# Patient Record
Sex: Female | Born: 1935
Health system: Southern US, Community
[De-identification: ages and names within clinical notes are randomized; demographics above are authoritative.]

## PROBLEM LIST (undated history)

## (undated) DIAGNOSIS — Z87898 Personal history of other specified conditions: Secondary | ICD-10-CM

## (undated) DIAGNOSIS — I10 Essential (primary) hypertension: Secondary | ICD-10-CM

## (undated) DIAGNOSIS — D696 Thrombocytopenia, unspecified: Secondary | ICD-10-CM

## (undated) DIAGNOSIS — N200 Calculus of kidney: Secondary | ICD-10-CM

## (undated) DIAGNOSIS — C8408 Mycosis fungoides, lymph nodes of multiple sites: Secondary | ICD-10-CM

## (undated) DIAGNOSIS — K2270 Barrett's esophagus without dysplasia: Secondary | ICD-10-CM

## (undated) DIAGNOSIS — D497 Neoplasm of unspecified behavior of endocrine glands and other parts of nervous system: Secondary | ICD-10-CM

## (undated) DIAGNOSIS — L89309 Pressure ulcer of unspecified buttock, unspecified stage: Secondary | ICD-10-CM

## (undated) DIAGNOSIS — H269 Unspecified cataract: Secondary | ICD-10-CM

## (undated) DIAGNOSIS — E785 Hyperlipidemia, unspecified: Secondary | ICD-10-CM

## (undated) HISTORY — DX: Essential (primary) hypertension: I10

## (undated) HISTORY — DX: Thrombocytopenia, unspecified: D69.6

## (undated) HISTORY — DX: Hyperlipidemia, unspecified: E78.5

## (undated) HISTORY — DX: Calculus of kidney: N20.0

## (undated) HISTORY — DX: Barrett's esophagus without dysplasia: K22.70

## (undated) HISTORY — DX: Personal history of other specified conditions: Z87.898

## (undated) HISTORY — PX: TOTAL KNEE ARTHROPLASTY: SHX125

## (undated) HISTORY — PX: OTHER SURGICAL HISTORY: SHX169

## (undated) HISTORY — PX: TONSILLECTOMY: SUR1361

## (undated) HISTORY — DX: Unspecified cataract: H26.9

## (undated) HISTORY — DX: Pressure ulcer of unspecified buttock, unspecified stage: L89.309

## (undated) HISTORY — DX: Neoplasm of unspecified behavior of endocrine glands and other parts of nervous system: D49.7

## (undated) HISTORY — PX: ABDOMINAL HYSTERECTOMY: SHX81

## (undated) HISTORY — DX: Mycosis fungoides, lymph nodes of multiple sites: C84.08

---

## 1997-11-07 DIAGNOSIS — K2270 Barrett's esophagus without dysplasia: Secondary | ICD-10-CM

## 1997-11-07 HISTORY — DX: Barrett's esophagus without dysplasia: K22.70

## 1997-12-29 ENCOUNTER — Ambulatory Visit (HOSPITAL_COMMUNITY): Admission: RE | Admit: 1997-12-29 | Discharge: 1997-12-29 | Payer: Self-pay

## 1998-03-04 ENCOUNTER — Inpatient Hospital Stay (HOSPITAL_COMMUNITY): Admission: RE | Admit: 1998-03-04 | Discharge: 1998-03-10 | Payer: Self-pay | Admitting: Orthopedic Surgery

## 1998-03-10 ENCOUNTER — Inpatient Hospital Stay (HOSPITAL_COMMUNITY)
Admission: RE | Admit: 1998-03-10 | Discharge: 1998-03-20 | Payer: Self-pay | Admitting: Physical Medicine and Rehabilitation

## 1998-04-20 ENCOUNTER — Encounter: Admission: RE | Admit: 1998-04-20 | Discharge: 1998-07-19 | Payer: Self-pay | Admitting: Orthopedic Surgery

## 1998-05-28 ENCOUNTER — Ambulatory Visit (HOSPITAL_COMMUNITY): Admission: RE | Admit: 1998-05-28 | Discharge: 1998-05-28 | Payer: Self-pay | Admitting: *Deleted

## 1998-12-20 ENCOUNTER — Emergency Department (HOSPITAL_COMMUNITY): Admission: EM | Admit: 1998-12-20 | Discharge: 1998-12-20 | Payer: Self-pay | Admitting: Emergency Medicine

## 1998-12-20 ENCOUNTER — Encounter: Payer: Self-pay | Admitting: *Deleted

## 1999-02-25 ENCOUNTER — Ambulatory Visit (HOSPITAL_COMMUNITY): Admission: RE | Admit: 1999-02-25 | Discharge: 1999-02-25 | Payer: Self-pay

## 1999-05-31 ENCOUNTER — Encounter (INDEPENDENT_AMBULATORY_CARE_PROVIDER_SITE_OTHER): Payer: Self-pay

## 1999-05-31 ENCOUNTER — Ambulatory Visit (HOSPITAL_COMMUNITY): Admission: RE | Admit: 1999-05-31 | Discharge: 1999-05-31 | Payer: Self-pay | Admitting: *Deleted

## 2000-01-13 ENCOUNTER — Ambulatory Visit (HOSPITAL_BASED_OUTPATIENT_CLINIC_OR_DEPARTMENT_OTHER): Admission: RE | Admit: 2000-01-13 | Discharge: 2000-01-13 | Payer: Self-pay | Admitting: Orthopedic Surgery

## 2000-03-14 ENCOUNTER — Ambulatory Visit (HOSPITAL_COMMUNITY): Admission: RE | Admit: 2000-03-14 | Discharge: 2000-03-14 | Payer: Self-pay | Admitting: Orthopedic Surgery

## 2000-03-24 ENCOUNTER — Encounter: Admission: RE | Admit: 2000-03-24 | Discharge: 2000-03-24 | Payer: Self-pay | Admitting: *Deleted

## 2000-03-24 ENCOUNTER — Encounter: Admission: RE | Admit: 2000-03-24 | Discharge: 2000-03-24 | Payer: Self-pay | Admitting: Orthopedic Surgery

## 2000-03-24 ENCOUNTER — Encounter: Payer: Self-pay | Admitting: Orthopedic Surgery

## 2000-03-24 ENCOUNTER — Encounter: Payer: Self-pay | Admitting: *Deleted

## 2000-04-03 ENCOUNTER — Encounter: Payer: Self-pay | Admitting: Obstetrics and Gynecology

## 2000-04-03 ENCOUNTER — Encounter: Admission: RE | Admit: 2000-04-03 | Discharge: 2000-04-03 | Payer: Self-pay | Admitting: Obstetrics and Gynecology

## 2000-04-12 ENCOUNTER — Encounter (INDEPENDENT_AMBULATORY_CARE_PROVIDER_SITE_OTHER): Payer: Self-pay | Admitting: Specialist

## 2000-04-12 ENCOUNTER — Ambulatory Visit (HOSPITAL_COMMUNITY): Admission: RE | Admit: 2000-04-12 | Discharge: 2000-04-12 | Payer: Self-pay | Admitting: *Deleted

## 2000-11-07 DIAGNOSIS — D497 Neoplasm of unspecified behavior of endocrine glands and other parts of nervous system: Secondary | ICD-10-CM

## 2000-11-07 HISTORY — DX: Neoplasm of unspecified behavior of endocrine glands and other parts of nervous system: D49.7

## 2001-03-19 ENCOUNTER — Ambulatory Visit (HOSPITAL_COMMUNITY): Admission: RE | Admit: 2001-03-19 | Discharge: 2001-03-19 | Payer: Self-pay

## 2002-03-07 ENCOUNTER — Ambulatory Visit (HOSPITAL_COMMUNITY): Admission: RE | Admit: 2002-03-07 | Discharge: 2002-03-07 | Payer: Self-pay | Admitting: *Deleted

## 2002-03-07 ENCOUNTER — Encounter (INDEPENDENT_AMBULATORY_CARE_PROVIDER_SITE_OTHER): Payer: Self-pay | Admitting: Specialist

## 2002-03-07 HISTORY — PX: ESOPHAGOGASTRODUODENOSCOPY: SHX1529

## 2002-03-21 ENCOUNTER — Ambulatory Visit (HOSPITAL_COMMUNITY): Admission: RE | Admit: 2002-03-21 | Discharge: 2002-03-21 | Payer: Self-pay | Admitting: *Deleted

## 2002-05-03 ENCOUNTER — Encounter: Payer: Self-pay | Admitting: Internal Medicine

## 2002-05-03 ENCOUNTER — Encounter: Admission: RE | Admit: 2002-05-03 | Discharge: 2002-05-03 | Payer: Self-pay | Admitting: Internal Medicine

## 2002-05-13 ENCOUNTER — Ambulatory Visit (HOSPITAL_BASED_OUTPATIENT_CLINIC_OR_DEPARTMENT_OTHER): Admission: RE | Admit: 2002-05-13 | Discharge: 2002-05-13 | Payer: Self-pay | Admitting: *Deleted

## 2002-06-06 ENCOUNTER — Encounter: Admission: RE | Admit: 2002-06-06 | Discharge: 2002-09-04 | Payer: Self-pay | Admitting: Internal Medicine

## 2002-09-18 ENCOUNTER — Encounter: Admission: RE | Admit: 2002-09-18 | Discharge: 2002-12-17 | Payer: Self-pay | Admitting: Internal Medicine

## 2002-12-17 ENCOUNTER — Encounter: Admission: RE | Admit: 2002-12-17 | Discharge: 2003-03-17 | Payer: Self-pay

## 2003-01-13 ENCOUNTER — Encounter: Payer: Self-pay | Admitting: Family Medicine

## 2003-01-13 ENCOUNTER — Encounter: Admission: RE | Admit: 2003-01-13 | Discharge: 2003-01-13 | Payer: Self-pay | Admitting: Family Medicine

## 2003-02-11 ENCOUNTER — Encounter (INDEPENDENT_AMBULATORY_CARE_PROVIDER_SITE_OTHER): Payer: Self-pay | Admitting: Specialist

## 2003-02-11 ENCOUNTER — Ambulatory Visit (HOSPITAL_COMMUNITY): Admission: RE | Admit: 2003-02-11 | Discharge: 2003-02-11 | Payer: Self-pay | Admitting: *Deleted

## 2003-03-28 ENCOUNTER — Ambulatory Visit (HOSPITAL_COMMUNITY): Admission: RE | Admit: 2003-03-28 | Discharge: 2003-03-28 | Payer: Self-pay | Admitting: *Deleted

## 2003-09-23 ENCOUNTER — Ambulatory Visit (HOSPITAL_COMMUNITY): Admission: RE | Admit: 2003-09-23 | Discharge: 2003-09-23 | Payer: Self-pay | Admitting: Emergency Medicine

## 2003-10-07 ENCOUNTER — Ambulatory Visit (HOSPITAL_COMMUNITY): Admission: RE | Admit: 2003-10-07 | Discharge: 2003-10-07 | Payer: Self-pay | Admitting: Emergency Medicine

## 2004-03-15 ENCOUNTER — Ambulatory Visit (HOSPITAL_COMMUNITY): Admission: RE | Admit: 2004-03-15 | Discharge: 2004-03-15 | Payer: Self-pay | Admitting: *Deleted

## 2004-04-23 ENCOUNTER — Encounter: Admission: RE | Admit: 2004-04-23 | Discharge: 2004-04-23 | Payer: Self-pay | Admitting: Family Medicine

## 2004-05-11 ENCOUNTER — Inpatient Hospital Stay (HOSPITAL_BASED_OUTPATIENT_CLINIC_OR_DEPARTMENT_OTHER): Admission: RE | Admit: 2004-05-11 | Discharge: 2004-05-11 | Payer: Self-pay | Admitting: Cardiovascular Disease

## 2004-07-22 ENCOUNTER — Inpatient Hospital Stay (HOSPITAL_COMMUNITY): Admission: RE | Admit: 2004-07-22 | Discharge: 2004-07-30 | Payer: Self-pay | Admitting: Orthopedic Surgery

## 2004-07-30 ENCOUNTER — Ambulatory Visit: Payer: Self-pay | Admitting: Physical Medicine & Rehabilitation

## 2004-07-30 ENCOUNTER — Inpatient Hospital Stay
Admission: RE | Admit: 2004-07-30 | Discharge: 2004-08-11 | Payer: Self-pay | Admitting: Physical Medicine & Rehabilitation

## 2004-09-20 ENCOUNTER — Ambulatory Visit: Payer: Self-pay | Admitting: Family Medicine

## 2004-09-21 ENCOUNTER — Ambulatory Visit: Payer: Self-pay | Admitting: Family Medicine

## 2004-09-27 ENCOUNTER — Ambulatory Visit: Payer: Self-pay | Admitting: Family Medicine

## 2004-09-29 ENCOUNTER — Encounter: Admission: RE | Admit: 2004-09-29 | Discharge: 2004-11-05 | Payer: Self-pay | Admitting: Orthopedic Surgery

## 2004-10-14 ENCOUNTER — Ambulatory Visit: Payer: Self-pay | Admitting: Family Medicine

## 2004-11-03 ENCOUNTER — Ambulatory Visit: Payer: Self-pay | Admitting: Family Medicine

## 2005-01-11 ENCOUNTER — Ambulatory Visit: Payer: Self-pay | Admitting: Family Medicine

## 2005-05-11 ENCOUNTER — Ambulatory Visit: Payer: Self-pay | Admitting: Family Medicine

## 2005-05-23 ENCOUNTER — Ambulatory Visit (HOSPITAL_COMMUNITY): Admission: RE | Admit: 2005-05-23 | Discharge: 2005-05-23 | Payer: Self-pay | Admitting: Family Medicine

## 2005-06-02 ENCOUNTER — Encounter: Admission: RE | Admit: 2005-06-02 | Discharge: 2005-06-02 | Payer: Self-pay | Admitting: Family Medicine

## 2005-07-21 ENCOUNTER — Ambulatory Visit: Payer: Self-pay | Admitting: Family Medicine

## 2005-08-30 ENCOUNTER — Ambulatory Visit: Payer: Self-pay | Admitting: Family Medicine

## 2005-09-15 ENCOUNTER — Ambulatory Visit: Payer: Self-pay | Admitting: Family Medicine

## 2005-09-19 ENCOUNTER — Ambulatory Visit: Payer: Self-pay | Admitting: Physical Medicine & Rehabilitation

## 2005-09-19 ENCOUNTER — Ambulatory Visit: Payer: Self-pay | Admitting: Infectious Diseases

## 2005-09-19 ENCOUNTER — Inpatient Hospital Stay (HOSPITAL_COMMUNITY): Admission: RE | Admit: 2005-09-19 | Discharge: 2005-10-03 | Payer: Self-pay | Admitting: Orthopedic Surgery

## 2005-10-07 ENCOUNTER — Ambulatory Visit: Payer: Self-pay | Admitting: Family Medicine

## 2005-10-27 ENCOUNTER — Ambulatory Visit: Payer: Self-pay | Admitting: Family Medicine

## 2006-01-05 ENCOUNTER — Ambulatory Visit: Payer: Self-pay | Admitting: Infectious Diseases

## 2006-01-30 ENCOUNTER — Ambulatory Visit: Payer: Self-pay | Admitting: Infectious Diseases

## 2006-08-08 ENCOUNTER — Ambulatory Visit: Payer: Self-pay | Admitting: Family Medicine

## 2006-08-21 ENCOUNTER — Ambulatory Visit: Payer: Self-pay | Admitting: Family Medicine

## 2007-07-26 ENCOUNTER — Telehealth: Payer: Self-pay | Admitting: Family Medicine

## 2007-09-25 ENCOUNTER — Telehealth (INDEPENDENT_AMBULATORY_CARE_PROVIDER_SITE_OTHER): Payer: Self-pay | Admitting: *Deleted

## 2007-10-02 ENCOUNTER — Telehealth (INDEPENDENT_AMBULATORY_CARE_PROVIDER_SITE_OTHER): Payer: Self-pay | Admitting: *Deleted

## 2008-02-12 ENCOUNTER — Ambulatory Visit: Payer: Self-pay | Admitting: Family Medicine

## 2008-02-12 DIAGNOSIS — E785 Hyperlipidemia, unspecified: Secondary | ICD-10-CM

## 2008-02-12 DIAGNOSIS — I110 Hypertensive heart disease with heart failure: Secondary | ICD-10-CM

## 2008-02-17 ENCOUNTER — Encounter (INDEPENDENT_AMBULATORY_CARE_PROVIDER_SITE_OTHER): Payer: Self-pay | Admitting: *Deleted

## 2008-03-25 ENCOUNTER — Encounter: Payer: Self-pay | Admitting: Family Medicine

## 2008-04-03 ENCOUNTER — Ambulatory Visit (HOSPITAL_COMMUNITY): Admission: RE | Admit: 2008-04-03 | Discharge: 2008-04-03 | Payer: Self-pay | Admitting: *Deleted

## 2008-07-01 ENCOUNTER — Telehealth (INDEPENDENT_AMBULATORY_CARE_PROVIDER_SITE_OTHER): Payer: Self-pay | Admitting: *Deleted

## 2008-07-04 ENCOUNTER — Telehealth: Payer: Self-pay | Admitting: Family Medicine

## 2008-07-04 ENCOUNTER — Encounter (INDEPENDENT_AMBULATORY_CARE_PROVIDER_SITE_OTHER): Payer: Self-pay | Admitting: *Deleted

## 2008-11-04 ENCOUNTER — Telehealth (INDEPENDENT_AMBULATORY_CARE_PROVIDER_SITE_OTHER): Payer: Self-pay | Admitting: *Deleted

## 2008-11-10 ENCOUNTER — Telehealth (INDEPENDENT_AMBULATORY_CARE_PROVIDER_SITE_OTHER): Payer: Self-pay | Admitting: *Deleted

## 2009-01-14 ENCOUNTER — Telehealth (INDEPENDENT_AMBULATORY_CARE_PROVIDER_SITE_OTHER): Payer: Self-pay | Admitting: *Deleted

## 2009-01-16 ENCOUNTER — Ambulatory Visit: Payer: Self-pay | Admitting: Family Medicine

## 2009-01-16 DIAGNOSIS — N393 Stress incontinence (female) (male): Secondary | ICD-10-CM | POA: Insufficient documentation

## 2009-01-20 ENCOUNTER — Ambulatory Visit: Payer: Self-pay | Admitting: Family Medicine

## 2009-01-20 LAB — CONVERTED CEMR LAB
AST: 20 units/L (ref 0–37)
Bilirubin, Direct: 0 mg/dL (ref 0.0–0.3)
Cholesterol: 121 mg/dL (ref 0–200)
LDL Cholesterol: 61 mg/dL (ref 0–99)
Total Protein: 7.2 g/dL (ref 6.0–8.3)
VLDL: 20.4 mg/dL (ref 0.0–40.0)

## 2009-01-21 ENCOUNTER — Telehealth: Payer: Self-pay | Admitting: Family Medicine

## 2009-01-21 ENCOUNTER — Encounter (INDEPENDENT_AMBULATORY_CARE_PROVIDER_SITE_OTHER): Payer: Self-pay | Admitting: *Deleted

## 2009-02-10 ENCOUNTER — Telehealth (INDEPENDENT_AMBULATORY_CARE_PROVIDER_SITE_OTHER): Payer: Self-pay | Admitting: *Deleted

## 2009-02-12 ENCOUNTER — Encounter: Payer: Self-pay | Admitting: Family Medicine

## 2009-03-05 ENCOUNTER — Telehealth (INDEPENDENT_AMBULATORY_CARE_PROVIDER_SITE_OTHER): Payer: Self-pay | Admitting: *Deleted

## 2009-03-11 ENCOUNTER — Encounter: Payer: Self-pay | Admitting: Family Medicine

## 2009-03-27 ENCOUNTER — Encounter: Payer: Self-pay | Admitting: Family Medicine

## 2009-04-23 ENCOUNTER — Encounter: Payer: Self-pay | Admitting: Family Medicine

## 2009-05-28 ENCOUNTER — Telehealth (INDEPENDENT_AMBULATORY_CARE_PROVIDER_SITE_OTHER): Payer: Self-pay | Admitting: *Deleted

## 2009-09-15 ENCOUNTER — Encounter: Payer: Self-pay | Admitting: Family Medicine

## 2009-09-21 ENCOUNTER — Ambulatory Visit: Payer: Self-pay | Admitting: Family Medicine

## 2009-09-21 DIAGNOSIS — M25519 Pain in unspecified shoulder: Secondary | ICD-10-CM | POA: Insufficient documentation

## 2009-09-21 DIAGNOSIS — C8408 Mycosis fungoides, lymph nodes of multiple sites: Secondary | ICD-10-CM

## 2009-09-21 DIAGNOSIS — M1710 Unilateral primary osteoarthritis, unspecified knee: Secondary | ICD-10-CM

## 2009-09-21 HISTORY — DX: Mycosis fungoides, lymph nodes of multiple sites: C84.08

## 2009-09-28 ENCOUNTER — Ambulatory Visit: Payer: Self-pay | Admitting: Family Medicine

## 2009-09-28 LAB — CONVERTED CEMR LAB
OCCULT 1: NEGATIVE
OCCULT 2: NEGATIVE

## 2009-10-07 ENCOUNTER — Telehealth (INDEPENDENT_AMBULATORY_CARE_PROVIDER_SITE_OTHER): Payer: Self-pay | Admitting: *Deleted

## 2009-10-12 ENCOUNTER — Ambulatory Visit: Payer: Self-pay | Admitting: Family Medicine

## 2009-10-14 ENCOUNTER — Encounter (INDEPENDENT_AMBULATORY_CARE_PROVIDER_SITE_OTHER): Payer: Self-pay | Admitting: *Deleted

## 2009-10-14 LAB — CONVERTED CEMR LAB
Eosinophils Absolute: 0.1 10*3/uL (ref 0.0–0.7)
Eosinophils Relative: 1.9 % (ref 0.0–5.0)
Hemoglobin: 14.2 g/dL (ref 12.0–15.0)
Lymphs Abs: 1.6 10*3/uL (ref 0.7–4.0)
MCHC: 33.5 g/dL (ref 30.0–36.0)
Monocytes Relative: 3 % (ref 3.0–12.0)
Neutro Abs: 4.3 10*3/uL (ref 1.4–7.7)
Neutrophils Relative %: 69.3 % (ref 43.0–77.0)
Platelets: 138 10*3/uL — ABNORMAL LOW (ref 150.0–400.0)

## 2009-12-21 ENCOUNTER — Ambulatory Visit: Payer: Self-pay | Admitting: Family Medicine

## 2009-12-21 DIAGNOSIS — D696 Thrombocytopenia, unspecified: Secondary | ICD-10-CM | POA: Insufficient documentation

## 2009-12-21 DIAGNOSIS — L89309 Pressure ulcer of unspecified buttock, unspecified stage: Secondary | ICD-10-CM

## 2009-12-21 DIAGNOSIS — B370 Candidal stomatitis: Secondary | ICD-10-CM

## 2009-12-21 HISTORY — DX: Thrombocytopenia, unspecified: D69.6

## 2009-12-21 HISTORY — DX: Pressure ulcer of unspecified buttock, unspecified stage: L89.309

## 2009-12-22 ENCOUNTER — Telehealth (INDEPENDENT_AMBULATORY_CARE_PROVIDER_SITE_OTHER): Payer: Self-pay | Admitting: *Deleted

## 2009-12-23 LAB — CONVERTED CEMR LAB
Basophils Absolute: 0.2 10*3/uL — ABNORMAL HIGH (ref 0.0–0.1)
Eosinophils Absolute: 0.2 10*3/uL (ref 0.0–0.7)
Eosinophils Relative: 3.5 % (ref 0.0–5.0)
Hemoglobin: 14.1 g/dL (ref 12.0–15.0)
Lymphs Abs: 1.4 10*3/uL (ref 0.7–4.0)
Monocytes Absolute: 0.3 10*3/uL (ref 0.1–1.0)
RBC: 4.4 M/uL (ref 3.87–5.11)
WBC: 4.8 10*3/uL (ref 4.5–10.5)

## 2009-12-24 ENCOUNTER — Encounter (HOSPITAL_BASED_OUTPATIENT_CLINIC_OR_DEPARTMENT_OTHER): Admission: RE | Admit: 2009-12-24 | Discharge: 2010-01-12 | Payer: Self-pay | Admitting: Internal Medicine

## 2009-12-28 ENCOUNTER — Ambulatory Visit: Payer: Self-pay | Admitting: Hematology & Oncology

## 2010-01-18 ENCOUNTER — Encounter: Payer: Self-pay | Admitting: Family Medicine

## 2010-01-18 LAB — CBC WITH DIFFERENTIAL (CANCER CENTER ONLY)
BASO#: 0 10*3/uL (ref 0.0–0.2)
BASO%: 0.7 % (ref 0.0–2.0)
EOS%: 3.5 % (ref 0.0–7.0)
Eosinophils Absolute: 0.2 10*3/uL (ref 0.0–0.5)
HCT: 41.9 % (ref 34.8–46.6)
LYMPH#: 1.5 10*3/uL (ref 0.9–3.3)
LYMPH%: 31.3 % (ref 14.0–48.0)
MCHC: 32.6 g/dL (ref 32.0–36.0)
MCV: 97 fL (ref 81–101)
MONO#: 0.5 10*3/uL (ref 0.1–0.9)
NEUT%: 53.5 % (ref 39.6–80.0)
Platelets: 131 10*3/uL — ABNORMAL LOW (ref 145–400)
RDW: 11.3 % (ref 10.5–14.6)

## 2010-01-18 LAB — CHCC SATELLITE - SMEAR

## 2010-01-20 LAB — VITAMIN B12: Vitamin B-12: 1037 pg/mL — ABNORMAL HIGH (ref 211–911)

## 2010-01-20 LAB — PROTEIN ELECTROPHORESIS, SERUM
Beta Globulin: 6.4 % (ref 4.7–7.2)
Gamma Globulin: 17 % (ref 11.1–18.8)

## 2010-07-13 ENCOUNTER — Ambulatory Visit: Payer: Self-pay | Admitting: Hematology & Oncology

## 2010-07-15 ENCOUNTER — Encounter: Payer: Self-pay | Admitting: Family Medicine

## 2010-07-15 LAB — CBC WITH DIFFERENTIAL (CANCER CENTER ONLY)
BASO#: 0.1 10*3/uL (ref 0.0–0.2)
HGB: 14.5 g/dL (ref 11.6–15.9)
LYMPH%: 29.9 % (ref 14.0–48.0)
NEUT#: 3.5 10*3/uL (ref 1.5–6.5)
WBC: 6.4 10*3/uL (ref 3.9–10.0)

## 2010-07-15 LAB — CHCC SATELLITE - SMEAR

## 2010-09-23 ENCOUNTER — Ambulatory Visit: Payer: Self-pay | Admitting: Family Medicine

## 2010-09-23 ENCOUNTER — Encounter: Payer: Self-pay | Admitting: Family Medicine

## 2010-09-23 LAB — CONVERTED CEMR LAB
Bilirubin Urine: NEGATIVE
Blood in Urine, dipstick: NEGATIVE
Glucose, Urine, Semiquant: NEGATIVE
Ketones, urine, test strip: NEGATIVE
Urobilinogen, UA: NEGATIVE
pH: 6.5

## 2010-09-27 LAB — CONVERTED CEMR LAB
ALT: 18 units/L (ref 0–35)
AST: 21 units/L (ref 0–37)
Alkaline Phosphatase: 52 units/L (ref 39–117)
BUN: 19 mg/dL (ref 6–23)
Basophils Relative: 0.9 % (ref 0.0–3.0)
Bilirubin, Direct: 0.1 mg/dL (ref 0.0–0.3)
Calcium: 9 mg/dL (ref 8.4–10.5)
Creatinine, Ser: 0.7 mg/dL (ref 0.4–1.2)
Eosinophils Absolute: 0.1 10*3/uL (ref 0.0–0.7)
Eosinophils Relative: 2.6 % (ref 0.0–5.0)
GFR calc non Af Amer: 85.97 mL/min (ref >60)
Glucose, Bld: 63 mg/dL — ABNORMAL LOW (ref 70–99)
HCT: 41.5 % (ref 36.0–46.0)
HDL: 46.8 mg/dL (ref >39.00)
Lymphocytes Relative: 28.6 % (ref 12.0–46.0)
Lymphs Abs: 1.6 10*3/uL (ref 0.7–4.0)
Neutro Abs: 3.1 10*3/uL (ref 1.4–7.7)
Platelets: 152 10*3/uL (ref 150.0–400.0)
Potassium: 3.6 meq/L (ref 3.5–5.1)
RBC: 4.25 M/uL (ref 3.87–5.11)
Sodium: 141 meq/L (ref 135–145)
Total CHOL/HDL Ratio: 3
Total Protein: 6.6 g/dL (ref 6.0–8.3)

## 2010-11-11 ENCOUNTER — Ambulatory Visit
Admission: RE | Admit: 2010-11-11 | Discharge: 2010-11-11 | Payer: Self-pay | Source: Home / Self Care | Attending: Family Medicine | Admitting: Family Medicine

## 2010-11-11 DIAGNOSIS — J019 Acute sinusitis, unspecified: Secondary | ICD-10-CM | POA: Insufficient documentation

## 2010-11-25 ENCOUNTER — Telehealth (INDEPENDENT_AMBULATORY_CARE_PROVIDER_SITE_OTHER): Payer: Self-pay | Admitting: *Deleted

## 2010-11-28 ENCOUNTER — Encounter: Payer: Self-pay | Admitting: Family Medicine

## 2010-12-05 LAB — CONVERTED CEMR LAB
ALT: 17 units/L (ref 0–35)
ALT: 19 units/L (ref 0–35)
AST: 21 units/L (ref 0–37)
AST: 21 units/L (ref 0–37)
Alkaline Phosphatase: 56 units/L (ref 39–117)
BUN: 10 mg/dL (ref 6–23)
BUN: 19 mg/dL (ref 6–23)
Basophils Absolute: 0 10*3/uL (ref 0.0–0.1)
Bilirubin Urine: NEGATIVE
Bilirubin, Direct: 0 mg/dL (ref 0.0–0.3)
Bilirubin, Direct: 0.1 mg/dL (ref 0.0–0.3)
CO2: 32 meq/L (ref 19–32)
Calcium: 9.2 mg/dL (ref 8.4–10.5)
Chloride: 99 meq/L (ref 96–112)
Creatinine, Ser: 0.8 mg/dL (ref 0.4–1.2)
Eosinophils Absolute: 0.1 10*3/uL (ref 0.0–0.7)
GFR calc non Af Amer: 73.89 mL/min (ref >60)
Glucose, Bld: 82 mg/dL (ref 70–99)
Glucose, Bld: 87 mg/dL (ref 70–99)
Glucose, Urine, Semiquant: NEGATIVE
HCT: 43.4 % (ref 36.0–46.0)
HDL: 46 mg/dL (ref >39.00)
Hemoglobin: 14.3 g/dL (ref 12.0–15.0)
Hemoglobin: 15.5 g/dL — ABNORMAL HIGH (ref 12.0–15.0)
Ketones, urine, test strip: NEGATIVE
LDL Cholesterol: 71 mg/dL (ref 0–99)
Lymphocytes Relative: 25.8 % (ref 12.0–46.0)
Lymphs Abs: 1.4 10*3/uL (ref 0.7–4.0)
MCV: 100.2 fL — ABNORMAL HIGH (ref 78.0–100.0)
MCV: 97.6 fL (ref 78.0–100.0)
Monocytes Absolute: 0.8 10*3/uL (ref 0.1–1.0)
Neutro Abs: 3.8 10*3/uL (ref 1.4–7.7)
Nitrite: NEGATIVE
Platelets: 124 10*3/uL — ABNORMAL LOW (ref 150.0–400.0)
Potassium: 3.6 meq/L (ref 3.5–5.1)
Protein, U semiquant: NEGATIVE
RBC: 4.33 M/uL (ref 3.87–5.11)
RBC: 4.95 M/uL (ref 3.87–5.11)
RDW: 12.8 % (ref 11.5–14.6)
Sodium: 142 meq/L (ref 135–145)
TSH: 1.75 microintl units/mL (ref 0.35–5.50)
Total Bilirubin: 0.8 mg/dL (ref 0.3–1.2)
Total CHOL/HDL Ratio: 3
Total Protein: 7.8 g/dL (ref 6.0–8.3)
Triglycerides: 106 mg/dL (ref 0–149)
Triglycerides: 121 mg/dL (ref 0.0–149.0)
VLDL: 21 mg/dL (ref 0–40)
WBC Urine, dipstick: NEGATIVE
WBC: 5.4 10*3/uL (ref 4.5–10.5)

## 2010-12-06 ENCOUNTER — Telehealth (INDEPENDENT_AMBULATORY_CARE_PROVIDER_SITE_OTHER): Payer: Self-pay | Admitting: *Deleted

## 2010-12-09 NOTE — Assessment & Plan Note (Signed)
Summary: f/u and check platelets   Vital Signs:  Patient profile:   77 year old female Height:      63 inches Weight:      256 pounds BMI:     45.51 Temp:     98.1 degrees F oral Pulse rate:   82 / minute Pulse rhythm:   regular BP sitting:   124 / 82  (left arm) Cuff size:   large  Vitals Entered By: Army Fossa CMA (December 21, 2009 4:10 PM)  History of Present Illness: Pt here to have platlets rechecked. Derm at [facility] told her he could tell her platlets were low because when he did the biopsy she bled so much.   Pt told the sore on her buttocks was a "bed sore" .  Pt is frustrated because she has been on abx and seen 2 derm and it is not healing and it is painful Pt also c/o painful gums and tongue esp after new dentures.d She also needs refill on pain meds for OA.  Current Medications (verified): 1)  Zocor 80 Mg Tabs (Simvastatin) .... Take 1 Tablet By Mouth Once A Day 2)  Lasix 40 Mg Tabs (Furosemide) .... Take Two and One Half Tablets Daily 3)  Potassium Chloride Crys Cr 20 Meq Tbcr (Potassium Chloride Crys Cr) .Marland Kitchen.. 1 By Mouth Two Times A Day 4)  Metoprolol Tartrate 50 Mg Tabs (Metoprolol Tartrate) .Marland Kitchen.. 1 By Mouth Two Times A Day 5)  Glucosamine-Chondroitin 1500-1200 Mg/7ml  Liqd (Glucosamine-Chondroitin) .... Three Times Daily 6)  Hydrocodone-Acetaminophen 5-500 Mg  Tabs (Hydrocodone-Acetaminophen) .... Take One Tablet Twice Daily As Needed. 7)  Multivitamins  Caps (Multiple Vitamin) .... One Daily 8)  Dukes Magic Mouthwash .... 5 Ml By Mouth Qid Swish and Spit  Allergies: 1)  ! Codeine  Past History:  Past medical, surgical, family and social histories (including risk factors) reviewed for relevance to current acute and chronic problems.  Past Medical History: Reviewed history from 02/12/2008 and no changes required. Hyperlipidemia Hypertension 2002-h/o pylori-treated by Dr. Virginia Rochester cataracts-unilateral lesion right UE x 3 yrs-[facility] 2001-h/o abnormal mammo-told  just calcium deposit-repeat 2002- Right adrenal tumor-followed by surgery obesity LE Edema RLQ pain-chronic-episodic hypersomneolea 1996-HTN h/o Left kidney stone 1999-Barrett's esophagitis-discovered form blood loss  Past Surgical History: Reviewed history from 02/12/2008 and no changes required. 1999-Left knee replacement 03/2002-EGD-lst one 2003 2003-MRI of renal to f/u adrenal Tonsillectomy Appendectomy 1976-Hysterectomy-fibroid, before that BTL 1966 1980-U/s right ovary cyst-laparoscopic showed ahesions and not cyst on ovary 2003-sleep evaluation teeth extractions 11/6-septic arthritis 11/2004-total right knee replacement  Family History: Reviewed history from 02/12/2008 and no changes required. Family History of CAD Female 1st degree relative <72 M Family History of CAD Female 1st degree relative <60 F Family History of Stroke F 1st degree relative <60  Social History: Reviewed history from 02/12/2008 and no changes required. Married Never Smoked Alcohol use-no Drug use-no Regular exercise-no  Review of Systems      See HPI  Physical Exam  General:  Well-developed,well-nourished,in no acute distress; alert,appropriate and cooperative throughout examination Mouth:  tongue red and gums sore Skin:  L buttock---  stage 1 decubitus ulcer about 1 1/2 in long and 1 inch wide ,  place where bx done healing.   Psych:  Oriented X3 and normally interactive.     Impression & Recommendations:  Problem # 1:  THROMBOCYTOPENIA (ICD-287.5) refer to hematology if con't to drop Orders: TLB-CBC Platelet - w/Differential (85025-CBCD) Venipuncture (65784)  Problem # 2:  DECUBITUS ULCER, BUTTOCK (ICD-707.05) Assessment: Unchanged  Pt states it has been ongoing since September and will not heal./ She has been on cipro and saw 2 dermatologists---pt is requesting wound center because of the pain  Orders: Wound Care Center Referral (Wound Care)  Problem # 3:  DEGENERATIVE  JOINT DISEASE, KNEE (ICD-715.96)  Her updated medication list for this problem includes:    Hydrocodone-acetaminophen 5-500 Mg Tabs (Hydrocodone-acetaminophen) .Marland Kitchen... Take one tablet twice daily as needed.  Discussed strengthening exercises, use of ice or heat, and medications.   Problem # 4:  THRUSH (ICD-112.0) dukes magic mouthwash  Complete Medication List: 1)  Zocor 80 Mg Tabs (Simvastatin) .... Take 1 tablet by mouth once a day 2)  Lasix 40 Mg Tabs (Furosemide) .... Take two and one half tablets daily 3)  Potassium Chloride Crys Cr 20 Meq Tbcr (Potassium chloride crys cr) .Marland Kitchen.. 1 by mouth two times a day 4)  Metoprolol Tartrate 50 Mg Tabs (Metoprolol tartrate) .Marland Kitchen.. 1 by mouth two times a day 5)  Glucosamine-chondroitin 1500-1200 Mg/53ml Liqd (Glucosamine-chondroitin) .... Three times daily 6)  Hydrocodone-acetaminophen 5-500 Mg Tabs (Hydrocodone-acetaminophen) .... Take one tablet twice daily as needed. 7)  Multivitamins Caps (Multiple vitamin) .... One daily 8)  Dukes Magic Mouthwash  .... 5 ml by mouth qid swish and spit Prescriptions: DUKES MAGIC MOUTHWASH 5 ml by mouth qid swish and spit  #150 cc x 0   Entered and Authorized by:   Loreen Freud DO   Signed by:   Loreen Freud DO on 12/21/2009   Method used:   Print then Give to Patient   RxID:   [id number] HYDROCODONE-ACETAMINOPHEN 5-500 MG  TABS (HYDROCODONE-ACETAMINOPHEN) Take one tablet twice daily as needed.  #60 x 0   Entered and Authorized by:   Loreen Freud DO   Signed by:   Loreen Freud DO on 12/21/2009   Method used:   Print then Give to Patient   RxID:   [id number]

## 2010-12-09 NOTE — Consult Note (Signed)
[facility]   Imported By: Lanelle Bal 01/28/2010 12:04:35  _____________________________________________________________________  External Attachment:    Type:   Image     Comment:   External Document

## 2010-12-09 NOTE — Assessment & Plan Note (Signed)
Summary: EST MEDICARE//YEARLY//PH   Vital Signs:  Patient profile:   77 year old female Height:      63 inches Weight:      264.4 pounds BMI:     47.01 Temp:     98.1 degrees F oral Pulse rate:   56 / minute Pulse rhythm:   regular BP sitting:   120 / 76  (right arm)  Vitals Entered By: Almeta Monas CMA Duncan Dull) (September 23, 2010 8:43 AM) CC: CPX/Fasting-- Per pt sores in her mouth are not getting better with Mouth wash, also started Valtrex with no relief Is Patient Diabetic? No  Does patient need assistance? Functional Status Self care, Social activities Ambulation Impaired:Risk for fall Comments + rolling walker pt is able to cook but her husband does the cleaning pt is able to read and write  Vision Screening:      Vision Comments: optho q1y 40db HL: Left  Right  Audiometry Comment: grossly normal    History of Present Illness: Pt here for cpe.  Pt seeing wound center for sores on bottom and still seeing oncology.  Pt seeing ortho for her knees but she doesn't want surgery right now.  oncology--dr Ennaver ortho- Dr Lajoyce Corners  optho-- seen q1y-- pt can't remember name podiatrist ---q1y --can't remember name derm---  "   Preventive Screening-Counseling & Management  Alcohol-Tobacco     Alcohol drinks/day: 0     Smoking Status: never     Passive Smoke Exposure: no  Caffeine-Diet-Exercise     Caffeine use/day: 2     Does Patient Exercise: no  Hep-HIV-STD-Contraception     HIV Risk: no     Dental Visit-last 6 months yes     Dental Care Counseling: not indicated; dental care within six months  Safety-Violence-Falls     Seat Belt Use: 100     Firearms in the Home: no firearms in the home     Smoke Detectors: yes     Violence in the Home: no risk noted     Sexual Abuse: no     Fall Risk: yes     Fall Risk Counseling: counseling provided; falls with injury noted      Sexual History:  currently monogamous.    Current Medications (verified): 1)  Zocor 80  Mg Tabs (Simvastatin) .... Take 1 Tablet By Mouth Once A Day 2)  Lasix 40 Mg Tabs (Furosemide) .... Take Two and One Half Tablets Daily 3)  Potassium Chloride Crys Cr 20 Meq Tbcr (Potassium Chloride Crys Cr) .Marland Kitchen.. 1 By Mouth Two Times A Day 4)  Metoprolol Tartrate 50 Mg Tabs (Metoprolol Tartrate) .Marland Kitchen.. 1 By Mouth Two Times A Day 5)  Glucosamine-Chondroitin 1500-1200 Mg/105ml  Liqd (Glucosamine-Chondroitin) .... Three Times Daily 6)  Hydrocodone-Acetaminophen 5-500 Mg  Tabs (Hydrocodone-Acetaminophen) .... Take One Tablet Twice Daily As Needed. 7)  Multivitamins  Caps (Multiple Vitamin) .... One Daily 8)  Dukes Magic Mouthwash .... 5 Ml By Mouth Qid Swish and Spit 9)  Valtrex 1 Gm Tabs (Valacyclovir Hcl) .... By Mouth Once Daily 10)  Zostavax 04540 Unt/0.23ml Solr (Zoster Vaccine Live) .Marland Kitchen.. 1 Ml Im X1  Allergies (verified): 1)  ! Codeine  Past History:  Past Medical History: Last updated: 02/12/2008 Hyperlipidemia Hypertension 2002-h/o pylori-treated by Dr. Virginia Rochester cataracts-unilateral lesion right UE x 3 yrs-[facility] 2001-h/o abnormal mammo-told just calcium deposit-repeat 2002- Right adrenal tumor-followed by surgery obesity LE Edema RLQ pain-chronic-episodic hypersomneolea 1996-HTN h/o Left kidney stone 1999-Barrett's esophagitis-discovered form blood loss  Past Surgical  History: Last updated: 02/12/2008 1999-Left knee replacement 03/2002-EGD-lst one 2003 2003-MRI of renal to f/u adrenal Tonsillectomy Appendectomy 1976-Hysterectomy-fibroid, before that BTL 1966 1980-U/s right ovary cyst-laparoscopic showed ahesions and not cyst on ovary 2003-sleep evaluation teeth extractions 11/6-septic arthritis 11/2004-total right knee replacement  Family History: Last updated: 02/12/2008 Family History of CAD Female 1st degree relative <72 M Family History of CAD Female 1st degree relative <25 F Family History of Stroke F 1st degree relative <60  Social History: Last updated:  02/12/2008 Married Never Smoked Alcohol use-no Drug use-no Regular exercise-no  Risk Factors: Alcohol Use: 0 (09/23/2010) Caffeine Use: 2 (09/23/2010) Exercise: no (09/23/2010)  Risk Factors: Smoking Status: never (09/23/2010) Passive Smoke Exposure: no (09/23/2010)  Family History: Reviewed history from 02/12/2008 and no changes required. Family History of CAD Female 1st degree relative <72 M Family History of CAD Female 1st degree relative <60 F Family History of Stroke F 1st degree relative <60  Social History: Reviewed history from 02/12/2008 and no changes required. Married Never Smoked Alcohol use-no Drug use-no Regular exercise-no Fall Risk:  yes  Review of Systems      See HPI General:  Denies chills, fatigue, fever, loss of appetite, malaise, sleep disorder, sweats, weakness, and weight loss. Eyes:  Denies blurring, discharge, double vision, eye irritation, eye pain, halos, itching, light sensitivity, red eye, vision loss-1 eye, and vision loss-both eyes. ENT:  Denies decreased hearing, difficulty swallowing, ear discharge, earache, hoarseness, nasal congestion, nosebleeds, postnasal drainage, ringing in ears, sinus pressure, and sore throat. CV:  Denies bluish discoloration of lips or nails, chest pain or discomfort, difficulty breathing at night, difficulty breathing while lying down, fainting, fatigue, leg cramps with exertion, lightheadness, near fainting, palpitations, shortness of breath with exertion, swelling of feet, swelling of hands, and weight gain. Resp:  Denies chest discomfort, chest pain with inspiration, cough, coughing up blood, excessive snoring, hypersomnolence, morning headaches, pleuritic, shortness of breath, sputum productive, and wheezing. GI:  Denies abdominal pain, bloody stools, change in bowel habits, constipation, dark tarry stools, diarrhea, excessive appetite, gas, hemorrhoids, indigestion, loss of appetite, nausea, vomiting, vomiting  blood, and yellowish skin color. GU:  Denies abnormal vaginal bleeding, decreased libido, discharge, dysuria, genital sores, hematuria, incontinence, nocturia, urinary frequency, and urinary hesitancy. MS:  Complains of joint pain and joint swelling; denies joint redness, loss of strength, low back pain, mid back pain, muscle aches, muscle , cramps, muscle weakness, stiffness, and thoracic pain. Derm:  Denies changes in color of skin, changes in nail beds, dryness, excessive perspiration, flushing, hair loss, insect bite(s), itching, lesion(s), poor wound healing, and rash. Neuro:  Denies brief paralysis, difficulty with concentration, disturbances in coordination, falling down, headaches, inability to speak, memory loss, numbness, poor balance, seizures, sensation of room spinning, tingling, tremors, visual disturbances, and weakness. Psych:  Denies alternate hallucination ( auditory/visual), anxiety, depression, easily angered, easily tearful, irritability, mental problems, panic attacks, sense of great danger, suicidal thoughts/plans, thoughts of violence, unusual visions or sounds, and thoughts /plans of harming others. Endo:  Denies cold intolerance, excessive hunger, excessive thirst, excessive urination, heat intolerance, polyuria, and weight change. Heme:  Denies abnormal bruising, bleeding, enlarge lymph nodes, fevers, pallor, and skin discoloration. Allergy:  Denies hives or rash, itching eyes, persistent infections, seasonal allergies, and sneezing.  Physical Exam  General:  Well-developed,well-nourished,in no acute distress; alert,appropriate and cooperative throughout examination Head:  Normocephalic and atraumatic without obvious abnormalities. No apparent alopecia or balding. Eyes:  pupils equal, pupils round, pupils reactive to light, and no  injection.   Ears:  External ear exam shows no significant lesions or deformities.  Otoscopic examination reveals clear canals, tympanic membranes  are intact bilaterally without bulging, retraction, inflammation or discharge. Hearing is grossly normal bilaterally. Nose:  External nasal examination shows no deformity or inflammation. Nasal mucosa are pink and moist without lesions or exudates. Mouth:  Oral mucosa and oropharynx without lesions or exudates.  Teeth in good repair. Neck:  No deformities, masses, or tenderness noted. Chest Wall:  No deformities, masses, or tenderness noted. Breasts:  No mass, nodules, thickening, tenderness, bulging, retraction, inflamation, nipple discharge or skin changes noted.   Lungs:  Normal respiratory effort, chest expands symmetrically. Lungs are clear to auscultation, no crackles or wheezes. Heart:  normal rate.   Abdomen:  Bowel sounds positive,abdomen soft and non-tender without masses, organomegaly or hernias noted. Rectal:  refused Genitalia:  refused Msk:  R knee pain swelling b/l  Pulses:  R and L carotid,radial,femoral,dorsalis pedis and posterior tibial pulses are full and equal bilaterally Extremities:  left pretibial edema and right pretibial edema.   Neurologic:  pt walks with walker Skin:  Intact without suspicious lesions or rashes Cervical Nodes:  No lymphadenopathy noted Psych:  Oriented X3, memory intact for recent and remote, normally interactive, good eye contact, not anxious appearing, and not depressed appearing.     Impression & Recommendations:  Problem # 1:  PREVENTIVE HEALTH CARE (ICD-V70.0)  Orders: Venipuncture (16109) TLB-Lipid Panel (80061-LIPID) TLB-BMP (Basic Metabolic Panel-BMET) (80048-METABOL) TLB-CBC Platelet - w/Differential (85025-CBCD) TLB-Hepatic/Liver Function Pnl (80076-HEPATIC) Specimen Handling (60454) UA Dipstick w/o Micro (manual) (81002) Medicare -1st Annual Wellness Visit [phone]) EKG w/ Interpretation (93000)  Problem # 2:  DEGENERATIVE JOINT DISEASE, KNEE (ICD-715.96)  Her updated medication list for this problem includes:     Hydrocodone-acetaminophen 5-500 Mg Tabs (Hydrocodone-acetaminophen) .Marland Kitchen... Take one tablet twice daily as needed.  Problem # 3:  HYPERTENSION (ICD-401.9)  Her updated medication list for this problem includes:    Lasix 40 Mg Tabs (Furosemide) .Marland Kitchen... Take two and one half tablets daily    Metoprolol Tartrate 50 Mg Tabs (Metoprolol tartrate) .Marland Kitchen... 1 by mouth two times a day  Orders: Venipuncture (91478) TLB-Lipid Panel (80061-LIPID) TLB-BMP (Basic Metabolic Panel-BMET) (80048-METABOL) TLB-CBC Platelet - w/Differential (85025-CBCD) TLB-Hepatic/Liver Function Pnl (80076-HEPATIC) Specimen Handling (29562) Prescription Created Electronically [phone])  BP today: 120/76 Prior BP: 124/82 (12/21/2009)  Labs Reviewed: K+: 3.8 (09/21/2009) Creat: : 0.8 (09/21/2009)   Chol: 137 (09/21/2009)   HDL: 46.00 (09/21/2009)   LDL: 67 (09/21/2009)   TG: 121.0 (09/21/2009)  Problem # 4:  HYPERLIPIDEMIA (ICD-272.4)  Her updated medication list for this problem includes:    Zocor 80 Mg Tabs (Simvastatin) .Marland Kitchen... Take 1 tablet by mouth once a day  Orders: Venipuncture (57846) TLB-Lipid Panel (80061-LIPID) TLB-BMP (Basic Metabolic Panel-BMET) (80048-METABOL) TLB-CBC Platelet - w/Differential (85025-CBCD) TLB-Hepatic/Liver Function Pnl (80076-HEPATIC) Specimen Handling (96295) Prescription Created Electronically [phone])  Labs Reviewed: SGOT: 21 (09/21/2009)   SGPT: 17 (09/21/2009)   HDL:46.00 (09/21/2009), 39.30 (01/20/2009)  LDL:67 (09/21/2009), 61 (01/20/2009)  Chol:137 (09/21/2009), 121 (01/20/2009)  Trig:121.0 (09/21/2009), 102.0 (01/20/2009)  Problem # 5:  DECUBITUS ULCER, BUTTOCK (ICD-707.05) per wound center  Complete Medication List: 1)  Zocor 80 Mg Tabs (Simvastatin) .... Take 1 tablet by mouth once a day 2)  Lasix 40 Mg Tabs (Furosemide) .... Take two and one half tablets daily 3)  Potassium Chloride Crys Cr 20 Meq Tbcr (Potassium chloride crys cr) .Marland Kitchen.. 1 by mouth two times a day 4)   Metoprolol  Tartrate 50 Mg Tabs (Metoprolol tartrate) .Marland Kitchen.. 1 by mouth two times a day 5)  Glucosamine-chondroitin 1500-1200 Mg/25ml Liqd (Glucosamine-chondroitin) .... Three times daily 6)  Hydrocodone-acetaminophen 5-500 Mg Tabs (Hydrocodone-acetaminophen) .... Take one tablet twice daily as needed. 7)  Multivitamins Caps (Multiple vitamin) .... One daily 8)  Dukes Magic Mouthwash  .... 5 ml by mouth qid swish and spit 9)  Valtrex 1 Gm Tabs (Valacyclovir hcl) .... By mouth once daily 10)  Zostavax 40347 Unt/0.105ml Solr (Zoster vaccine live) .Marland Kitchen.. 1 ml im x1  Other Orders: Admin 1st Vaccine (42595) Flu Vaccine 36yrs + (63875) Pneumococcal Vaccine (64332) Admin of Any Addtl Vaccine (95188)  Patient Instructions: 1)  Please schedule a follow-up appointment in 6 months .  Prescriptions: HYDROCODONE-ACETAMINOPHEN 5-500 MG  TABS (HYDROCODONE-ACETAMINOPHEN) Take one tablet twice daily as needed.  #60 x 0   Entered and Authorized by:   Loreen Freud DO   Signed by:   Loreen Freud DO on 09/23/2010   Method used:   Print then Give to Patient   RxID:   [id number] METOPROLOL TARTRATE 50 MG TABS (METOPROLOL TARTRATE) 1 by mouth two times a day  #180 Tablet x 3   Entered and Authorized by:   Loreen Freud DO   Signed by:   Loreen Freud DO on 09/23/2010   Method used:   Electronically to        [id number]* (retail)       [address]       [place], [place]  [address]       Ph: [phone] or [phone]       Fax: [phone]   RxID:   [id number] POTASSIUM CHLORIDE CRYS CR 20 MEQ TBCR (POTASSIUM CHLORIDE CRYS CR) 1 by mouth two times a day  #180 Tablet x 3   Entered and Authorized by:   Loreen Freud DO   Signed by:   Loreen Freud DO on 09/23/2010   Method used:   Electronically to        [id number]* (retail)       [address]       [place], [place]  [address]       Ph: [phone] or [phone]        Fax: [phone]   RxID:   [id number] LASIX 40 MG TABS (FUROSEMIDE) take two and one half tablets daily  #135 Tablet x 3   Entered and Authorized by:   Loreen Freud DO   Signed by:   Loreen Freud DO on 09/23/2010   Method used:   Electronically to        [id number]* (retail)       [address]       [place], [place]  [address]       Ph: [phone] or [phone]       Fax: [phone]   RxID:   [id number] ZOCOR 80 MG TABS (SIMVASTATIN) Take 1 tablet by mouth once a day  #90 Tablet x 3   Entered and Authorized by:   Loreen Freud DO   Signed by:   Loreen Freud DO on 09/23/2010   Method used:   Electronically to        [id number] (retail)  [address]       [place], [place]  [address]       Ph: [phone] or [phone]       Fax: [phone]   RxID:   [id number] ZOSTAVAX 19400 UNT/0.65ML SOLR (ZOSTER VACCINE LIVE) 1 ml IM x1  #1 x 0   Entered and Authorized by:   Loreen Freud DO   Signed by:   Loreen Freud DO on 09/23/2010   Method used:   Print then Give to Patient   RxID:   [id number]  Flu Vaccine Consent Questions     Do you have a history of severe allergic reactions to this vaccine? no    Any prior history of allergic reactions to egg and/or gelatin? no    Do you have a sensitivity to the preservative Thimersol? no    Do you have a past history of Guillan-Barre Syndrome? no    Do you currently have an acute febrile illness? no    Have you ever had a severe reaction to latex? no    Vaccine information given and explained to patient? yes    Are you currently pregnant? no    Lot Number:[id number]   Exp Date:05/07/2011   Site Given  Left Deltoid IM  Orders Added: 1)  Admin 1st Vaccine [90471] 2)  Flu Vaccine 30yrs + [90658] 3)  Venipuncture [36415] 4)  TLB-Lipid Panel [80061-LIPID] 5)  TLB-BMP (Basic Metabolic Panel-BMET) [80048-METABOL] 6)  TLB-CBC Platelet -  w/Differential [85025-CBCD] 7)  TLB-Hepatic/Liver Function Pnl [80076-HEPATIC] 8)  Pneumococcal Vaccine [90732] 9)  Admin of Any Addtl Vaccine [90472] 10)  Specimen Handling [99000] 11)  UA Dipstick w/o Micro (manual) [81002] 12)  Medicare -1st Annual Wellness Visit [G0438] 13)  EKG w/ Interpretation [93000] 14)  Est. Patient Level III [56433] 15)  Prescription Created Electronically [phone]   Immunizations Administered:  Pneumonia Vaccine:    Vaccine Type: Pneumovax (Medicare)    Site: right deltoid    Mfr: Merck    Dose: 0.5 ml    Route: IM    Given by: Almeta Monas CMA (AAMA)    Exp. Date: 03/08/2012    Lot #: [id number]    VIS given: 10/12/09 version given September 23, 2010.   Immunizations Administered:  Pneumonia Vaccine:    Vaccine Type: Pneumovax (Medicare)    Site: right deltoid    Mfr: Merck    Dose: 0.5 ml    Route: IM    Given by: Almeta Monas CMA (AAMA)    Exp. Date: 03/08/2012    Lot #: [id number]    VIS given: 10/12/09 version given September 23, 2010. Marland Kitchenlbflu  Flu Vaccine Result Date:  09/23/2010 Flu Vaccine Result:  given Flu Vaccine Next Due:  1 yr Colonoscopy Next Due:  Refused Hemoccult Next Due:  Refused Last PAP:  refused (09/21/2009 8:51:00 AM) PAP Next Due:  Refused Last Mammogram:  refused (09/21/2009 8:51:00 AM) Mammogram Next Due:  Refused Last Bone Density:  refused (09/21/2009 8:51:00 AM) Bone Density Next Due: Refused    Laboratory Results   Urine Tests   Date/Time Reported: September 23, 2010 11:22 AM   Routine Urinalysis   Color: yellow Appearance: Clear Glucose: negative   (Normal Range: Negative) Bilirubin: negative   (Normal Range: Negative) Ketone: negative   (Normal Range: Negative) Spec. Gravity: 1.010   (Normal Range: 1.003-1.035) Blood: negative   (Normal Range: Negative) pH: 6.5   (Normal Range: 5.0-8.0) Protein: negative   (Normal  Range: Negative) Urobilinogen: negative   (Normal Range: 0-1) Nitrite: negative    (Normal Range: Negative) Leukocyte Esterace: negative   (Normal Range: Negative)    Comments: Floydene Flock  September 23, 2010 11:22 AM

## 2010-12-09 NOTE — Consult Note (Signed)
[facility]   Imported By: Lanelle Bal 02/09/2010 08:32:36  _____________________________________________________________________  External Attachment:    Type:   Image     Comment:   External Document

## 2010-12-09 NOTE — Progress Notes (Signed)
Summary: cough  Phone Note Call from Patient   Caller: Patient Summary of Call: Pt states that she is feeling much better now all congestion and mucous is gone. Pt now c/o dry nagging cough that keeps her up at night. Pt use [facility] [address]............Marland KitchenFelecia Deloach CMA  November 25, 2010 8:34 AM   Follow-up for Phone Call        tessalon perles 200mg   1 by mouth three times a day as needed cough #30  ---- ov if it does not clear Follow-up by: Loreen Freud DO,  November 25, 2010 8:42 AM  Additional Follow-up for Phone Call Additional follow up Details #1::        Discuss with patient, Rx sent to pharmacy..........Marland KitchenFelecia Deloach CMA  November 25, 2010 10:18 AM     New/Updated Medications: TESSALON 200 MG CAPS (BENZONATATE) Take 1 by mouth three times a day as needed cough Prescriptions: TESSALON 200 MG CAPS (BENZONATATE) Take 1 by mouth three times a day as needed cough  #30 x 0   Entered by:   Jeremy Johann CMA   Authorized by:   Loreen Freud DO   Signed by:   Jeremy Johann CMA on 11/25/2010   Method used:   Faxed to ...       [facility]  [address] # [id number]* (retail)       [address]       [place], [place]  [address]       Ph: [phone] or [phone]       Fax: [phone]   RxID:   [id number]

## 2010-12-09 NOTE — Progress Notes (Signed)
Summary: Refill request  Phone Note Refill Request Message from:  Pharmacy on [facility] [address] Fax #: [phone]  Refills Requested: Medication #1:  LASIX 40 MG TABS take two and one half tablets daily   Dosage confirmed as above?Dosage Confirmed   Brand Name Necessary? No 2nd request  Initial call taken by: Harold Barban,  December 22, 2009 1:15 PM    Prescriptions: LASIX 40 MG TABS (FUROSEMIDE) take two and one half tablets daily  #135 Tablet x 0   Entered by:   Army Fossa CMA   Authorized by:   Loreen Freud DO   Signed by:   Army Fossa CMA on 12/22/2009   Method used:   Electronically to        [id number]* (retail)       [address]       [place], [place]  [address]       Ph: [phone] or [phone]       Fax: [phone]   RxID:   [id number]

## 2010-12-09 NOTE — Assessment & Plan Note (Signed)
Summary: cough,congestion//kp   Vital Signs:  Patient profile:   77 year old female Weight:      264.4 pounds O2 Sat:      97 % on Room air Temp:     98.1 degrees F oral Pulse rate:   65 / minute Pulse rhythm:   regular BP sitting:   120 / 72  (right arm) Cuff size:   large  Vitals Entered By: Almeta Monas CMA Duncan Dull) (November 11, 2010 10:17 AM)  O2 Flow:  Room air CC: x 5days c/o sinus pain/pressure, sore mouth and gums, headache and post nasal drainage with cough, URI symptoms   History of Present Illness:       This is a 77 year old woman who presents with URI symptoms.  The symptoms began 5 days ago.  + B/L sinus pressure--since Sunday.  The patient complains of nasal congestion, purulent nasal discharge, sore throat, productive cough, and sick contacts, but denies clear nasal discharge, dry cough, and earache.  The patient denies fever, low-grade fever (<100.5 degrees), fever of 100.5-103 degrees, fever of 103.1-104 degrees, fever to >104 degrees, stiff neck, dyspnea, wheezing, rash, vomiting, diarrhea, use of an antipyretic, and response to antipyretic.  The patient also reports headache.  The patient denies itchy watery eyes, itchy throat, sneezing, seasonal symptoms, response to antihistamine, muscle aches, and severe fatigue.  The patient denies the following risk factors for Strep sinusitis: unilateral facial pain, unilateral nasal discharge, poor response to decongestant, double sickening, tooth pain, Strep exposure, tender adenopathy, and absence of cough.    Preventive Screening-Counseling & Management  Alcohol-Tobacco     Alcohol drinks/day: 0     Smoking Status: never     Passive Smoke Exposure: no  Caffeine-Diet-Exercise     Caffeine use/day: 2     Does Patient Exercise: no  Current Medications (verified): 1)  Zocor 80 Mg Tabs (Simvastatin) .... Take 1 Tablet By Mouth Once A Day 2)  Lasix 40 Mg Tabs (Furosemide) .... Take Two and One Half Tablets Daily 3)   Potassium Chloride Crys Cr 20 Meq Tbcr (Potassium Chloride Crys Cr) .Marland Kitchen.. 1 By Mouth Two Times A Day 4)  Metoprolol Tartrate 50 Mg Tabs (Metoprolol Tartrate) .Marland Kitchen.. 1 By Mouth Two Times A Day 5)  Glucosamine-Chondroitin 1500-1200 Mg/59ml  Liqd (Glucosamine-Chondroitin) .... Three Times Daily 6)  Hydrocodone-Acetaminophen 5-500 Mg  Tabs (Hydrocodone-Acetaminophen) .... Take One Tablet Twice Daily As Needed. 7)  Multivitamins  Caps (Multiple Vitamin) .... One Daily 8)  Dukes Magic Mouthwash .... 5 Ml By Mouth Qid Swish and Spit 9)  Valtrex 1 Gm Tabs (Valacyclovir Hcl) .... By Mouth Once Daily 10)  Zostavax 16109 Unt/0.4ml Solr (Zoster Vaccine Live) .Marland Kitchen.. 1 Ml Im X1 11)  Ceftin 500 Mg Tabs (Cefuroxime Axetil) .Marland Kitchen.. 1 By Mouth Two Times A Day 12)  Nasonex 50 Mcg/act Susp (Mometasone Furoate) .... 2 Sprays Each Nostril Once Daily 13)  Astepro 0.15 % Soln (Azelastine Hcl) .... 2 Sprays Each Nostril Once Daily  Allergies (verified): 1)  ! Codeine  Past History:  Family History: Last updated: 02/12/2008 Family History of CAD Female 1st degree relative <72 M Family History of CAD Female 1st degree relative <64 F Family History of Stroke F 1st degree relative <60  Social History: Last updated: 02/12/2008 Married Never Smoked Alcohol use-no Drug use-no Regular exercise-no  Risk Factors: Alcohol Use: 0 (11/11/2010) Caffeine Use: 2 (11/11/2010) Exercise: no (11/11/2010)  Risk Factors: Smoking Status: never (11/11/2010) Passive Smoke Exposure: no (  11/11/2010)  Past medical, surgical, family and social histories (including risk factors) reviewed for relevance to current acute and chronic problems.  Past Medical History: Reviewed history from 02/12/2008 and no changes required. Hyperlipidemia Hypertension 2002-h/o pylori-treated by Dr. Virginia Rochester cataracts-unilateral lesion right UE x 3 yrs-[facility] 2001-h/o abnormal mammo-told just calcium deposit-repeat 2002- Right adrenal tumor-followed by  surgery obesity LE Edema RLQ pain-chronic-episodic hypersomneolea 1996-HTN h/o Left kidney stone 1999-Barrett's esophagitis-discovered form blood loss  Past Surgical History: Reviewed history from 02/12/2008 and no changes required. 1999-Left knee replacement 03/2002-EGD-lst one 2003 2003-MRI of renal to f/u adrenal Tonsillectomy Appendectomy 1976-Hysterectomy-fibroid, before that BTL 1966 1980-U/s right ovary cyst-laparoscopic showed ahesions and not cyst on ovary 2003-sleep evaluation teeth extractions 11/6-septic arthritis 11/2004-total right knee replacement  Family History: Reviewed history from 02/12/2008 and no changes required. Family History of CAD Female 1st degree relative <72 M Family History of CAD Female 1st degree relative <60 F Family History of Stroke F 1st degree relative <60  Social History: Reviewed history from 02/12/2008 and no changes required. Married Never Smoked Alcohol use-no Drug use-no Regular exercise-no  Review of Systems      See HPI  Physical Exam  General:  Well-developed,well-nourished,in no acute distress; alert,appropriate and cooperative throughout examination Ears:  External ear exam shows no significant lesions or deformities.  Otoscopic examination reveals clear canals, tympanic membranes are intact bilaterally without bulging, retraction, inflammation or discharge. Hearing is grossly normal bilaterally. Nose:  no external deformity, mucosal erythema, mucosal edema, L frontal sinus tenderness, L maxillary sinus tenderness, R frontal sinus tenderness, and R maxillary sinus tenderness.   Mouth:  Oral mucosa and oropharynx without lesions or exudates.  Teeth in good repair. Neck:  No deformities, masses, or tenderness noted. Lungs:  Normal respiratory effort, chest expands symmetrically. Lungs are clear to auscultation, no crackles or wheezes. Heart:  Normal rate and regular rhythm. S1 and S2 normal without gallop, murmur, click, rub  or other extra sounds. Extremities:  No clubbing, cyanosis, edema, or deformity noted with normal full range of motion of all joints.   Cervical Nodes:  No lymphadenopathy noted Psych:  Cognition and judgment appear intact. Alert and cooperative with normal attention span and concentration. No apparent delusions, illusions, hallucinations   Impression & Recommendations:  Problem # 1:  SINUSITIS - ACUTE-NOS (ICD-461.9)  Instructed on treatment. Call if symptoms persist or worsen.   Her updated medication list for this problem includes:    Ceftin 500 Mg Tabs (Cefuroxime axetil) .Marland Kitchen... 1 by mouth two times a day    Nasonex 50 Mcg/act Susp (Mometasone furoate) .Marland Kitchen... 2 sprays each nostril once daily    Astepro 0.15 % Soln (Azelastine hcl) .Marland Kitchen... 2 sprays each nostril once daily  Complete Medication List: 1)  Zocor 80 Mg Tabs (Simvastatin) .... Take 1 tablet by mouth once a day 2)  Lasix 40 Mg Tabs (Furosemide) .... Take two and one half tablets daily 3)  Potassium Chloride Crys Cr 20 Meq Tbcr (Potassium chloride crys cr) .Marland Kitchen.. 1 by mouth two times a day 4)  Metoprolol Tartrate 50 Mg Tabs (Metoprolol tartrate) .Marland Kitchen.. 1 by mouth two times a day 5)  Glucosamine-chondroitin 1500-1200 Mg/51ml Liqd (Glucosamine-chondroitin) .... Three times daily 6)  Hydrocodone-acetaminophen 5-500 Mg Tabs (Hydrocodone-acetaminophen) .... Take one tablet twice daily as needed. 7)  Multivitamins Caps (Multiple vitamin) .... One daily 8)  Dukes Magic Mouthwash  .... 5 ml by mouth qid swish and spit 9)  Valtrex 1 Gm Tabs (Valacyclovir hcl) .... By mouth  once daily 10)  Zostavax 81191 Unt/0.64ml Solr (Zoster vaccine live) .Marland Kitchen.. 1 ml im x1 11)  Ceftin 500 Mg Tabs (Cefuroxime axetil) .Marland Kitchen.. 1 by mouth two times a day 12)  Nasonex 50 Mcg/act Susp (Mometasone furoate) .... 2 sprays each nostril once daily 13)  Astepro 0.15 % Soln (Azelastine hcl) .... 2 sprays each nostril once daily  Patient Instructions: 1)  Try Biotin otc  for dry mouth---call as needed  2)  Acute sinusitis symptoms for less than 10 days are not helped by antibiotics. Use warm moist compresses, and over the counter decongestants( only as directed). Call if no improvement in 5-7 days, sooner if increasing pain, fever, or new symptoms.  Prescriptions: NASONEX 50 MCG/ACT SUSP (MOMETASONE FUROATE) 2 sprays each nostril once daily  #1 x 0   Entered and Authorized by:   Loreen Freud DO   Signed by:   Loreen Freud DO on 11/11/2010   Method used:   Historical   RxID:   [id number] CEFTIN 500 MG TABS (CEFUROXIME AXETIL) 1 by mouth two times a day  #20 x 0   Entered and Authorized by:   Loreen Freud DO   Signed by:   Loreen Freud DO on 11/11/2010   Method used:   Electronically to        [id number]* (retail)       [address]       [place], [place]  [address]       Ph: [phone] or [phone]       Fax: [phone]   RxID:   [id number]    Orders Added: 1)  Est. Patient Level III [41660]

## 2010-12-09 NOTE — Letter (Signed)
Summary: [facility]  [facility]   Imported By: Lanelle Bal 08/09/2010 13:47:56  _____________________________________________________________________  External Attachment:    Type:   Image     Comment:   External Document

## 2010-12-15 NOTE — Progress Notes (Addendum)
Summary: Refill Requests  Phone Note Refill Request Call back at [phone] Message from:  Pharmacy on December 06, 2010 4:40 PM  Refills Requested: Medication #1:  ZOCOR 80 MG TABS Take 1 tablet by mouth once a day   Dosage confirmed as above?Dosage Confirmed   Brand Name Necessary? No   Supply Requested: 3 months   Last Refilled: 09/23/2010   Notes: 4 refills  Medication #2:  METOPROLOL TARTRATE 50 MG TABS 1 by mouth two times a day   Dosage confirmed as above?Dosage Confirmed   Supply Requested: 3 months   Last Refilled: 09/23/2010   Notes: 4 refills  Medication #3:  LASIX 40 MG TABS take two and one half tablets daily   Dosage confirmed as above?Dosage Confirmed   Brand Name Necessary? No   Supply Requested: 3 months   Last Refilled: 09/23/2010  Medication #4:  POTASSIUM CHLORIDE CRYS CR 20 MEQ TBCR 1 by mouth two times a day [facility]  Next Appointment Scheduled: none Initial call taken by: Harold Barban,  December 06, 2010 4:41 PM    Prescriptions: POTASSIUM CHLORIDE CRYS CR 20 MEQ TBCR (POTASSIUM CHLORIDE CRYS CR) 1 by mouth two times a day  #180 Tablet x 2   Entered by:   Doristine Devoid CMA   Authorized by:   Loreen Freud DO   Signed by:   Doristine Devoid CMA on 12/07/2010   Method used:   Electronically to        [facility]* (retail)             ,          Ph: [phone]       Fax: [phone]   RxID:   [id number] METOPROLOL TARTRATE 50 MG TABS (METOPROLOL TARTRATE) 1 by mouth two times a day  #180 Tablet x 2   Entered by:   Doristine Devoid CMA   Authorized by:   Loreen Freud DO   Signed by:   Doristine Devoid CMA on 12/07/2010   Method used:   Electronically to        [facility]* (retail)             ,          Ph: [phone]       Fax: [phone]   RxID:   [id number] LASIX 40 MG TABS (FUROSEMIDE) take two and one half tablets daily  #135 Tablet x 2   Entered by:   Doristine Devoid CMA   Authorized by:   Loreen Freud DO   Signed by:    Doristine Devoid CMA on 12/07/2010   Method used:   Electronically to        [facility]* (retail)             ,          Ph: [phone]       Fax: [phone]   RxID:   [id number] ZOCOR 80 MG TABS (SIMVASTATIN) Take 1 tablet by mouth once a day  #90 Tablet x 2   Entered by:   Doristine Devoid CMA   Authorized by:   Loreen Freud DO   Signed by:   Doristine Devoid CMA on 12/07/2010   Method used:   Electronically to        [facility]* (retail)             ,          Ph: [phone]  Fax: [phone]   RxID:   [id number]   Appended Document: Refill Requests Call from [facility] to confirm Lasix, I advise the Rx is correct. He advised the quantity  was not, stated it was sent in for 135 and 90 days would actually be 225. I advise it was ok to change the quantity.

## 2011-02-03 ENCOUNTER — Other Ambulatory Visit: Payer: Self-pay | Admitting: *Deleted

## 2011-02-03 MED ORDER — HYDROCODONE-ACETAMINOPHEN 5-500 MG PO TABS: 1.0000 | ORAL_TABLET | Freq: Two times a day (BID) | ORAL | Status: AC | PRN

## 2011-02-03 NOTE — Telephone Encounter (Signed)
Rx faxed to [facility] [phone]     KP 

## 2011-02-03 NOTE — Telephone Encounter (Signed)
Last filled 09-23-10 #30,last ov 11-11-10.Felecia Zaxton Angerer CMA

## 2011-03-22 NOTE — Op Note (Signed)
Norma Ford, Norma Ford NO.:  [id number]   MEDICAL RECORD NO.:  [id number]          PATIENT TYPE:  AMB   LOCATION:  ENDO                         FACILITY:  [facility]   PHYSICIAN:  Georgiana Spinner, M.D.    DATE OF BIRTH:  21-Sep-1930   DATE OF PROCEDURE:  DATE OF DISCHARGE:                               OPERATIVE REPORT   PROCEDURE:  Colonoscopy.   INDICATIONS:  Colon cancer screening.   ANESTHESIA:  Fentanyl 100 mcg, Versed 10 mg.   PROCEDURE:  With the patient mildly sedated in the left lateral  decubitus position, the Pentax videoscopic colonoscope was inserted in  the rectum and passed under direct vision.  With pressure applied and  the patient rolled to her back and subsequently to the right side we  were able to reach the cecum, identified by ileocecal valve and  appendiceal orifice, both of which were photographed.  From this point  the colonoscope was slowly withdrawn, taking circumferential views of  colonic mucosa, stopping only in the rectum which appeared normal, and  the rectum showed hemorrhoids on the retroflexed view.  The endoscope  was straightened and withdrawn.  The patient's vital signs and pulse  oximeter remained stable.  The patient tolerated the procedure well  without apparent complications.   FINDINGS:  Internal hemorrhoids, otherwise unremarkable examination.   PLAN:  Repeat examination in 5-10 years.           ______________________________  Georgiana Spinner, M.D.     GMO/MEDQ  D:  04/03/2008  T:  04/03/2008  Job:  [id number]

## 2011-03-25 NOTE — Procedures (Signed)
[facility]  Patient:    CHALISE, PE Visit Number: [id number] MRN: [id number]          Service Type: OUT Location: ENDO Attending Physician:  Sabino Gasser Dictated by:   Sabino Gasser, M.D. Proc. Date: 03/07/02 Admit Date:  03/07/2002                             Procedure Report  PROCEDURE:  Upper endoscopy with biopsy.  INDICATIONS:  Gastroesophageal reflux disease, previous Barretts.  ANESTHESIA:  Demerol 60 mg, Versed 6 mg.  DESCRIPTION OF PROCEDURE:  With the patient mildly sedated in the left lateral decubitus position, the Olympus videoscopic endoscope was inserted in the mouth and passed under direct vision through the esophagus. The distal esophagus was approached and showed evidence of Barretts esophagus in small areas. There was also an area of the satellite area of Barretts that was seen but we did not photograph it, but multiple biopsies were taken. We then entered into the stomach. Fundus, body, antrum, duodenal bulb, and second portion of duodenum all appeared normal. From this point, the endoscope was slowly withdrawn taking circumferential views of the duodenal mucosa until the endoscope was then pulled back into the stomach, placed in retroflexion to view the stomach from below. The endoscope was then straightened and withdrawn taking circumferential views of the remaining gastric and esophageal mucosa. The patients vital signs and pulse oximeter remained stable. The patient tolerated the procedure well without apparent complications.  FINDINGS:  Barretts esophagus.  PLAN:  Await biopsy report. Patient will call Dr. Virginia Rochester for results and follow up with Dr. Virginia Rochester as an outpatient. Dictated by:   Sabino Gasser, M.D. Attending Physician:  Sabino Gasser DD:  03/07/02 TD:  03/07/02 Job: [id number]

## 2011-03-25 NOTE — Op Note (Signed)
Ford, Norma               ACCOUNT NO.:  [id number]   MEDICAL RECORD NO.:  [id number]            PATIENT TYPE:   LOCATION:                                 FACILITY:   PHYSICIAN:  Nadara Mustard, MD          DATE OF BIRTH:   DATE OF PROCEDURE:  09/21/2005  DATE OF DISCHARGE:                                 OPERATIVE REPORT   PREOP DIAGNOSIS:  Septic right total knee arthroplasty.   POSTOP DIAGNOSIS:  Septic right total knee arthroplasty.   PROCEDURE:  Irrigation, debridement, arthroscopically, right total knee.   SURGEON:  Nadara Mustard, MD   ANESTHESIA:  General LMA.   ESTIMATED BLOOD LOSS:  Minimal.   ANTIBIOTICS:  Zosyn preoperatively.   DRAINS:  One large Hemovac.   COMPLICATIONS:  None.   DISPOSITION:  To PACU in stable condition.   INDICATIONS FOR PROCEDURE:  The patient is a 77 year old woman, status post  right total knee arthroplasty, who developed a septic infection  approximately 2 years status post her total knee. Initial cultures were  positive for Proteus mirabilis. Most recent cultures from the last  irrigation and debridement are currently negative. The patient presents, at  this time, for repeat irrigation and debridement. The risks and benefits  were discussed including infection, neurovascular injury, persistent pain,  persistent infection, need for additional surgery. The patient states that  he understands and wishes to proceed at this time.   DESCRIPTION OF PROCEDURE:  The patient was brought to OR room #4 and  underwent a general LMA anesthetic. After an adequate level of anesthesia  obtained, the patient's right lower extremity was prepped using DuraPrep and  draped into a sterile field. The scope was inserted through the inferior  medial portal; a shaver was inserted through the inferior lateral portal;  and a drainage suction portal was established superolaterally.   The synovitis was debrided from the knee; 6 liters of normal saline  were  irrigated through the knee. There was no bleeding, no purulence within the  knee; there was clear serosanguineous fluid. A large Hemovac was placed  through the superolateral portal. The scope and the shaver were removed. The  proximal portal was closed using a 2-0 nylon and the drain was sutured in  place. The inferior portals were left open to drain. The wounds were covered  Adaptic,  orthopedic sponges, sterile Webril, and a Coban dressing. The patient was  extubated, taken to PACU in stable condition. Will repeat laboratory  studies; and will discontinue serial irrigation debridements once cultures  are all negative.      Nadara Mustard, MD  Electronically Signed     MVD/MEDQ  D:  09/21/2005  T:  09/21/2005  Job:  [id number]

## 2011-03-25 NOTE — Op Note (Signed)
NAMESHERAL, PFAHLER               ACCOUNT NO.:  [id number]   MEDICAL RECORD NO.:  [id number]            PATIENT TYPE:   LOCATION:                                 FACILITY:   PHYSICIAN:  Nadara Mustard, MD          DATE OF BIRTH:   DATE OF PROCEDURE:  09/27/2005  DATE OF DISCHARGE:                                 OPERATIVE REPORT   PREOP DIAGNOSIS:  Septic arthritis, right total knee arthroplasty status  post arthroscopic I&D x2.   POSTOP DIAGNOSIS:  Septic arthritis, right total knee arthroplasty status  post arthroscopic I&D x2.   PROCEDURE:  1.  Open irrigation and debridement with pulse lavage.  2.  Removal of tibial poly tray.  3.  Implantation of a new tibial poly tray size 5 x 10 mm thick; placement      of antibiotic beads of vancomycin, tobramycin, and palico cement.   SURGEON:  Nadara Mustard, MD   ANESTHESIA:  General.   ESTIMATED BLOOD LOSS:  Minimal.   ANTIBIOTICS:  Cipro preoperatively cultures obtained x2 from the  polyethylene tray.   TOURNIQUET TIME:  None.   DISPOSITION:  To PACU in stable condition. Plan to remove the antibiotic  beads and 1-2 weeks.   INDICATIONS FOR PROCEDURE:  The patient is a 77 year old woman who is status  post total knee arthroplasty who has developed septic arthritis. She has  undergone two irrigation debridements, arthroscopically, and had a  significant decrease in her white cell count as well as her CRP.  However,  the patient still had episodic elevated temperatures, and an elevated CRP  who presents, at this time, for open irrigation, debridement, and removal of  the poly tray. Risks and benefits were discussed including persistent  infection, need for removal of the hardware, need for additional surgery.  The patient states she understands and wishes to proceed at this time.   DESCRIPTION OF PROCEDURE:  The patient was brought to OR room #15 and  underwent a general anesthetic. After an adequate level of anesthesia  was  obtained; the patient's right lower extremity was prepped using DuraPrep,  and draped into a sterile field. An [place] and was used to cover all exposed  skin. The previous midline incision was used.  This was carried down and a  medial parapatellar retinacular incision was used. This was carried down to  the joint. Debridement was performed of all the synovial lining of the  joint. The polyethylene tray was removed and with the wound was irrigated  with pulse lavage normal saline. Cultures were obtained from the  polyethylene tray.   After debridement and pulse lavage a new polyethylene tray was inserted,  size 5 x 10 mm thick. A superolateral portal was established and antibiotic  beads made with palico cement and 1 gm of vancomycin and 1.2 gm a tobramycin  were made on a #1 Prolene suture; and this was placed through the  superolateral portal. The beads were run through the portal; and were felt  to glide smoothly through the portal. The  retinacular incision was then  closed  using a running number #1 PDS. The subcu was closed using #0 PDS. The skin  was closed using approximated staples. The wound was covered with Adaptic  and orthopedic sponges, sterile Webril, and a Coban dressing. The patient  was extubated, and taken to PACU in stable condition.      Nadara Mustard, MD  Electronically Signed     MVD/MEDQ  D:  09/27/2005  T:  09/28/2005  Job:  [id number]

## 2011-03-25 NOTE — Discharge Summary (Signed)
NAMEKENNIDI, YOSHIDA               ACCOUNT NO.:  [id number]   MEDICAL RECORD NO.:  [id number]          PATIENT TYPE:  INP   LOCATION:  5016                         FACILITY:  [facility]   PHYSICIAN:  Nadara Mustard, M.D.   DATE OF BIRTH:  17-Oct-1930   DATE OF ADMISSION:  07/22/2004  DATE OF DISCHARGE:  07/30/2004                                 DISCHARGE SUMMARY   DIAGNOSIS:  Osteoarthritis right knee.   PROCEDURE:  Right total knee arthroplasty.   COMPLICATIONS:  Postoperative ileus.   Discharged to rehab in stable condition.  Plan to follow up in two weeks  after discharge from the hospital.   INDICATIONS FOR PROCEDURE:  The patient is a 77 year old woman who was  unable to ambulate or perform activities of daily living due to right knee  arthritic pain.  The patient has failed conservative care.  She is status  post a left total knee arthroplasty and presents at this time for right  total knee arthroplasty.   HOSPITAL COURSE:  The patient's hospital course was essentially  unremarkable.  She underwent a right total knee arthroplasty on July 22, 2004, with [employer] components, #7 femur, #5 tibia, 10 mm poly tray,  with a #7 patella.  Surgeon:  Dr. Lajoyce Corners.  Assistant:  Dr. Magnus Ivan.  Tourniquet time:  61 minutes.  The patient was started on Kefzol for  infection prophylaxis and Coumadin for DVT prophylaxis.  The patient was  started on physical therapy on postoperative day one.  The patient was  extremely slow with therapy, and she developed a postoperative ileus due to  narcotics and immobilization.  GI was consulted.  An NG tube was placed.  The patient was maximum assist with physical therapy, and rehab [facility] was  also consulted.  The patient's hemoglobin and electrolytes remained stable.  The patient's postoperative ileus resolved.  The NG tube was removed.  The  patient was then discharged to rehab in stable condition on July 30, 2004, with followup in two weeks after  discharge from the hospital.       MVD/MEDQ  D:  09/01/2004  T:  09/01/2004  Job:  [id number]

## 2011-03-25 NOTE — Assessment & Plan Note (Signed)
[facility]                          [place] OFFICE NOTE   NAME:Ford, Norma FULGHUM                      MRN:          [id number]  DATE:08/08/2006                            DOB:          1930-03-01    HISTORY AND PHYSICAL:  Patient is a 77 year old white female who presented  to the office today for a complete physical exam.  The patient is no longer  getting Pap smears.  She is complaining of some pain in her knees still and  is seeing Dr. Lajoyce Ford on a regular basis.  She is struggling to get around her  house and Dr. Lajoyce Ford recommended she use her walker when she is out and about  and use her cane in the home.  The patient is not using her walker today  because she feels like if she is always using her walker she will never get  better.  She has no other complaints.   PAST MEDICAL HISTORY:  1. Barrett's esophagus.  2. Hypertension.  3. Kidney stones.  4. Right adrenal tumor followed by surgery.  5. Obesity.  6. Left lower extremity edema.  7. H. pylori.  8. Cataracts.  9. Hyperlipidemia.   PAST SURGICAL HISTORY:  1. __________ left knee replacement, 2003.  2. She had an EGD by Dr. Virginia Ford.  3. Appendectomy.  4. In 1976 she had a hysterectomy secondary to fibroids.  5. In November, 2006, she was admitted for septic arthritis in the knee      and had a right total knee replacement.   SOCIAL HISTORY:  She denies smoking or alcohol.  She is unable to exercise  secondary to pain in her knees.  Tetanus shot was last given in 2005.   MEDICATIONS:  1. Takes Lopressor 50 mg twice a day.  2. KCL  20 mg b.i.d.  3. Multivitamin once a day.  4. Calcium 500 mg 2-3 times a day.  5. Lasix 40 mg two and one-half a day.  6. Vytorin 10/40 one at night.  7. Aspirin 81 mg once a day.  8. Glucosamine chondroitin.  9. Zetia 10 mg a day.   ALLERGIES:  CODEINE.   FAMILY HISTORY:  Mother and father both have hypertension and strokes.  Father died at  age 94 of an MI.  Sister died at 69 years of age of a  hemorrhagic stroke.  Patient is married and has a daughter that lives in the  area.   PHYSICAL EXAM:  She is 5 feet 3/4 inches, weighs 301 pounds, temperature  98.3, pulse 64, respirations 20, blood pressure is 142/90, repeat was  140/88.  She is awake, alert and oriented, in no acute distress.  HEAD:  Head is normocephalic, atraumatic.  EYES:  Pupils are equal and reactive to light.  Tympanic membranes are  intact bilaterally.  Oropharynx is clear.  NECK:  Supple, no JVD, no bruits.  HEART:  Positive S1, S2 with decreased heart sounds.  LUNGS:  Clear to auscultation bilaterally.  No rales, rhonchi or wheezing.  ABDOMEN:  Obese but soft and nontender.  No masses palpated.  BREAST EXAM:  Was done.  No masses palpated.  No nipple discharge, no  dimpling, no axillary nodes.  GYN EXAM:  Refused.  She does have +1 pitting edema bilateral lower extremities.  SKIN:  No rashes or suspicious moles.  MUSCULOSKELETAL EXAM:  She does have good strength in all four extremities.   EKG:  Sinus bradycardia with an incomplete right bundle branch block, left  axis deviation, no change from the EKG done in June of 2005.   ASSESSMENT AND PLAN:  1. Complete physical exam.  Patient refused gynecology exam.  She will      schedule her mammogram.  We discussed self-breast examination, calcium      supplementation.  General health maintenance is up to date.  2. History of Barrett's esophagus.  The patient is to follow up with Dr.      Virginia Ford if symptoms return.  3. Hypertension.  Continue medications and check laboratories.  4. Increased cholesterol.  Continue medication and get fasting      laboratories today.  5. Status post right knee replacement.  Patient is still healing, getting      stronger every day.   FOLLOW UP:  Per ortho.       Norma Perla, DO     YRL/MedQ  DD:  08/08/2006  DT:  08/08/2006  Job #:  [id number]

## 2011-03-25 NOTE — Discharge Summary (Signed)
Norma Ford, Norma Ford               ACCOUNT NO.:  [id number]   MEDICAL RECORD NO.:  [id number]          PATIENT TYPE:  INP   LOCATION:  5032                         FACILITY:  [facility]   PHYSICIAN:  Nadara Mustard, MD     DATE OF BIRTH:  19-Jul-1930   DATE OF ADMISSION:  09/19/2005  DATE OF DISCHARGE:  10/03/2005                                 DISCHARGE SUMMARY   DIAGNOSIS:  Septic arthritis right total knee arthroplasty.   PROCEDURES:  1.  Arthroscopic irrigation debridement right knee on September 21, 2005.  2.  Open irrigation debridement, right knee with removal of tibial poly tray      and reimplantation of a poly tray as well as placement of antibiotic      beads.   Discharged to home in stable condition on October 03, 2005.   HISTORY OF PRESENT ILLNESS:  The patient is a 77 year old woman who presents  with pains and swelling of her right knee for over one week. She is status  post a right total knee arthroplasty on July 22, 2004. Aspiration  obtained in the office is positive for Proteus mirabilis, C-reactive protein  is  21.2, a sed rate of 91 and the patient presents at this time for  irrigation debridement arthroscopically antiseptic right knee.   HOSPITAL COURSE:  The patient's hospital course was essentially  unremarkable.  She underwent arthroscopic irrigation debridement of the  right knee on September 19, 2005.  Repeat cultures were obtained and patient  was placed on IV Zosyn. Infectious disease was consulted. The patient  underwent a second repeat irrigation debridement on September 21, 2005, with  arthroscopic irrigation debridement of the right knee. A large drain was  placed postoperatively. Infectious disease was consulted.  Initial plans  were for six weeks of IV Cipro and seven and a half months of oral Cipro and  nine months of oral rifampin. Postoperatively,  after the patient's second  arthroscopic debridement, she still had episodic fevers. Her  C-reactive  protein had decreased, however, due to her episodic fevers it was elected to  proceed with an open irrigation debridement and removal of the poly tray.  The patient's PICC line continued to alarm and this was replaced due to the  need for six weeks of IV antibiotics. The patient underwent a third surgical  procedure with open irrigation debridement of the right knee with removal  with tibial poly tray with reimplantation of a poly tray as well as  placement of antibiotic beads with vancomycin and tobramycin. Cultures were  obtained x2 and plan for remove the beads in the office in several weeks.  The patient's albumin was 2 and she was given a protein supplement. Her  hemoglobin was stable at 10.1. White blood cell count was 9.5. Infectious  disease wanted to continue her current treatment plans for six weeks of IV  Cipro 400 mg IV q.12h. and oral rifampin 300 mg p.o. q.12h. for six weeks,  at the end of the six weeks changed to Cipro 500 mg b.i.d. with rifampin 300  mg b.i.d. for seven  months. The patient's albumin slowly drifted lower and  she was continued on her protein supplements. Her white blood cell count  continued to decrease.  Her vital signs were stable. She was afebrile. She  was able to ambulate prior to discharge. Her C-reactive protein had dropped  to 5.3. The patient was discharged to  home in stable condition on October 03, 2005.  The C-reactive protein had  again dropped lower to 3. She is status post irrigation debridement x3 for  infection to her right knee and prescriptions given for Cipro, rifampin and  Vicodin. Plan to follow-up in office in two weeks to remove the antibiotic  beads.      Nadara Mustard, MD  Electronically Signed     MVD/MEDQ  D:  11/27/2005  T:  11/28/2005  Job:  [id number]

## 2011-03-25 NOTE — Consult Note (Signed)
NAME:  Norma Ford, Norma Ford                         ACCOUNT NO.:  [id number]   MEDICAL RECORD NO.:  [id number]                   PATIENT TYPE:  INP   LOCATION:  5016                                 FACILITY:  [facility]   PHYSICIAN:  Karlene Einstein, M.D.             DATE OF BIRTH:  10-Sep-1930   DATE OF CONSULTATION:  07/25/2004  DATE OF DISCHARGE:                                   CONSULTATION   CONSULTING PHYSICIAN:  Karlene Einstein, M.D.   REQUESTING PHYSICIAN:  Aldean Baker, M.D.   REASON FOR CONSULTATION:  Abdominal pain and diarrhea.   HISTORY OF PRESENT ILLNESS:  A 77 year old Caucasian female who is status  post right total knee arthroplasty on July 22, 2004.  Started  developing right upper quadrant abdominal pain and abdominal distention  since yesterday.  Her symptoms have progressively worsened that her abdomen  is extremely distended.  She denies radiation of the pain but it is  constant.  There are no alleviating factors.  It is exacerbated with  movement.  She feels nauseated and unable to eat.  She has not vomited.  She  is also having diarrhea for two days.  She denies melena or hematochezia.   REVIEW OF SYSTEMS:  GENERAL:  Denies fevers, chills, or night sweats.  SKIN:  Denies rash, itching, or wounds.  HEENT:  Eyes:  Denies changing vision, dry  eyes, or eye pain.  Mouth and throat:  Denies oral discomfort, gingival  pain, or bleeding.  RESPIRATORY:  Denies dyspnea at rest or with exertion.  No cough, wheezing, or hemoptysis.  CARDIAC:  Denies chest pain,  palpitations.  BREASTS:  Denies breast mass or nipple discharge.  GASTROINTESTINAL:  As in HPI.  GENITOURINARY:  She has a Foley catheter  placed.  MUSCULOSKELETAL:  She does have joint pain especially in her knees.  She had no muscle pains or back pains.  NEUROLOGIC:  Denies dizziness,  numbness, or headache.  PSYCHIATRIC:  Denies feelings of depression or  anxiety.  ENDOCRINE:  Denies polyuria, polydipsia,  heat or cold intolerance.   PAST MEDICAL HISTORY:  1.  Barrett's esophagus.  2.  History of small bowel obstruction status post adhesions.  3.  Appendectomy.  4.  Hysterectomy.  5.  Osteoarthritis.  6.  History of upper GI bleed secondary to NSAID use.   PAST SURGICAL HISTORY:  As above.   FAMILY HISTORY:  Noncontributory given her age.   SOCIAL HISTORY:  She has four children.  She lives at home.  There is no  history of tobacco or alcohol use.   ALLERGIES:  No known drug allergies.   CURRENT MEDICATIONS:  1.  Colace 100 mg b.i.d.  2.  Coumadin.  3.  Lexapro 10 mg daily.  4.  KCl 20 mEq b.i.d.  5.  Avapro 150 mg daily.  6.  Protonix 40 mg daily.  7.  Lopressor 50 mg b.i.d.  8.  Lasix 40 mg daily.  9.  Zocor 40 mg daily.  10. Zetia 10 mg daily.  11. Aspirin 81 mg daily.   PHYSICAL EXAMINATION:  VITAL SIGNS:  Temperature 97.2, pulse 63, respiratory  rate 20, blood pressure 110/47, pulse ox 95% on 2 L.  GENERAL:  Well-nourished, in mild distress.  Normal body habitus.  She is  obese.  SKIN:  Warm and dry.  No rash.  HEENT:  Eyes:  Pupils are equal, round, and reactive to light.  Conjunctivae  clear.  Sclerae white.  Mouth and throat:  Lips are without lesions.  Oral  mucosa moist without lesions.  Uvula elevates midline.  NECK:  Supple.  No carotid bruits, nodule, or masses.  LYMPHATIC:  No cervical lymphadenopathy.  RESPIRATORY:  Breathing is even and unlabored.  Breath sounds are clear to  auscultation bilaterally.  CHEST:  Nontender.  Normal in contour.  HEART:  Regular rate and rhythm.  No murmurs, rubs, or gallops.  ABDOMEN:  Diminished bowel sounds, extremely distended.  No bruits.  Diffusely tender to palpation.  MUSCULOSKELETAL:  No deformities.  She is status post right knee  replacement.  Testing difficult given pain.  NEUROLOGIC:  Cranial nerves grossly intact.  There is no tremor.  Speech is  clear.  PSYCHIATRIC:  Alert and oriented x3.  Affect and  behavior are appropriate.   LABORATORIES:  White count 13, hemoglobin 9.4, MCV 93.7, platelets 146, ANC  9.8.  UA:  WBCs 3-6, RBCs 0-2, nitrite negative, leukocytes moderate.  PT  19.2, INR 2.  Sodium 133, potassium 3.8, CO2 27, glucose 112, BUN 16,  creatinine 1.4, calcium 8.2.  CMP pending.  KUB:  Diffuse colonic distention  consistent with ileus.  No free air.  I have reviewed the KUB and discussed  the test results with the radiologist.   ASSESSMENT/PLAN:  Colonic ileus.  There is no free air.  She has gotten  narcotic pain medications after her knee replacement.  She has a history of  small bowel obstruction and adhesions.  There is a possibility of this  developing into a small bowel obstruction.  Will place NG tube to suction,  keep her n.p.o.  Also called Dr. Magnus Ivan for general surgery for an  evaluation.  Follow up on comprehensive metabolic panel.  She has  leukocytosis with left shift.   I have discussed her condition with family members and also obtained history  from the family members.      GD/MEDQ  D:  07/25/2004  T:  07/26/2004  Job:  [id number]

## 2011-03-25 NOTE — Discharge Summary (Signed)
NAMEKIMBA, LOTTES               ACCOUNT NO.:  [id number]   MEDICAL RECORD NO.:  [id number]          PATIENT TYPE:  ORB   LOCATION:  4529                         FACILITY:  [facility]   PHYSICIAN:  Mariam Dollar, P.A.  DATE OF BIRTH:  21-Nov-1929   DATE OF ADMISSION:  07/30/2004  DATE OF DISCHARGE:  08/11/2004                                 DISCHARGE SUMMARY   DISCHARGE DIAGNOSES:  1.  Right total knee replacement July 22, 2004, secondary to      degenerative joint disease.  2.  Pain management.  3.  Coumadin for deep vein thrombosis prophylaxis,  4.  Postoperative ileus resolved.  5.  Hypertension.  6.  Left rotator cuff repair.  7.  Depression.  8.  Anemia.  9.  Left total knee replacement in 1999.  10. Hyperlipidemia and Morganella urinary tract infection resolved.   HISTORY OF PRESENT ILLNESS:  A 77 year old white female with history of left  total knee replacement in 1999 with [facility], now admitted  September 15 with end stage changes of the right knee and no relief with  conservative care.  Underwent a right total knee arthroplasty July 22, 2004, per Dr. Lajoyce Corners.  Placed on Coumadin for deep vein thrombosis  prophylaxis.  Weightbearing as tolerated.  Postoperative abdominal  distention with liquid stool.  Gastroenterology Dr. Virginia Rochester suggested narcotic  induced colonic ileus.  Nasogastric tube placed as well as rectal tube.  Follow up abdominal films much improved.  No definite free air.  Nasogastric  tube and rectotube were removed.  She was placed on a clear liquid, advanced  as tolerated.  Admitted for a comprehensive rehabilitation program.   PAST MEDICAL HISTORY:  See discharge diagnoses.   ALLERGIES:  CODEINE.   SOCIAL HISTORY:  Lives with husband in [place], one level home, 2-3 steps  to entry.  Husband can assist on discharge.  Local family works.   MEDICATIONS PRIOR TO ADMISSION:  1.  Lexapro.  2.  Trental.  3.  Potassium chloride.  4.  Micardis.  5.  Metoprolol.  6.  Lasix.  7.  Vytorin.  8.  Aspirin 81 mg daily.  9.  Prevacid.   HOSPITAL COURSE:  Patient with progressive gains while on rehab services  with therapies initiated daily.  The following issues were followed during  the patient's rehabilitation course.  Pertaining to Ms. Speakman's right total  knee replacement, surgical site healing nicely, no signs of infection.  She  was now ambulating supervision throughout the rehabilitation unit.  She was  using Darvocet and Ultram on a limited basis for pain and monitored closely  for postoperative ileus secondary to narcotic induced.  Her bowel program  was now well regulated.  She had no nausea or vomiting.  She remained on  Reglan.  Blood pressures controlled with systolic pressures 150-148.  Postoperative anemia stable.  Latest hemoglobin 9, hematocrit 26.7.  She  remained on Coumadin for deep vein thrombosis prophylaxis.  There was no  bleeding episode.  She would complete Coumadin protocol August 21, 2004.  For complete functional ability, she was  ambulating with a rolling walker,  supervision transfers, moderate assist bed mobility, needing minimal assist  lower body activities of daily living.  She was discharged to home with Home  Health therapies.   DISCHARGE MEDICATIONS:  1.  Coumadin with latest dose of 5 mg daily to be completed August 21, 2004.  2.  Protonix 40 mg daily.  3.  Lexapro 10 mg daily.  4.  Potassium chloride 20 mEq b.i.d.  5.  Avapro 150 mg daily.  6.  Lopressor 50 mg b.i.d.  7.  Vytorin daily.  8.  Reglan will be discontinued August 10, 2004.  9.  Ultram 50 mg t.i.d. p.r.n.   DISCHARGE INSTRUCTIONS:  1.  Activity:  As tolerated.  2.  Diet:  Regular.   SPECIAL INSTRUCTIONS:  1.  Home Health nurse to complete Coumadin protocol.  2.  Follow up with Dr. Lajoyce Corners, Orthopedic Services, call for appointment.  3.  Home Health Physical and occupational therapy arranged.        DA/MEDQ  D:  08/10/2004  T:  08/10/2004  Job:  [id number]   cc:   Nadara Mustard, M.D.  Fax: [phone]   Georgiana Spinner, M.D.  [address]  [place]  [place] [address]  Fax: [phone]   Lonia Blood, M.D.  [address]  [place], [place] [address]  Fax: [phone]

## 2011-03-25 NOTE — Cardiovascular Report (Signed)
NAME:  Norma Ford, Norma Ford                         ACCOUNT NO.:  [id number]   MEDICAL RECORD NO.:  [id number]                   PATIENT TYPE:  OIB   LOCATION:  6501                                 FACILITY:  [facility]   PHYSICIAN:  Charlton Haws, M.D.                  DATE OF BIRTH:  Jun 16, 1930   DATE OF PROCEDURE:  05/11/2004  DATE OF DISCHARGE:                              CARDIAC CATHETERIZATION   PROCEDURE:  Cardiac catheterization.   CARDIOLOGIST:  Charlton Haws, M.D.   INDICATIONS:  This is a 77 year old patient preop for knee surgery.  He had  an abnormal Cardiolite study suggesting anterior apical wall ischemia.   The patient was morbidly obese prior to the procedure.  We tried to Doppler  her femoral arteries.  She had significant hip pain on the right side and we  were able to Doppler a deep left femoral artery and marked this with the  skin pin.   However, despite multiple attempts including a finder needle, we were unable  to cannulate the left femoral artery.   Due to the patient's morbid obesity, I felt it was safer to proceed with an  _____.   DESCRIPTION OF PROCEDURE:  The left brachial artery was cannulated without  difficulty.   The left main coronary artery was normal.   The left anterior descending artery was normal.   The first diagonal branch was normal.   There was a large intermediate branch which was normal.   The circumflex coronary artery was nondominant.  There were two small obtuse  marginal branches which were normal.   The circumflex proper was normal.   The right coronary artery was dominant.  There was one posterior lateral  branch and a single PDA.  There was no critical disease.   IMPRESSION:  The patient does not appear to have critical coronary artery  disease except for her morbid obesity.  She would be considered low risk  cardiac wise for orthopedic surgery.   She had normal ejection fraction by Cardiolite study.   I suspect that she  had a false positive study due to her morbid obesity.   PLAN:  The patient will follow up in our office in two days to check her  brachial and femoral artery sites.  She will then follow up with Dr. Lajoyce Corners to  proceed with her knee surgery.                                               Charlton Haws, M.D.    PN/MEDQ  D:  05/11/2004  T:  05/11/2004  Job:  [id number]   cc:   Nadara Mustard, M.D.  Fax: [phone]   E. Graceann Congress, M.D.   Gene Serpe, P.A. [facility]

## 2011-03-25 NOTE — Op Note (Signed)
NAME:  Norma Ford, Norma Ford NO.:  [id number]   MEDICAL RECORD NO.:  [id number]                    PATIENT TYPE:   LOCATION:                                       FACILITY:   PHYSICIAN:  Georgiana Spinner, M.D.                 DATE OF BIRTH:   DATE OF PROCEDURE:  03/15/2004  DATE OF DISCHARGE:                                 OPERATIVE REPORT   PROCEDURE:  Upper endoscopy with dilation.   INDICATIONS:  Dysphagia.   ANESTHESIA:  1. Demerol 40.  2. Versed 4 mg.   PROCEDURE:  With patient mildly sedated in left lateral decubitus position,  the Olympus videoscopic endoscope was inserted in the mouth and passed under  direct vision through the esophagus, which appeared normal, into the  stomach, fundus, body, antrum, duodenal bulb, second portion of duodenum,  all appeared normal.  From this point, the endoscope was slowly withdrawn,  taking circumferential views of duodenal mucosa until the endoscope had been  pulled back and the stomach placed on retroflexion to view the stomach from  below.  The endoscope was straightened and withdrawn, taking circumferential  views of the remaining gastric and esophageal mucosa.  The endoscope was  then reinserted back to the antrum.  A guide wire was passed, the endoscope  was withdrawn.  The 16 Savary dilator was passed rather easily over the  guide wire and the guide wire was then removed.  The patient's vital signs,  pulse oximeter remained stable.  Patient tolerated the procedure well  without apparent complications.   FINDINGS:  1. Unremarkable examination.  2. Dilation of distal esophagus.   PLAN:  Await clinical response.                                               Georgiana Spinner, M.D.    GMO/MEDQ  D:  03/15/2004  T:  03/15/2004  Job:  [id number]

## 2011-03-25 NOTE — Consult Note (Signed)
NAME:  Norma Ford, Norma Ford                         ACCOUNT NO.:  [id number]   MEDICAL RECORD NO.:  [id number]                   PATIENT TYPE:  INP   LOCATION:  5016                                 FACILITY:  [facility]   PHYSICIAN:  Abigail Miyamoto, M.D.              DATE OF BIRTH:  07/12/1930   DATE OF CONSULTATION:  07/25/2004  DATE OF DISCHARGE:                                   CONSULTATION   CONSULTING PHYSICIAN:  Dr. Kerry Dory   CHIEF COMPLAINT:  Abdominal distention.   HISTORY:  This is a 77 year old female who underwent a right knee  replacement on July 22, 2004.  She presented today with worsening  abdominal distention.  I have been consulted to evaluate this by the  hospitalists.  The patient reports she did have a small BM this morning  which was liquidy, but has not been passing any flatus.  She is currently  been nonambulatory, is on a large amount of narcotics.  She reports she has  mild abdominal pain, is worried about distention.  She has had no nausea or  vomiting.   PAST MEDICAL HISTORY:  1.  Perforated appendix.  2.  Apparently a small bowel obstruction in the past.   PHYSICAL EXAMINATION:  GENERAL:  Elderly, obese female lying in bed in no  acute distress.  VITAL SIGNS:  She is afebrile.  Vital signs are stable.  LUNGS:  Clear to auscultation bilaterally.  CARDIOVASCULAR:  Regular rate and rhythm.  ABDOMEN:  Diffusely distended.  It is minimally tender.  There is no  guarding.  There are multiple well healed incisions.  There are no hernias.   LABORATORY DATA:  Patient has a white blood count of 13.0, hemoglobin 9.4.  PT 19.2 with INR 2.0.  X-ray data:  Patient has a diffusely distended colon  with a large amount of air and a large amount of stool in her right colon.  There is no free air.  There is no small bowel distention.   IMPRESSION AND PLAN:  This is a patient with colonic ileus secondary to  narcotics and being nonambulatory.  At this point I have  recommended that  gastroenterology be consulted as the patient may need endoscopy to help  relieve this.  Currently, there is no signs of bowel compromise or  perforation and no plans for surgical intervention at this point.  I will  order Fleet's enemas to be done until gastroenterology can evaluate Norma Ford.      DB/MEDQ  D:  07/25/2004  T:  07/26/2004  Job:  [id number]

## 2011-03-25 NOTE — Op Note (Signed)
NAME:  BEAU, VANDUZER                         ACCOUNT NO.:  [id number]   MEDICAL RECORD NO.:  [id number]                   PATIENT TYPE:  INP   LOCATION:  5016                                 FACILITY:  [facility]   PHYSICIAN:  Nadara Mustard, M.D.                DATE OF BIRTH:  [birth year]   DATE OF PROCEDURE:  07/22/2004  DATE OF DISCHARGE:                                 OPERATIVE REPORT   PREOPERATIVE DIAGNOSIS:  Osteoarthritis, right knee.   POSTOPERATIVE DIAGNOSIS:  Osteoarthritis, right knee.   OPERATION/PROCEDURE:  Right total knee arthroplasty with [employer] Scorpion  components, #7 posterior stabilized femur, 10 mm poly tray, #5 tibia, and a  #7 patella.   SURGEON:  Nadara Mustard, M.D.   ASSISTANT:  Vanita Panda. Magnus Ivan, M.D.   ANESTHESIA:  General plus popliteal block.   ESTIMATED BLOOD LOSS:  Minimal.   ANTIBIOTICS:  1 g Kefzol.   TOURNIQUET TIME:  61 minutes with the targeted thigh at 350 mmHg.   DISPOSITION:  To PACU in stable condition.   INDICATIONS FOR PROCEDURE:  The patient is a 77 year old woman who is status  post a left total knee arthroplasty in April 1999.  The patient has had  progressive pain in the right knee.  She is unable to performed activities  of daily living and presents at this time for total knee arthroplasty.  The  risks and benefits were discussed including infection, neurovascular injury,  persistent pain, need for additional surgery, DVT, pulmonary embolus.  The  patient states he understands and wishes to proceed at this time.   DESCRIPTION OF PROCEDURE:  The patient was brought to OR room #1 after  undergoing a popliteal block.  The patient underwent a general anesthetic.  At adequate level of anesthesia was obtained, the patient's right lower  extremity was prepped using DuraPrep and draped into the sterile field.  Collier Flowers was used to cover all exposed skin.  The knee was flexed and the  tourniquet inflated at 350 mmHg.  A midline  incision was made and a medial  parapatellar incision was then carried down.   Attention was first focused on the femur.  The starting canal hole was made  and the canal finder was used with a 7-degree valgus and 10 mm of the distal  femur were taken with a 10 mm block and an additional 2 mm was taken.  Attention was then focused on the tibia using the external alignment guide.  This was set for 4 mm off the medial tibial plateau.  An additional 2 mm was  taken for the tibial plateau cut with a 5-degree posterior slope.   Attention was then focused on the femur.  A chamfer block guide was used.  This was set for a 7, pinned for a 7 and the chamfer cuts were made.  The  posterior stabilized cutting block was placed. This was pinned  and the  chamfer cuts were made for the posterior stabilized femoral component.  The  tibial and femoral trials were then placed.  The knee was placed through  range of motion and the tibial had good coverage with a #5 tibial tray and  had a good stability with varus and valgus stress with the 10 mm poly.  The  instruments were removed.  The trials were removed.  The patella was  resurfaced with 10 mm taken off the patella.  This was sized for a 7.  Punch  holes were made for a 7.  The knee was irrigated with pulse lavage.  The  posterior compartment was cleansed of meniscus and other soft tissue debris.  The attention was then focused on the tibia.  After the tibia rotation was  marked, the keel punches were made for the tibia.  The components were then  cemented with the femoral and tibial components being cemented in place and  the patella component was cemented.  The knee was then irrigated with pulse  lavage.  The cement was removed.  After the components had hardened, the  tibial tray was then placed with a 10 mm posterior stabilized tibial tray.  The knee was placed through a range of motion.  There was a slight  subluxation in the patella and a lateral  release was performed from the  patella.  The retinacular incision immediately was then closed using #1  Vicryl.  Subcutaneous tissue closed with #1 Vicryl and 2-0 Vicryl and the  skin was closed using Proximate staples.  The patella tracked midline with  range of motion.  The wound was covered with Adaptic, orthopedic sponges,  ABD dressing, Webril and the Coban.  The tourniquet was deflated after 61  minutes and hemostasis was obtained after deflation of the tourniquet.  The  patient was extubated, taken to the PACU in stable condition.                                               Nadara Mustard, M.D.    MVD/MEDQ  D:  07/22/2004  T:  07/22/2004  Job:  [id number]

## 2011-03-25 NOTE — Op Note (Signed)
   NAME:  Norma Ford, Norma Ford                         ACCOUNT NO.:  [id number]   MEDICAL RECORD NO.:  [id number]                   PATIENT TYPE:  AMB   LOCATION:  ENDO                                 FACILITY:  [facility]   PHYSICIAN:  Georgiana Spinner, M.D.                 DATE OF BIRTH:  Jun 02, 1930   DATE OF PROCEDURE:  02/11/2003  DATE OF DISCHARGE:                                 OPERATIVE REPORT   PROCEDURE PERFORMED:  Upper endoscopy with biopsy.   ENDOSCOPIST:  Georgiana Spinner, M.D.   INDICATIONS FOR PROCEDURE:  Gastroesophageal reflux disease, Barrett's  esophagus.   ANESTHESIA:  Demerol 50 mg, Versed 5 mg.   DESCRIPTION OF PROCEDURE:  With the patient mildly sedated in the left  lateral decubitus position, the Olympus video endoscope was inserted in the  mouth and passed under direct vision through the esophagus and there was a  question of Barrett's esophagus photographed and biopsied.  We entered into  the stomach, the fundus, body, antrum, duodenal bulb, and second portion of  the duodenum.  From this point, the endoscope was slowly withdrawn taking  circumferential views of the entire duodenal mucosa until the endoscope was  pulled back into the stomach and placed on retroflexion to view the stomach  from below.  The endoscope was then straightened and withdrawn taking  circumferential views of the remaining gastric and esophageal mucosa.  The  patient's vital signs and pulse oximeter remained stable.  The patient  tolerated the procedure well without apparent complications.   FINDINGS:  Question of Barrett's esophagus, biopsied.  Await biopsy report.  The patient will call me for results and follow up with me as an outpatient.   PLAN:                                               Georgiana Spinner, M.D.    GMO/MEDQ  D:  02/11/2003  T:  02/11/2003  Job:  [id number]

## 2011-03-25 NOTE — Op Note (Signed)
[facility]  Patient:    Norma Ford, Norma Ford                      MRN: [id number] Proc. Date: 01/13/00 Adm. Date:  [id number] Disc. Date: [id number] Attending:  Aldean Baker V                           Operative Report  PREOPERATIVE DIAGNOSES: 1. Massive left rotator cuff tear. 2. Impingement syndrome. 3. 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  POSTOPERATIVE DIAGNOSES: 1. Massive left rotator cuff tear. 2. Impingement syndrome. 3. 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  PROCEDURE: 1. Debridement of massive rotator cuff tear. 2. Debridement of completely degenerative torn biceps tendon. 3. Subacromial decompression.  SURGEON:  Nadara Mustard, M.D.  ANESTHESIA:  General endotracheal.  ESTIMATED BLOOD LOSS:  Minimal.  ANTIBIOTICS:  None.  DRAINS:  None.  COMPLICATIONS:  None.  DISPOSITION:  To PACU in stable condition.  INDICATIONS FOR PROCEDURE:  The patient is a 77 year old woman with a longstanding history of pain and decreased range of motion of the left shoulder.  She has failed conservative care with injection of nonsteroidals and activity modification, and presents at this time for arthroscopic intervention.  Risks and benefits were discussed including infection, neurovascular injury, persistent pain, persistent loss of motion.  The patient states that he understands and wished to proceed at this time.  DESCRIPTION OF PROCEDURE:  The patient was brought to outpatient room #6 and underwent a general endotracheal anesthetic.  After an adequate level of anesthesia was obtained, the patients left upper extremity was prepped with DuraPrep and draped into the sterile field with the patient in a beach chair position.  After the sterile draping, the scope was inserted into the posterior portal and  using the ________ technique, an anterior portal was established with the vapor  and the shaver.  Visualization  showed a significant amount of synovitis, and complete rupture of the biceps tendon, and a massive rotator cuff tear with the  rotator cuff margin being at the glenoid.  A shaver was inserted through the anterior portal for debridement of the rotator cuff tear, and a vapor Mitek was  used for hemostasis.  This was not used on cartilage.  Full thickness cartilage  defects with essentially eburnated bone-on-bone of the glenoid and humeral head.  Attention was then focused on the subacromial space after debridement within the joint.  The shaver and vapor Mitek was inserted from the lateral portal, and a subacromial decompression was performed.  The vapor was used for hemostasis on he soft tissue.  Visualization then again confirmed complete massive tear of the rotator cuff with essentially no rotator cuff tissue available for repair.  The instruments were removed and the portals were injected with 20 cc of 0.5% Marcaine plain with 4 mg of morphine.  The portals were closed using 3-0 nylon.  The wounds were covered with Adaptic, orthopedic sponges, sterile ABD, and Hypafix tape.  The patient was extubated and taken to PACU in stable condition.  Followup in the office in one week. DD:  01/13/00 TD:  01/15/00 Job: [id number]

## 2011-03-25 NOTE — Op Note (Signed)
[facility]  Patient:    Norma Ford, Norma Ford                      MRN: [id number] Proc. Date: 04/11/00 Adm. Date:  [id number] Disc. Date: [id number] Attending:  Sabino Gasser                           Operative Report  PROCEDURE PERFORMED:  Upper endoscopy with biopsy.  ENDOSCOPIST:  Sabino Gasser, M.D.  INDICATIONS FOR PROCEDURE:  Abdominal pain, Barretts esophagus.  ANESTHESIA:  Demerol 50 mg, Versed 7 mg was given intravenously in divided dose.  DESCRIPTION OF PROCEDURE:  With the patient mildly sedated in the left lateral decubitus position, the Olympus video endoscope was inserted in the mouth and passed under direct vision through the esophagus.  The distal esophagus was approached and showed areas of Barretts esophagus, photographed and biopsied. We entered into the stomach and passed the endoscope to the antrum, duodenal bulb and second portion of the duodenum all of which appeared normal and were photographed.  From this point, the endoscope was slowly withdrawn taking circumferential views of the entire duodenal mucosa until the endoscope had been pulled back into the stomach and placed on retroflexion to view the stomach from below and ulcers were seen on the fundus on folds and they were photographed and biopsied.  The endoscope was then straightened and withdrawn taking circumferential views of the remaining gastric and esophageal mucosa which otherwise appeared normal.  Patients vital signs and pulse oximeter remained stable.  The patient tolerated the procedure well without apparent complications.  FINDINGS:  Linear ulcerations and erosions of thickened folds, biopsied. Barretts esophagus biopsied.  Reflux seen actively.  IMPRESSION:  Barretts esophagus with ulceration of stomach.  Await biopsy report.  Patient will call me for results and follow up with me as an outpatient. DD:  04/11/00 TD:  04/15/00 Job: [id number]

## 2011-03-25 NOTE — Op Note (Signed)
NAMERIYANSHI, WAHAB               ACCOUNT NO.:  [id number]   MEDICAL RECORD NO.:  [id number]          PATIENT TYPE:  INP   LOCATION:  5032                         FACILITY:  [facility]   PHYSICIAN:  Nadara Mustard, MD     DATE OF BIRTH:  [birth year]   DATE OF PROCEDURE:  09/19/2005  DATE OF DISCHARGE:                                 OPERATIVE REPORT   PREOPERATIVE DIAGNOSIS:  Septic right total knee arthroplasty with Proteus  mirabilis.   POSTOPERATIVE DIAGNOSIS:  Septic right total knee arthroplasty with Proteus  mirabilis.   PROCEDURE:  Arthroscopic irrigation and debridement of right knee.   SURGEON:  Nadara Mustard, M.D.   ANESTHESIA:  General anesthesia.   ESTIMATED BLOOD LOSS:  Minimal.   ANTIBIOTICS:  Zosyn intraoperatively.   DRAINS:  One medium Hemovac.   DISPOSITION:  To PACU in stable condition.   INDICATIONS FOR PROCEDURE:  Patient is a 77 year old woman with a body mass  index greater than 40 who underwent a total knee arthroplasty approximately  two years ago.  Patient showed up Friday in my office with pain and swelling  in the right knee.  There was no redness, no warmth, no elevated  temperature.  Her knee was aspirated.  She did have a hemarthrosis, no  evidence of purulence.  Patient was started on Doxycycline and presents  today two days after initial evaluation and approximately a week after  started developing knee pain.  Examination at this time, patient had  developed some redness around the knee.  Her sed rate and CRP were elevated.  Her white blood cell count was normal.  Patient had redness around the knee  at this time.  Cultures were positive for proteus mirabilis sensitive to all  antibiotics tested.  Patient is brought at this time urgently for irrigation  and debridement of her right total knee.  The risks and benefits were  discussed including persistent infection, need for additional surgery, need  for open debridement or excision of the  implants.  Patient states he  understands and wishes to proceed at this time.   DESCRIPTION OF PROCEDURE:  Patient was brought to OR room #1 and underwent a  general anesthetic. After adequate level of anesthesia was obtained,  patient's right lower extremity was prepped using DuraPrep and draped into a  sterile field.  The scope was inserted through the inferior medial portal  and a working portal was established inferolaterally.  The shaver was  inserted and the synovitis was debrided.  There was no purulence.  There was  significant amount of chronic synovitis.  There was a significant amount of  chronic synovitis.  This was debrided.  Cultures were obtained prior to the  antibiotics being given.  The joint was then irrigated and debrided with a  total of 9 liters with pulse lavage.  A medium Hemovac was placed to the  inferior medial portal.  This was sutured in place  with 4-0 nylon.  The wounds were covered with Adaptic orthopedic sponges,  sterile Webril and a Coban dressing.  Patient was then extubated,  taken to  PACU in stable condition.  Plan for IV Zosyn and possible repeat irrigation  and debridement pending the results from her serial CRP studies.      Nadara Mustard, MD  Electronically Signed     MVD/MEDQ  D:  09/19/2005  T:  09/20/2005  Job:  [id number]

## 2011-07-19 ENCOUNTER — Other Ambulatory Visit: Payer: Self-pay

## 2011-07-19 NOTE — Telephone Encounter (Signed)
Last seen 11/11/10 and filled 02/03/11 please advise    KP

## 2011-07-20 MED ORDER — HYDROCODONE-ACETAMINOPHEN 5-500 MG PO TABS: 1.0000 | ORAL_TABLET | Freq: Two times a day (BID) | ORAL | Status: DC

## 2011-07-20 NOTE — Telephone Encounter (Signed)
Faxed to [employer]    KP

## 2011-10-05 ENCOUNTER — Ambulatory Visit (HOSPITAL_BASED_OUTPATIENT_CLINIC_OR_DEPARTMENT_OTHER)
Admission: RE | Admit: 2011-10-05 | Discharge: 2011-10-05 | Disposition: A | Discharge status: Home / Self Care | Payer: Medicare Other | Source: Ambulatory Visit | Attending: Family | Admitting: Family

## 2011-10-05 ENCOUNTER — Ambulatory Visit (INDEPENDENT_AMBULATORY_CARE_PROVIDER_SITE_OTHER): Payer: Medicare Other | Admitting: Family

## 2011-10-05 ENCOUNTER — Telehealth: Payer: Self-pay | Admitting: Family

## 2011-10-05 ENCOUNTER — Encounter: Payer: Self-pay | Admitting: Family

## 2011-10-05 VITALS — BP 140/80 | HR 84 | Temp 98.3°F | Resp 16 | Wt 275.1 lb

## 2011-10-05 DIAGNOSIS — M47814 Spondylosis without myelopathy or radiculopathy, thoracic region: Secondary | ICD-10-CM | POA: Insufficient documentation

## 2011-10-05 DIAGNOSIS — J40 Bronchitis, not specified as acute or chronic: Secondary | ICD-10-CM

## 2011-10-05 DIAGNOSIS — M949 Disorder of cartilage, unspecified: Secondary | ICD-10-CM | POA: Insufficient documentation

## 2011-10-05 DIAGNOSIS — R05 Cough: Secondary | ICD-10-CM

## 2011-10-05 DIAGNOSIS — R079 Chest pain, unspecified: Secondary | ICD-10-CM

## 2011-10-05 DIAGNOSIS — M948X9 Other specified disorders of cartilage, unspecified sites: Secondary | ICD-10-CM

## 2011-10-05 DIAGNOSIS — M899 Disorder of bone, unspecified: Secondary | ICD-10-CM | POA: Insufficient documentation

## 2011-10-05 DIAGNOSIS — R059 Cough, unspecified: Secondary | ICD-10-CM | POA: Insufficient documentation

## 2011-10-05 MED ORDER — ALBUTEROL 90 MCG/ACT IN AERS: 2.0000 | INHALATION_SPRAY | Freq: Four times a day (QID) | RESPIRATORY_TRACT | Status: DC | PRN

## 2011-10-05 MED ORDER — AZITHROMYCIN 250 MG PO TABS: ORAL_TABLET | ORAL | Status: AC

## 2011-10-05 MED ORDER — ALBUTEROL SULFATE (2.5 MG/3ML) 0.083% IN NEBU
2.5000 mg | INHALATION_SOLUTION | Freq: Once | RESPIRATORY_TRACT | Status: AC
  Administered 2011-10-05: 2.5 mg via RESPIRATORY_TRACT

## 2011-10-05 NOTE — Telephone Encounter (Signed)
Reviewed chest xray results with patient.  Will plan to treat with zithromax and albuterol MDI.  Pt aware.  Advised pt to call our office if shortness of breath/cough or fever.  I would like for her to be seen back in the office on Friday with Dr. Laury Axon if she is available.  If not I am happy to see her back.  Trish could you pls arrange this apt and contact pt? Thanks.

## 2011-10-05 NOTE — Patient Instructions (Signed)
Please complete your chest x-ray on the first floor. We will call you with the results.

## 2011-10-05 NOTE — Progress Notes (Signed)
  Subjective:    Patient ID: Norma Ford, female    DOB: January 16, 1930, 77 y.o.   MRN: [id number]  HPI  [name] is a 77 yr old female who presents today with chief complaint of cough.  Symptoms started 4 weeks ago. Cough is non-productive.  Husband reports + associated wheezing.  Patient reports+ associated shortness of breath. She reports decreased appetite and chills at night.  No known fever.  Denies ear/eye complaints.     Review of Systems See HPI  Past Medical History  Diagnosis Date  . Hyperlipidemia   . HTN (hypertension)   . Cataracts, bilateral   . History of abnormal mammogram     2001-h/o abnormal mammo-told just calcium deposit-repeat  . 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 Kidney stone   . Barrett's esophagus 1999  . THROMBOCYTOPENIA 12/21/2009  . MYCOSIS FUNGOIDES LYMPH NODES MULTIPLE SITES 09/21/2009  . DECUBITUS ULCER, BUTTOCK 12/21/2009    History   Social History  . Marital Status: Married    Spouse Name: N/A    Number of Children: N/A  . Years of Education: N/A   Occupational History  . Not on file.   Social History Main Topics  . Smoking status: Never Smoker   . Smokeless tobacco: Never Used  . Alcohol Use: Not on file  . Drug Use: Not on file  . Sexually Active: Not on file   Other Topics Concern  . Not on file   Social History Narrative  . No narrative on file    Past Surgical History  Procedure Date  . Total knee arthroplasty     left 2009, right 2003  . Esophagogastroduodenoscopy 03/2002  . Tubal ligation   . Abdominal hysterectomy   . Tonsillectomy   . Appendectomy     No family history on file.  Allergies  Allergen Reactions  . Codeine     REACTION: ITCHING    No current outpatient prescriptions on file prior to visit.    BP 140/80  Pulse 84  Temp(Src) 98.3 F (36.8 C) (Oral)  Resp 16  Wt 275 lb 1.9 oz (124.794 kg)  SpO2 96%       Objective:   Physical Exam  Constitutional: She appears  well-developed and well-nourished.       Poorly groomed.  Clothing is stained with food.  HENT:  Head: Normocephalic and atraumatic.  Right Ear: Tympanic membrane and ear canal normal.  Left Ear: Tympanic membrane and ear canal normal.  Eyes: No scleral icterus.  Cardiovascular: Normal rate and regular rhythm.   No murmur heard. Pulmonary/Chest: Effort normal and breath sounds normal. No respiratory distress. She has no wheezes. She has no rales.       Diminished breath sounds at the bases.  Psychiatric: Her speech is normal and behavior is normal. Thought content normal. Her affect is labile.          Assessment & Plan:

## 2011-10-06 ENCOUNTER — Telehealth: Payer: Self-pay | Admitting: Family

## 2011-10-06 NOTE — Telephone Encounter (Signed)
Opened in error

## 2011-10-06 NOTE — Telephone Encounter (Signed)
Pt has already scheduled f/u with Dr Drue Novel for 10/07/11.

## 2011-10-07 ENCOUNTER — Ambulatory Visit (INDEPENDENT_AMBULATORY_CARE_PROVIDER_SITE_OTHER): Payer: Medicare Other | Admitting: Internal Medicine

## 2011-10-07 ENCOUNTER — Encounter: Payer: Self-pay | Admitting: Family

## 2011-10-07 ENCOUNTER — Encounter: Payer: Self-pay | Admitting: Internal Medicine

## 2011-10-07 VITALS — BP 130/86 | HR 64 | Temp 98.2°F

## 2011-10-07 DIAGNOSIS — J40 Bronchitis, not specified as acute or chronic: Secondary | ICD-10-CM | POA: Insufficient documentation

## 2011-10-07 MED ORDER — ALBUTEROL SULFATE 0.63 MG/3ML IN NEBU: 1.0000 | INHALATION_SOLUTION | Freq: Four times a day (QID) | RESPIRATORY_TRACT | Status: DC | PRN

## 2011-10-07 NOTE — Patient Instructions (Signed)
Use the nebulizer 4 times a day for 5 days and then as needed for cough and congestion; stop the inhaler Finish antibiotics as prescribed Robitussin-DM 2 teaspoons 4 times a day for 5 days and then as needed I recommend  to inflate party  balloons 10 times morning afternoon and  At bedtime ER if symptoms severe Please see your  PCP next week

## 2011-10-07 NOTE — Progress Notes (Signed)
  Subjective:    Patient ID: Norma Ford, female    DOB: 1930-05-27, 77 y.o.   MRN: [id number]  HPI Chart reviewed, f/u from recent visit 10-05-11 Good compliance w/ zpack , using albuterol q 6 hours   Symptoms are about the same, still having dry cough, chest congestion and shortness of breath. No feeling better or worse.  Past Medical History  Diagnosis Date  . Hyperlipidemia   . HTN (hypertension)   . Cataracts, bilateral   . History of abnormal mammogram     2001-h/o abnormal mammo-told just calcium deposit-repeat  . 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 Kidney stone   . Barrett's esophagus 1999  . THROMBOCYTOPENIA 12/21/2009  . MYCOSIS FUNGOIDES LYMPH NODES MULTIPLE SITES 09/21/2009  . DECUBITUS ULCER, BUTTOCK 12/21/2009     Review of Systems Continue with subjective fever. No nausea, vomiting, diarrhea Again denies history of previous tobacco abuse, no history of asthma. Lower extremity edema at baseline    Objective:   Physical Exam  Constitutional: She appears well-developed. No distress.  HENT:  Head: Normocephalic and atraumatic.       Face is symmetric, not tender to palpation  Cardiovascular: Normal rate, regular rhythm and normal heart sounds.   Pulmonary/Chest:       No increased work of breathing, a few rhonchi with cough, some large airway congestion, prolonged expiration  Musculoskeletal:       1+ edema bilaterally  Skin: She is not diaphoretic.      Assessment & Plan:  Today , I spent more than 40  min with the patient, she was examined before and after neb and >50% of the time counseling about plan of care

## 2011-10-07 NOTE — Assessment & Plan Note (Signed)
Chest xray is performed and is negative for pneumonia or any pulmonary edema.  Pt was given a nebulizer in the office and responded well.  Her oxygen saturation rose from 91 to 96%.  I have instructed her to continue an albuterol inhaler every 6 hours for the next few days and to start empiric zithromax.  She is a non-smoker and has no overt wheezing on exam so will hold off on any steroids at this time, but plan for close 48 hour follow up in the office.

## 2011-10-07 NOTE — Assessment & Plan Note (Addendum)
Patient demonstrated to me her technique using albuterol inhaler and his poor despite coaching. Then she received one albuterol neb, she tolerated well, O2 sat went up to 96%. On exam after albuterol she was moving air very good, large airway congestion decrease. Plan: Nebulization 4 times a day for 5 days then as needed Finish antibiotics See instructions

## 2011-10-14 ENCOUNTER — Ambulatory Visit: Payer: Medicare Other | Admitting: Family Medicine

## 2011-10-20 ENCOUNTER — Ambulatory Visit (INDEPENDENT_AMBULATORY_CARE_PROVIDER_SITE_OTHER): Payer: Medicare Other | Admitting: Family Medicine

## 2011-10-20 ENCOUNTER — Telehealth: Payer: Self-pay | Admitting: Family Medicine

## 2011-10-20 ENCOUNTER — Encounter: Payer: Self-pay | Admitting: Family Medicine

## 2011-10-20 DIAGNOSIS — Z0000 Encounter for general adult medical examination without abnormal findings: Secondary | ICD-10-CM

## 2011-10-20 DIAGNOSIS — R0902 Hypoxemia: Secondary | ICD-10-CM | POA: Insufficient documentation

## 2011-10-20 DIAGNOSIS — M1990 Unspecified osteoarthritis, unspecified site: Secondary | ICD-10-CM

## 2011-10-20 DIAGNOSIS — I10 Essential (primary) hypertension: Secondary | ICD-10-CM

## 2011-10-20 DIAGNOSIS — E785 Hyperlipidemia, unspecified: Secondary | ICD-10-CM

## 2011-10-20 LAB — HEPATIC FUNCTION PANEL
ALT: 11 U/L (ref 0–35)
AST: 18 U/L (ref 0–37)
Bilirubin, Direct: 0.1 mg/dL (ref 0.0–0.3)
Total Bilirubin: 0.6 mg/dL (ref 0.3–1.2)

## 2011-10-20 LAB — POCT URINALYSIS DIPSTICK
Blood, UA: NEGATIVE
Glucose, UA: NEGATIVE
Nitrite, UA: NEGATIVE
Urobilinogen, UA: 0.2

## 2011-10-20 LAB — CBC WITH DIFFERENTIAL/PLATELET
Eosinophils Relative: 3.6 % (ref 0.0–5.0)
Monocytes Relative: 8.6 % (ref 3.0–12.0)
Neutrophils Relative %: 73.9 % (ref 43.0–77.0)
Platelets: 170 10*3/uL (ref 150.0–400.0)
WBC: 8.3 10*3/uL (ref 4.5–10.5)

## 2011-10-20 LAB — LIPID PANEL
LDL Cholesterol: 50 mg/dL (ref 0–99)
Total CHOL/HDL Ratio: 3
Triglycerides: 77 mg/dL (ref 0.0–149.0)

## 2011-10-20 LAB — BASIC METABOLIC PANEL
CO2: 31 mEq/L (ref 19–32)
Chloride: 100 mEq/L (ref 96–112)
Creatinine, Ser: 0.8 mg/dL (ref 0.4–1.2)
Potassium: 3.6 mEq/L (ref 3.5–5.1)
Sodium: 140 mEq/L (ref 135–145)

## 2011-10-20 MED ORDER — ALBUTEROL SULFATE 0.63 MG/3ML IN NEBU: 1.0000 | INHALATION_SOLUTION | Freq: Four times a day (QID) | RESPIRATORY_TRACT | Status: DC | PRN

## 2011-10-20 MED ORDER — HYDROCODONE-ACETAMINOPHEN 5-500 MG PO TABS: 1.0000 | ORAL_TABLET | Freq: Four times a day (QID) | ORAL | Status: DC | PRN

## 2011-10-20 NOTE — Progress Notes (Signed)
Subjective:    Norma Ford is a 77 y.o. female who presents for Medicare Annual/Subsequent preventive examination.  Preventive Screening-Counseling & Management  Tobacco History  Smoking status  . Never Smoker   Smokeless tobacco  . Never Used     Problems Prior to Visit 1.   Current Problems (verified) Patient Active Problem List  Diagnoses  . MYCOSIS FUNGOIDES LYMPH NODES MULTIPLE SITES  . HYPERLIPIDEMIA  . THROMBOCYTOPENIA  . HYPERTENSION  . FEMALE STRESS INCONTINENCE  . DECUBITUS ULCER, BUTTOCK  . DEGENERATIVE JOINT DISEASE, KNEE  . SHOULDER PAIN, RIGHT  . Bronchitis    Medications Prior to Visit Current Outpatient Prescriptions on File Prior to Visit  Medication Sig Dispense Refill  . albuterol (ACCUNEB) 0.63 MG/3ML nebulizer solution Take 3 mLs (0.63 mg total) by nebulization every 6 (six) hours as needed.  75 mL  0  . albuterol (PROVENTIL,VENTOLIN) 90 MCG/ACT inhaler Inhale 2 puffs into the lungs every 6 (six) hours as needed for wheezing.  17 g  0  . Alum & Mag Hydroxide-Simeth (MAGIC MOUTHWASH) SOLN Take by mouth.        . Azelastine HCl (ASTEPRO) 0.15 % SOLN Place into the nose.        . furosemide (LASIX) 40 MG tablet Take 40 mg by mouth 2 (two) times daily.        . Glucosamine & Fish Oil 500-400-60-40 MG CAPS Take by mouth.        . metoprolol (TOPROL-XL) 50 MG 24 hr tablet Take 50 mg by mouth daily.        . mometasone (NASONEX) 50 MCG/ACT nasal spray Place 2 sprays into the nose daily.        . Multiple Vitamin (MULTIVITAMIN) tablet Take 1 tablet by mouth daily.        . potassium chloride SA (K-DUR,KLOR-CON) 20 MEQ tablet Take 20 mEq by mouth 2 (two) times daily.        . simvastatin (ZOCOR) 80 MG tablet Take 80 mg by mouth at bedtime.        . traMADol (ULTRAM) 50 MG tablet Take 1-2 tablets twice a day. Maximum dose= 8 tablets per day       . valACYclovir (VALTREX) 1000 MG tablet Take 1,000 mg by mouth 2 (two) times daily.        Marland Kitchen  HYDROcodone-acetaminophen (VICODIN) 5-500 MG per tablet Take 1 tablet by mouth every 6 (six) hours as needed.          Current Medications (verified) Current Outpatient Prescriptions  Medication Sig Dispense Refill  . albuterol (ACCUNEB) 0.63 MG/3ML nebulizer solution Take 3 mLs (0.63 mg total) by nebulization every 6 (six) hours as needed.  75 mL  0  . albuterol (PROVENTIL,VENTOLIN) 90 MCG/ACT inhaler Inhale 2 puffs into the lungs every 6 (six) hours as needed for wheezing.  17 g  0  . Alum & Mag Hydroxide-Simeth (MAGIC MOUTHWASH) SOLN Take by mouth.        . Azelastine HCl (ASTEPRO) 0.15 % SOLN Place into the nose.        . furosemide (LASIX) 40 MG tablet Take 40 mg by mouth 2 (two) times daily.        . Glucosamine & Fish Oil 500-400-60-40 MG CAPS Take by mouth.        . metoprolol (TOPROL-XL) 50 MG 24 hr tablet Take 50 mg by mouth daily.        . mometasone (NASONEX) 50 MCG/ACT nasal spray  Place 2 sprays into the nose daily.        . Multiple Vitamin (MULTIVITAMIN) tablet Take 1 tablet by mouth daily.        . potassium chloride SA (K-DUR,KLOR-CON) 20 MEQ tablet Take 20 mEq by mouth 2 (two) times daily.        . simvastatin (ZOCOR) 80 MG tablet Take 80 mg by mouth at bedtime.        . traMADol (ULTRAM) 50 MG tablet Take 1-2 tablets twice a day. Maximum dose= 8 tablets per day       . valACYclovir (VALTREX) 1000 MG tablet Take 1,000 mg by mouth 2 (two) times daily.        Marland Kitchen HYDROcodone-acetaminophen (VICODIN) 5-500 MG per tablet Take 1 tablet by mouth every 6 (six) hours as needed.           Allergies (verified) Codeine   PAST HISTORY  Family History History reviewed. No pertinent family history.  Social History History  Substance Use Topics  . Smoking status: Never Smoker   . Smokeless tobacco: Never Used  . Alcohol Use: Not on file     Are there smokers in your home (other than you)? No  Risk Factors Current exercise habits: The patient does not participate in regular  exercise at present.  Dietary issues discussed: na   Cardiac risk factors: advanced age (older than 66 for men, 8 for women), dyslipidemia, hypertension, obesity (BMI >= 30 kg/m2) and sedentary lifestyle.  Depression Screen (Note: if answer to either of the following is "Yes", a more complete depression screening is indicated)   Over the past two weeks, have you felt down, depressed or hopeless? Yes  Over the past two weeks, have you felt little interest or pleasure in doing things? Yes  Have you lost interest or pleasure in daily life? Yes  Do you often feel hopeless? No  Do you cry easily over simple problems? Yes  Activities of Daily Living In your present state of health, do you have any difficulty performing the following activities?:  Driving? Yes Managing money?  No Feeding yourself? No Getting from bed to chair? No2 Climbing a flight of stairs? Yes Preparing food and eating?: No Bathing or showering? No Getting dressed: No Getting to the toilet? No Using the toilet:No Moving around from place to place: No In the past year have you fallen or had a near fall?:No   Are you sexually active?  No  Do you have more than one partner?  No  Hearing Difficulties: No Do you often ask people to speak up or repeat themselves? No Do you experience ringing or noises in your ears? No Do you have difficulty understanding soft or whispered voices? No   Do you feel that you have a problem with memory? No  Do you often misplace items? No  Do you feel safe at home?  Yes  Cognitive Testing  Alert? Yes  Normal Appearance?Yes  Oriented to person? Yes  Place? Yes   Time? Yes  Recall of three objects?  Yes  Can perform simple calculations? Yes  Displays appropriate judgment?Yes  Can read the correct time from a watch face?Yes   Advanced Directives have been discussed with the patient? Yes  List the Names of Other Physician/Practitioners you currently use: 1.  Ortho--  duda  Indicate any recent Medical Services you may have received from other than [facility] providers in the past year (date may be approximate).  Immunization History  Administered  Date(s) Administered  . Influenza Whole 09/23/2010  . Pneumococcal Polysaccharide 09/23/2010    Screening Tests Health Maintenance  Topic Date Due  . Tetanus/tdap  [birth year]/1951  . Colonoscopy  07/13/1982  . Zostavax  07/13/1992  . Influenza Vaccine  08/08/2011  . Pneumococcal Polysaccharide Vaccine Age 29 And Over  Completed    All answers were reviewed with the patient and necessary referrals were made:  Loreen Freud, DO   10/20/2011   History reviewed: allergies, current medications, past family history, past medical history, past social history, past surgical history and problem list  Review of Systems  Review of Systems  Constitutional: Negative for activity change, appetite change and fatigue.  HENT: Negative for hearing loss, congestion, tinnitus and ear discharge.   Eyes: Negative for visual disturbance (see optho q1y -- vision corrected to 20/20 with glasses).  Respiratory: Negative for cough, chest tightness and shortness of breath.   Cardiovascular: Negative for chest pain, palpitations and leg swelling.  Gastrointestinal: Negative for abdominal pain, diarrhea, constipation and abdominal distention.  Genitourinary: Negative for urgency, frequency, decreased urine volume and difficulty urinating.  Musculoskeletal:  _+ arthralgias,  Walks with walker Skin: Negative for color change, pallor and rash.  Neurological: Negative for dizziness, light-headedness, numbness and headaches.  Hematological: Negative for adenopathy. Does not bruise/bleed easily.  Psychiatric/Behavioral: Negative for suicidal ideas, confusion, sleep disturbance, self-injury, dysphoric mood, decreased concentration and agitation. + depression Pt is able to read and write and can do all ADLs + fall risk-- walks with walker No abuse/  violence in home      Objective:     Vision by Snellen chart: right NWG:NFAOZHY declines measurement, left QMV:HQIONGE declines measurement  Body mass index is 47.90 kg/(m^2). BP 118/74  Pulse 60  Temp(Src) 97.6 F (36.4 C) (Oral)  Ht 5\' 3"  (1.6 m)  Wt 270 lb 6.4 oz (122.653 kg)  BMI 47.90 kg/m2  SpO2 90%  BP 118/74  Pulse 60  Temp(Src) 97.6 F (36.4 C) (Oral)  Ht 5\' 3"  (1.6 m)  Wt 270 lb 6.4 oz (122.653 kg)  BMI 47.90 kg/m2  SpO2 90%--------initial pulse ox at rest 86% General appearance: alert, cooperative, appears older than stated age and no distress Head: Normocephalic, without obvious abnormality, atraumatic Eyes: conjunctivae/corneas clear. PERRL, EOM's intact. Fundi benign. Ears: normal TM's and external ear canals both ears Nose: Nares normal. Septum midline. Mucosa normal. No drainage or sinus tenderness. Throat: lips, mucosa, and tongue normal; teeth and gums normal Neck: no adenopathy, no carotid bruit, no JVD, supple, symmetrical, trachea midline and thyroid not enlarged, symmetric, no tenderness/mass/nodules Back: symmetric, no curvature. ROM normal. No CVA tenderness. Lungs: clear to auscultation bilaterally Breasts: normal appearance, no masses or tenderness Heart: regular rate and rhythm, S1, S2 normal, no murmur, click, rub or gallop Abdomen: soft, non-tender; bowel sounds normal; no masses,  no organomegaly Pelvic: not indicated; post-menopausal, no abnormal Pap smears in past Extremities: extremities normal, atraumatic, no cyanosis or edema Pulses: 2+ and symmetric Skin: Skin color, texture, turgor normal. No rashes or lesions Lymph nodes: Cervical, supraclavicular, and axillary nodes normal. Neurologic: Alert and oriented X 3, normal strength and tone. Normal symmetric reflexes. Normal coordination and gait---with walker Psych---+ depression--pt refusing meds     Assessment:     cpe Hypoxia-- new, no chest pain, sob etc,  cxr done previously is  neg,  Refer to pulm htn Hyperlipidemia Mycosis fungoides --per onc     Plan:     During the course of the  visit the patient was educated and counseled about appropriate screening and preventive services including:    Pneumococcal vaccine   Influenza vaccine  Screening mammography  Bone densitometry screening  Colorectal cancer screening  Advanced directives: has an advanced directive - a copy HAS NOT been provided.  Diet review for nutrition referral? Yes ____  Not Indicated ____x   Patient Instructions (the written plan) was given to the patient.  Medicare Attestation I have personally reviewed: The patient's medical and social history Their use of alcohol, tobacco or illicit drugs Their current medications and supplements The patient's functional ability including ADLs,fall risks, home safety risks, cognitive, and hearing and visual impairment Diet and physical activities Evidence for depression or mood disorders  The patient's weight, height, BMI, and visual acuity have been recorded in the chart.  I have made referrals, counseling, and provided education to the patient based on review of the above and I have provided the patient with a written personalized care plan for preventive services.     Loreen Freud, DO   10/20/2011

## 2011-10-20 NOTE — Patient Instructions (Signed)

## 2011-10-20 NOTE — Telephone Encounter (Signed)
error 

## 2011-10-20 NOTE — Progress Notes (Addendum)
  Subjective:    Patient ID: Norma Ford, female    DOB: [birth year], 77 y.o.   MRN: [id number]  HPI    Review of Systems     Objective:   Physical Exam        Assessment & Plan:  Hypoxia--- oxygen ordered

## 2011-10-21 ENCOUNTER — Telehealth: Payer: Self-pay | Admitting: Family Medicine

## 2011-10-21 NOTE — Telephone Encounter (Signed)
Discussed with patient and she stated they showed her how to use it and will be back on Thursday to do something else with her. I advised if she had any questions give them a call and if they need to come back to her they will and she agreed    KP

## 2011-10-21 NOTE — Telephone Encounter (Signed)
Patient calling, states oxygen was brought out to her home yesterday, 10-20-11, and she has been given no instructions on how to use it.  Please advise.

## 2011-10-28 ENCOUNTER — Encounter: Payer: Self-pay | Admitting: Internal Medicine

## 2011-10-28 ENCOUNTER — Ambulatory Visit (INDEPENDENT_AMBULATORY_CARE_PROVIDER_SITE_OTHER): Payer: Medicare Other | Admitting: Internal Medicine

## 2011-10-28 ENCOUNTER — Other Ambulatory Visit (INDEPENDENT_AMBULATORY_CARE_PROVIDER_SITE_OTHER): Payer: Medicare Other

## 2011-10-28 VITALS — BP 120/76 | HR 69 | Ht 62.0 in | Wt 265.6 lb

## 2011-10-28 DIAGNOSIS — R0600 Dyspnea, unspecified: Secondary | ICD-10-CM

## 2011-10-28 DIAGNOSIS — J40 Bronchitis, not specified as acute or chronic: Secondary | ICD-10-CM

## 2011-10-28 DIAGNOSIS — R0609 Other forms of dyspnea: Secondary | ICD-10-CM

## 2011-10-28 DIAGNOSIS — R0902 Hypoxemia: Secondary | ICD-10-CM

## 2011-10-28 DIAGNOSIS — R0989 Other specified symptoms and signs involving the circulatory and respiratory systems: Secondary | ICD-10-CM

## 2011-10-28 MED ORDER — BENZONATATE 200 MG PO CAPS: 200.0000 mg | ORAL_CAPSULE | Freq: Three times a day (TID) | ORAL | Status: DC | PRN

## 2011-10-28 MED ORDER — METHYLPREDNISOLONE ACETATE 80 MG/ML IJ SUSP: 80.0000 mg | Freq: Once | INTRAMUSCULAR | Status: DC

## 2011-10-28 NOTE — Patient Instructions (Addendum)
Neb alb 0.083  Depo 80  Order lab- BNP dx dyspnea                  D-dimer   Use the oxygen as much of the time as you can- 2 L/m. Suggest you call Christoper Allegra and say it is ok for them to come back out and check on things as they planned the other day.   Script for benzonatate pearls for cough sent to drug store

## 2011-10-28 NOTE — Progress Notes (Signed)
10/28/11- never smoker here for pulmonary evaluation at the request of Dr.Lowne, concerned about bronchitis, coughing and shortness of breath. Her husband is with her. She does not get the flu vaccine. She complains of a dry cough for the past 2 weeks. She has felt unusually tired for several months with "hot flashes" but no distinct fever. She feels a rattle in her chest with occasional mild wheeze. Oxygen desaturation to 89% has been documented. She denies history of pneumonia, venous thromboembolism or heart failure. She gives history of mycosis fungoides hypertension, multiple knee surgeries including bilateral total knee replacement. Home oxygen from [facility] at 2 L per minute. Christoper Allegra came to make a home visit following initiation of oxygen, but she sent them away pending this visit with me today.  ROS-see HPI Constitutional:   No-   weight loss, night sweats, fevers, chills, fatigue, lassitude. HEENT:   +  headaches,  No-difficulty swallowing, tooth/dental problems, sore throat,       No-  sneezing, itching, ear ache, nasal congestion, post nasal drip,  CV:  No-   chest pain, orthopnea, PND, swelling in lower extremities, anasarca, dizziness, palpitations Resp: + shortness of breath with exertion or at rest.              No-   productive cough,  + non-productive cough,  No- coughing up of blood.              No-   change in color of mucus.  No- wheezing.   Skin: No-   rash or lesions. GI:  No-   heartburn, indigestion, abdominal pain, nausea, vomiting, diarrhea,                 change in bowel habits,  +loss of appetite GU: No-   dysuria, change in color of urine, no urgency or frequency.  No- flank pain. MS:  No-   joint pain or swelling.  No- decreased range of motion.  No- back pain. Neuro-     nothing unusual Psych:  No- change in mood or affect. No depression or anxiety.  No memory loss.  OBJ General- Alert, Oriented, Affect- tired, passive or depressed, Distress- none acute, very obese,  using a walker. O2 saturation 86% on room air on arrival, improved to 95% on our 2 L oxygen. Skin- rash-none, lesions- none, excoriation- none Lymphadenopathy- none Head- atraumatic            Eyes- Gross vision intact, PERRLA, conjunctivae clear secretions            Ears- Hearing, canals-normal            Nose- Clear, no-Septal dev, mucus, polyps, erosion, perforation             Throat- Mallampati II , mucosa clear , drainage- none, tonsils- atrophic Neck- flexible , trachea midline, no stridor , thyroid nl, carotid no bruit Chest - symmetrical excursion , unlabored           Heart/CV- RRR , no murmur , no gallop  , no rub, nl s1 s2                           - JVD- none , edema- 2 to 3+, stasis changes- none, varices- none           Lung- shallow with a few crackles, wheeze- none, cough- none , dullness-none, rub- none  Chest wall-  Abd- tender-no, distended-no, bowel sounds-present, HSM- no Br/ Gen/ Rectal- Not done, not indicated Extrem- cyanosis- none, clubbing, none, atrophy- none,  very heavy legs Neuro- grossly intact to observation

## 2011-10-29 LAB — D-DIMER, QUANTITATIVE: D-Dimer, Quant: 2.26 ug/mL-FEU — ABNORMAL HIGH (ref 0.00–0.48)

## 2011-10-31 ENCOUNTER — Other Ambulatory Visit: Payer: Self-pay | Admitting: Internal Medicine

## 2011-10-31 ENCOUNTER — Ambulatory Visit (HOSPITAL_COMMUNITY)
Admission: RE | Admit: 2011-10-31 | Discharge: 2011-10-31 | Disposition: A | Discharge status: Home / Self Care | Payer: Medicare Other | Source: Ambulatory Visit | Attending: Internal Medicine | Admitting: Internal Medicine

## 2011-10-31 ENCOUNTER — Encounter (HOSPITAL_COMMUNITY): Payer: Self-pay

## 2011-10-31 DIAGNOSIS — R05 Cough: Secondary | ICD-10-CM | POA: Insufficient documentation

## 2011-10-31 DIAGNOSIS — R7989 Other specified abnormal findings of blood chemistry: Secondary | ICD-10-CM

## 2011-10-31 DIAGNOSIS — R0602 Shortness of breath: Secondary | ICD-10-CM

## 2011-10-31 DIAGNOSIS — K8020 Calculus of gallbladder without cholecystitis without obstruction: Secondary | ICD-10-CM | POA: Insufficient documentation

## 2011-10-31 DIAGNOSIS — R059 Cough, unspecified: Secondary | ICD-10-CM | POA: Insufficient documentation

## 2011-10-31 DIAGNOSIS — R791 Abnormal coagulation profile: Secondary | ICD-10-CM | POA: Insufficient documentation

## 2011-10-31 DIAGNOSIS — I517 Cardiomegaly: Secondary | ICD-10-CM | POA: Insufficient documentation

## 2011-10-31 LAB — BRAIN NATRIURETIC PEPTIDE: Pro B Natriuretic peptide (BNP): 38 pg/mL (ref 0.0–100.0)

## 2011-10-31 MED ORDER — IOHEXOL 300 MG/ML  SOLN
100.0000 mL | Freq: Once | INTRAMUSCULAR | Status: AC | PRN
  Administered 2011-10-31: 100 mL via INTRAVENOUS

## 2011-10-31 NOTE — Progress Notes (Signed)
Quick Note:  Pt and spouse aware of labs and orders placed STAT. ______

## 2011-11-02 ENCOUNTER — Telehealth: Payer: Self-pay | Admitting: Internal Medicine

## 2011-11-02 ENCOUNTER — Telehealth: Payer: Self-pay | Admitting: Family Medicine

## 2011-11-02 MED ORDER — ALBUTEROL 90 MCG/ACT IN AERS: 2.0000 | INHALATION_SPRAY | Freq: Four times a day (QID) | RESPIRATORY_TRACT | Status: DC | PRN

## 2011-11-02 NOTE — Assessment & Plan Note (Signed)
She describes rather nonspecific onset of cough 2 weeks ago. At this time of year, a viral syndrome is likely. Her body habitus makes obesity hypoventilation syndrome likely, but that would not produce an acute onset. Heart failure and pulmonary embolism can be sought with d-dimer and BNP She suggests that she may have had a little bit of wheezing, not apparent on exam now. That would affect decision to try bronchodilators. I will first try to relieve cough with benzonatate while we wait labs.

## 2011-11-02 NOTE — Assessment & Plan Note (Signed)
See discussion of bronchitis below. Chronic hypoxia may be the result of obesity hypoventilation syndrome but we need to understand her cardiopulmonary function better.

## 2011-11-02 NOTE — Telephone Encounter (Signed)
Patient needs refill albuterol - [facility] [address]

## 2011-11-02 NOTE — Telephone Encounter (Signed)
I called patient to let her know that CT and leg Dopplers were negative for VTE/clot, ordered because of elevated D-dimer.

## 2011-11-03 ENCOUNTER — Other Ambulatory Visit: Payer: Self-pay

## 2011-11-03 ENCOUNTER — Other Ambulatory Visit: Payer: Self-pay | Admitting: *Deleted

## 2011-11-03 ENCOUNTER — Telehealth: Payer: Self-pay | Admitting: Internal Medicine

## 2011-11-03 DIAGNOSIS — R0902 Hypoxemia: Secondary | ICD-10-CM

## 2011-11-03 DIAGNOSIS — J42 Unspecified chronic bronchitis: Secondary | ICD-10-CM | POA: Insufficient documentation

## 2011-11-03 MED ORDER — ALBUTEROL SULFATE 0.63 MG/3ML IN NEBU: 1.0000 | INHALATION_SOLUTION | Freq: Four times a day (QID) | RESPIRATORY_TRACT | Status: DC | PRN

## 2011-11-03 MED ORDER — ALBUTEROL SULFATE (2.5 MG/3ML) 0.083% IN NEBU: 2.5000 mg | INHALATION_SOLUTION | Freq: Four times a day (QID) | RESPIRATORY_TRACT | Status: DC | PRN

## 2011-11-03 NOTE — Telephone Encounter (Signed)
Called and spoke with pt and she is aware that those evaluations are sent out at random.  She is aware that we have no way to send her another one.  Pt voiced her understanding of this.

## 2011-11-03 NOTE — Telephone Encounter (Signed)
Called and spoke with pts son and he is aware of rx for the albuterol that has been sent to the pts pharmacy,.

## 2011-11-03 NOTE — Telephone Encounter (Signed)
Called and spoke with pts son and he stated that the albuterol was last filled by Dr. Laury Axon but the pharmacy is charging them full price for the med since the dx code was the wrong code.  Dr. Laury Axon is out of the office this week and pts son is requesting that we send in refills for the albuterol since the pt is out of this med.  Please advise. Thanks  Requesting rx [facility] on [address].

## 2011-11-07 NOTE — Progress Notes (Signed)
Quick Note:  Pt aware of results. ______ 

## 2011-11-15 ENCOUNTER — Telehealth: Payer: Self-pay | Admitting: Internal Medicine

## 2011-11-15 NOTE — Telephone Encounter (Signed)
I spoke with pt and she states she can't remember if she put down mycosis fungoides on her consult sheet when she came in and saw CDY. I advised pt it was on her problem list. Pt needed nothing further

## 2011-11-21 ENCOUNTER — Telehealth: Payer: Self-pay | Admitting: Internal Medicine

## 2011-11-21 ENCOUNTER — Other Ambulatory Visit: Payer: Self-pay | Admitting: Family Medicine

## 2011-11-21 MED ORDER — PREDNISONE 10 MG PO TABS: ORAL_TABLET | ORAL | Status: DC

## 2011-11-21 NOTE — Telephone Encounter (Signed)
Pt says her cough is worse and non-productive. She c/o fatigue and says her oxygen level is at 89% is the mornings. She is checking this on room air. Pt is scheduled for f/u on Mon., 1/221 but would like recs of what she can do in the meantime. Pls advise. Allergies  Allergen Reactions  . Codeine     REACTION: ITCHING

## 2011-11-21 NOTE — Telephone Encounter (Signed)
Last seen and filled 10/20/11 #60. Please advise    KP

## 2011-11-21 NOTE — Telephone Encounter (Signed)
ATC line busy x 3 wcb 

## 2011-11-21 NOTE — Telephone Encounter (Signed)
I spoke with pt and is aware of cdy recs. Rx has been sent and pt aware of directions. Nothing further was needed

## 2011-11-21 NOTE — Telephone Encounter (Signed)
Per Cy-okay to give Prednisone 10 mg #20 take 4 x 2 days, 3 x 2 days, 2 x 2 days, 1 x 2 days, then stop no refills.  

## 2011-11-28 ENCOUNTER — Encounter: Payer: Self-pay | Admitting: Internal Medicine

## 2011-11-28 ENCOUNTER — Ambulatory Visit (INDEPENDENT_AMBULATORY_CARE_PROVIDER_SITE_OTHER): Payer: Medicare Other | Admitting: Internal Medicine

## 2011-11-28 VITALS — BP 118/80 | HR 67 | Ht 62.0 in | Wt 266.8 lb

## 2011-11-28 DIAGNOSIS — R0902 Hypoxemia: Secondary | ICD-10-CM

## 2011-11-28 DIAGNOSIS — J42 Unspecified chronic bronchitis: Secondary | ICD-10-CM

## 2011-11-28 NOTE — Assessment & Plan Note (Signed)
She felt that her breathing improved dramatically with again a prednisone taper. That would suggest an inflammatory or bronchospastic process. She feels much improved now and oxygen saturation is normal on room air. We will begin an organized cut back her oxygen therapy. She has not been consistent with oxygen use but may still need it during sleep.

## 2011-11-28 NOTE — Assessment & Plan Note (Signed)
Most of her respiratory problems now were flexor obesity with hypoventilation and a chronic bronchitis. Any bronchitis  has improved with recent steroid therapy. She will follow her oximeter.

## 2011-11-28 NOTE — Progress Notes (Signed)
10/28/11- never smoker here for pulmonary evaluation at the request of Dr.Lowne, concerned about bronchitis, coughing and shortness of breath. Her husband is with her. She does not get the flu vaccine. She complains of a dry cough for the past 2 weeks. She has felt unusually tired for several months with "hot flashes" but no distinct fever. She feels a rattle in her chest with occasional mild wheeze. Oxygen desaturation to 89% has been documented. She denies history of pneumonia, venous thromboembolism or heart failure. She gives history of mycosis fungoides hypertension, multiple knee surgeries including bilateral total knee replacement. Home oxygen from [facility] at 2 L per minute. Christoper Allegra came to make a home visit following initiation of oxygen, but she sent them away pending this visit with me today.  11/28/11- [name] never smoker followed for  bronchitis, coughing and shortness of breath. Her husband is with her. She does not get the flu vaccine. Breathing seems to be worse after nebulizer tx's. Could tell a BIG difference in breathing and overall mood while on Prednisone; Tessalon pearls working well for patient. Today is the last day of a slow prednisone taper. She got a home oximeter. Still has oxygen/[employer]/2 L, but is using it only intermittently and left it at home. We are looking for an overnight oximetry report from [place].  Lab 10/28/11- D.Dimer 2.26, ProBNP 38.0 CT chest for PE r/o 10/31/11- images reviewed w/ her- Findings: No filling defects in the pulmonary arteries to suggest  pulmonary emboli. Heart is borderline enlarged. Scattered  coronary artery calcifications. Aorta is normal caliber. No  mediastinal, hilar, or axillary adenopathy. Visualized thyroid and  chest wall soft tissues unremarkable.  No confluent airspace opacities. No pleural effusions.  Imaging into the upper abdomen shows no acute findings. Small low-  density nodule in the right adrenal gland measures found Hounsfield    units, compatible with adenoma. Small gallstone visualized.  Review of the MIP images confirms the above findings.  IMPRESSION:  No evidence of pulmonary embolus.  Cardiomegaly.  Cholelithiasis.  Original Report Authenticated By: Cyndie Chime, M.D.    ROS-see HPI Constitutional:   No-   weight loss, night sweats, fevers, chills, fatigue, lassitude. HEENT:   +  headaches,  No-difficulty swallowing, tooth/dental problems, sore throat,       No-  sneezing, itching, ear ache, nasal congestion, post nasal drip,  CV:  No-   chest pain, orthopnea, PND, swelling in lower extremities, anasarca, dizziness, palpitations Resp: + shortness of breath with exertion or at rest.              No-   productive cough,  + non-productive cough,  No- coughing up of blood.              No-   change in color of mucus.  No- wheezing.   Skin: No-   rash or lesions. GI:  No-   heartburn, indigestion, abdominal pain, nausea, vomiting, diarrhea,                 change in bowel habits,  +loss of appetite GU:  MS:  No-   joint pain or swelling.  No- decreased range of motion.  No- back pain. Neuro-     nothing unusual Psych:  No- change in mood or affect. No depression or anxiety.  No memory loss.  OBJ General- Alert, Oriented, Affect-  More positive, Distress- none acute, very obese, using a walker.  Skin- rash-none, lesions- none, excoriation- none Lymphadenopathy- none  Head- atraumatic            Eyes- Gross vision intact, PERRLA, conjunctivae clear secretions            Ears- Hearing, canals-normal            Nose- Clear, no-Septal dev, mucus, polyps, erosion, perforation             Throat- Mallampati II , mucosa clear , drainage- none, tonsils- atrophic Neck- flexible , trachea midline, no stridor , thyroid nl, carotid no bruit Chest - symmetrical excursion , unlabored           Heart/CV- RRR , no murmur , no gallop  , no rub, nl s1 s2                           - JVD- none , edema- 1+, stasis changes-  none, varices- none           Lung- shallow with a few crackles, wheeze- none, cough- none , dullness-none, rub- none           Chest wall-  Abd-  Br/ Gen/ Rectal- Not done, not indicated Extrem- cyanosis- none, clubbing, none, atrophy- none,  very heavy legs Neuro- grossly intact to observation

## 2011-11-28 NOTE — Patient Instructions (Signed)
We will check for the report  your oxygen at night.   Use the oxygen as needed and for sleep, until we can advise you based on the overnight report  Use your nebulizer machine up to 4 times daily IF NEEDED.

## 2011-11-29 ENCOUNTER — Other Ambulatory Visit: Payer: Self-pay | Admitting: *Deleted

## 2011-11-29 MED ORDER — ALBUTEROL SULFATE (2.5 MG/3ML) 0.083% IN NEBU: 2.5000 mg | INHALATION_SOLUTION | Freq: Four times a day (QID) | RESPIRATORY_TRACT | Status: DC | PRN

## 2011-12-06 ENCOUNTER — Telehealth: Payer: Self-pay | Admitting: Family Medicine

## 2011-12-06 ENCOUNTER — Other Ambulatory Visit: Payer: Self-pay | Admitting: Internal Medicine

## 2011-12-06 MED ORDER — SIMVASTATIN 80 MG PO TABS: 80.0000 mg | ORAL_TABLET | Freq: Every day | ORAL | Status: DC

## 2011-12-06 MED ORDER — FUROSEMIDE 40 MG PO TABS: 40.0000 mg | ORAL_TABLET | Freq: Two times a day (BID) | ORAL | Status: DC

## 2011-12-06 MED ORDER — POTASSIUM CHLORIDE CRYS ER 20 MEQ PO TBCR: 20.0000 meq | EXTENDED_RELEASE_TABLET | Freq: Two times a day (BID) | ORAL | Status: DC

## 2011-12-06 MED ORDER — METOPROLOL SUCCINATE ER 50 MG PO TB24: 50.0000 mg | ORAL_TABLET | Freq: Every day | ORAL | Status: DC

## 2011-12-06 NOTE — Telephone Encounter (Signed)
Discussed with patient and she requested Zocor, lasix, metoprolol and potassium sent to [facility].      KP

## 2011-12-06 NOTE — Telephone Encounter (Signed)
Patient wanted to inform office that she is now using [facility] for her pharmacy  She does want a refill of simvastatin sent to [facility] on [address] because she states that she is almost out.

## 2011-12-07 ENCOUNTER — Other Ambulatory Visit: Payer: Self-pay | Admitting: Family Medicine

## 2011-12-07 ENCOUNTER — Encounter: Payer: Self-pay | Admitting: Internal Medicine

## 2011-12-07 NOTE — Telephone Encounter (Signed)
Please advise if okay to refill; Pt last seen on 11-28-2011 and stated Rx was helping her; however not on her med list. Thanks.

## 2011-12-07 NOTE — Telephone Encounter (Signed)
Ok to refill x 3 

## 2011-12-08 NOTE — Telephone Encounter (Signed)
RX sent

## 2012-01-03 ENCOUNTER — Other Ambulatory Visit: Payer: Self-pay | Admitting: Family Medicine

## 2012-01-03 NOTE — Telephone Encounter (Signed)
Last seen 12/20/10 and filled 11/21/11 # 60. Please advise    KP

## 2012-01-30 ENCOUNTER — Other Ambulatory Visit: Payer: Self-pay | Admitting: Family Medicine

## 2012-01-30 NOTE — Telephone Encounter (Signed)
Last seen 10/20/11 and filled 01/03/12 # 60. Please advise    KP

## 2012-02-27 ENCOUNTER — Other Ambulatory Visit: Payer: Self-pay | Admitting: Family Medicine

## 2012-02-28 NOTE — Telephone Encounter (Signed)
Last seen 11/07/11 and filled 3/25/113 # 60. Please advise.     KP

## 2012-02-28 NOTE — Telephone Encounter (Signed)
OK # 30 until Dr Laury Axon back ( age 78 )

## 2012-03-05 ENCOUNTER — Other Ambulatory Visit: Payer: Self-pay | Admitting: Family Medicine

## 2012-03-05 MED ORDER — HYDROCODONE-ACETAMINOPHEN 5-500 MG PO TABS: 1.0000 | ORAL_TABLET | Freq: Four times a day (QID) | ORAL | Status: DC | PRN

## 2012-03-05 NOTE — Telephone Encounter (Signed)
Ok for #60, no refills 

## 2012-03-05 NOTE — Telephone Encounter (Signed)
Request for Hydrocodone last filled 02/27/11 # 30 by Dr.Hopper. Please advise.     KP

## 2012-03-05 NOTE — Telephone Encounter (Signed)
Rx faxed.    KP 

## 2012-03-05 NOTE — Telephone Encounter (Signed)
Patient called for another Refill for  Hydrocodone-Acetaminophen (Tab) VICODIN 5-500 MG  take 1 tablet by mouth every 6 hours if needed for pain She stated it was filled on 4.22.13 but she only got a 7-day supply Patient states she is in a lot of pain and is in need of the rest of her medication Patient ph# [phone]

## 2012-03-15 ENCOUNTER — Telehealth: Payer: Self-pay | Admitting: *Deleted

## 2012-03-15 NOTE — Telephone Encounter (Signed)
Network difficulties, unable to complete the call, I will try again tomorrow.    KP

## 2012-03-15 NOTE — Telephone Encounter (Signed)
If she is seeing him---he can take over pain med while she is in his care

## 2012-03-15 NOTE — Telephone Encounter (Signed)
Please advise      KP 

## 2012-03-15 NOTE — Telephone Encounter (Signed)
Pt called to ask if Ortho MD Hilts has called in to speak with MD Lowne about increasing the pain medication for this pt per her noted during her recent office visit to have cortisone shots that she will need more Hydrocodone a month, pt was offered the medication by MD Hilts however stated she turned the offer down and told him that MD Laury Axon is in charge of her pain medication, this nurse advised that no calls were noted in the chart from MD Hilts and that she will need to call him back about the contact between him and MD Lowne, pt noted that she needs more medication per takes every 6 hours and not enough for her

## 2012-03-16 NOTE — Telephone Encounter (Signed)
Discussed with patient and she voiced understanding and she agreed to call ortho to increase her medications as he planned.    KP

## 2012-03-19 ENCOUNTER — Other Ambulatory Visit: Payer: Self-pay | Admitting: Family Medicine

## 2012-03-19 DIAGNOSIS — M1990 Unspecified osteoarthritis, unspecified site: Secondary | ICD-10-CM

## 2012-03-19 MED ORDER — HYDROCODONE-ACETAMINOPHEN 10-500 MG PO TABS: 1.0000 | ORAL_TABLET | Freq: Four times a day (QID) | ORAL | Status: AC | PRN

## 2012-03-19 NOTE — Telephone Encounter (Signed)
Office note from Dr.Hilts advising to increase RX. Please advise    KP

## 2012-03-19 NOTE — Telephone Encounter (Signed)
Patient aware RX faxed...      KP

## 2012-03-19 NOTE — Telephone Encounter (Signed)
New rx printed

## 2012-03-26 ENCOUNTER — Ambulatory Visit (INDEPENDENT_AMBULATORY_CARE_PROVIDER_SITE_OTHER): Payer: Medicare Other | Admitting: Internal Medicine

## 2012-03-26 ENCOUNTER — Encounter: Payer: Self-pay | Admitting: Internal Medicine

## 2012-03-26 DIAGNOSIS — J42 Unspecified chronic bronchitis: Secondary | ICD-10-CM

## 2012-03-26 NOTE — Patient Instructions (Signed)
Order- DME Christoper Allegra  D/C Home oxygen,  D/C Nebulizer

## 2012-03-26 NOTE — Progress Notes (Signed)
10/28/11- never smoker here for pulmonary evaluation at the request of Dr.Lowne, concerned about bronchitis, coughing and shortness of breath. Her husband is with her. She does not get the flu vaccine. She complains of a dry cough for the past 2 weeks. She has felt unusually tired for several months with "hot flashes" but no distinct fever. She feels a rattle in her chest with occasional mild wheeze. Oxygen desaturation to 89% has been documented. She denies history of pneumonia, venous thromboembolism or heart failure. She gives history of mycosis fungoides hypertension, multiple knee surgeries including bilateral total knee replacement. Home oxygen from [facility] at 2 L per minute. Christoper Allegra came to make a home visit following initiation of oxygen, but she sent them away pending this visit with me today.  11/28/11- [name] never smoker followed for  bronchitis, coughing and shortness of breath. Her husband is with her. She does not get the flu vaccine. Breathing seems to be worse after nebulizer tx's. Could tell a BIG difference in breathing and overall mood while on Prednisone; Tessalon pearls working well for patient. Today is the last day of a slow prednisone taper. She got a home oximeter. Still has oxygen/[facility]/2 L, but is using it only intermittently and left it at home. We are looking for an overnight oximetry report from [place].  Lab 10/28/11- D.Dimer 2.26, ProBNP 38.0 CT chest for PE r/o 10/31/11- images reviewed w/ her- Findings: No filling defects in the pulmonary arteries to suggest  pulmonary emboli. Heart is borderline enlarged. Scattered  coronary artery calcifications. Aorta is normal caliber. No  mediastinal, hilar, or axillary adenopathy. Visualized thyroid and  chest wall soft tissues unremarkable.  No confluent airspace opacities. No pleural effusions.  Imaging into the upper abdomen shows no acute findings. Small low-  density nodule in the right adrenal gland measures found Hounsfield    units, compatible with adenoma. Small gallstone visualized.  Review of the MIP images confirms the above findings.  IMPRESSION:  No evidence of pulmonary embolus.  Cardiomegaly.  Cholelithiasis.  Original Report Authenticated By: Cyndie Chime, M.D.   03/26/12-  77 yoF never smoker followed for  bronchitis, coughing and shortness of breath. Her husband is with her. She does not get the flu vaccine.  Pt states gets sob upon activity, denies any wheezing,chest congestion ,cough. been off  O2 at night for 1 month .  Oxygen saturations have been good at home on room air. She has gone 30 days now without needing oxygen or her nebulizer and feels significantly improved. She likes benzonatate for occasional use  ROS-see HPI Constitutional:   No-   weight loss, night sweats, fevers, chills, fatigue, lassitude. HEENT:   +  headaches,  No-difficulty swallowing, tooth/dental problems, sore throat,       No-  sneezing, itching, ear ache, nasal congestion, post nasal drip,  CV:  No-   chest pain, orthopnea, PND, swelling in lower extremities, anasarca, dizziness, palpitations Resp: Improved shortness of breath with exertion or at rest.              No-   productive cough,  + non-productive cough,  No- coughing up of blood.              No-   change in color of mucus.  No- wheezing.   Skin: No-   rash or lesions. GI:  No-   heartburn, indigestion, abdominal pain, nausea, vomiting, e GU:  MS:  No-   joint pain or swelling.  No- decreased range of motion.  No- back pain. Neuro-     nothing unusual Psych:  No- change in mood or affect. No depression or anxiety.  No memory loss.  OBJ General- Alert, Oriented, Affect-  More positive, Distress- none acute, very obese, using a walker.  Skin- rash-none, lesions- none, excoriation- none Lymphadenopathy- none Head- atraumatic            Eyes- Gross vision intact, PERRLA, conjunctivae clear secretions            Ears- Hearing, canals-normal             Nose- Clear, no-Septal dev, mucus, polyps, erosion, perforation             Throat- Mallampati II , mucosa clear , drainage- none, tonsils- atrophic Neck- flexible , trachea midline, no stridor , thyroid nl, carotid no bruit Chest - symmetrical excursion , unlabored           Heart/CV- RRR , no murmur , no gallop  , no rub, nl s1 s2                           - JVD- none , edema- trace, stasis changes- none, varices- none           Lung- shallow with a few crackles, wheeze- none, cough- none , dullness-none, rub- none           Chest wall-  Abd-  Br/ Gen/ Rectal- Not done, not indicated Extrem- cyanosis- none, clubbing, none, atrophy- none,  very heavy legs Neuro- grossly intact to observation

## 2012-03-30 NOTE — Assessment & Plan Note (Signed)
Significant improvement since last here. Oxygenation is improved. Plan-[facility] will stop her nebulizer machine and her home oxygen. Weight loss and regular walking for stamina would help her breathing considerably.

## 2012-04-16 ENCOUNTER — Other Ambulatory Visit: Payer: Self-pay | Admitting: Family Medicine

## 2012-04-16 NOTE — Telephone Encounter (Signed)
Last seen 10/20/11 and filled 03/05/12 # 60. Please advise     KP

## 2012-05-14 ENCOUNTER — Other Ambulatory Visit: Payer: Self-pay | Admitting: Family Medicine

## 2012-05-14 NOTE — Telephone Encounter (Signed)
Last OV 10-20-11, last filled 04-16-12 #60

## 2012-05-21 ENCOUNTER — Other Ambulatory Visit: Payer: Self-pay | Admitting: Cardiology

## 2012-05-21 ENCOUNTER — Encounter: Payer: Self-pay | Admitting: Family Medicine

## 2012-05-21 ENCOUNTER — Encounter (INDEPENDENT_AMBULATORY_CARE_PROVIDER_SITE_OTHER): Payer: Medicare Other

## 2012-05-21 ENCOUNTER — Ambulatory Visit (INDEPENDENT_AMBULATORY_CARE_PROVIDER_SITE_OTHER): Payer: Medicare Other | Admitting: Family Medicine

## 2012-05-21 VITALS — BP 126/80 | HR 75 | Temp 98.1°F | Wt 271.6 lb

## 2012-05-21 DIAGNOSIS — N390 Urinary tract infection, site not specified: Secondary | ICD-10-CM

## 2012-05-21 DIAGNOSIS — R609 Edema, unspecified: Secondary | ICD-10-CM | POA: Insufficient documentation

## 2012-05-21 DIAGNOSIS — L02419 Cutaneous abscess of limb, unspecified: Secondary | ICD-10-CM

## 2012-05-21 DIAGNOSIS — M79609 Pain in unspecified limb: Secondary | ICD-10-CM

## 2012-05-21 DIAGNOSIS — L03119 Cellulitis of unspecified part of limb: Secondary | ICD-10-CM

## 2012-05-21 DIAGNOSIS — L0390 Cellulitis, unspecified: Secondary | ICD-10-CM

## 2012-05-21 DIAGNOSIS — M79669 Pain in unspecified lower leg: Secondary | ICD-10-CM

## 2012-05-21 DIAGNOSIS — M7989 Other specified soft tissue disorders: Secondary | ICD-10-CM

## 2012-05-21 DIAGNOSIS — R52 Pain, unspecified: Secondary | ICD-10-CM

## 2012-05-21 DIAGNOSIS — E785 Hyperlipidemia, unspecified: Secondary | ICD-10-CM

## 2012-05-21 LAB — BASIC METABOLIC PANEL
CO2: 30 mEq/L (ref 19–32)
Calcium: 9.2 mg/dL (ref 8.4–10.5)
Creatinine, Ser: 0.7 mg/dL (ref 0.4–1.2)
GFR: 87.04 mL/min (ref >60.00)
Glucose, Bld: 88 mg/dL (ref 70–99)
Sodium: 142 mEq/L (ref 135–145)

## 2012-05-21 LAB — POCT URINALYSIS DIPSTICK
Bilirubin, UA: NEGATIVE
Blood, UA: NEGATIVE
Ketones, UA: NEGATIVE
pH, UA: 6

## 2012-05-21 LAB — LIPID PANEL
HDL: 46.6 mg/dL (ref >39.00)
Triglycerides: 94 mg/dL (ref 0.0–149.0)
VLDL: 18.8 mg/dL (ref 0.0–40.0)

## 2012-05-21 LAB — HEPATIC FUNCTION PANEL
Albumin: 3.8 g/dL (ref 3.5–5.2)
Bilirubin, Direct: 0.1 mg/dL (ref 0.0–0.3)
Total Protein: 7.2 g/dL (ref 6.0–8.3)

## 2012-05-21 MED ORDER — CEPHALEXIN 500 MG PO CAPS: 500.0000 mg | ORAL_CAPSULE | Freq: Four times a day (QID) | ORAL | Status: AC

## 2012-05-21 MED ORDER — METOLAZONE 5 MG PO TABS: ORAL_TABLET | ORAL | Status: DC

## 2012-05-21 NOTE — Assessment & Plan Note (Signed)
Keflex 500 mg bid

## 2012-05-21 NOTE — Assessment & Plan Note (Signed)
Check doppler low ext   R/o dvt

## 2012-05-21 NOTE — Addendum Note (Signed)
Addended by: Silvio Pate D on: 05/21/2012 01:45 PM   Modules accepted: Orders

## 2012-05-21 NOTE — Assessment & Plan Note (Signed)
con't lasix zaroxalyn for 2-3 days only Elevated legs

## 2012-05-21 NOTE — Progress Notes (Signed)
  Subjective:    Patient ID: Norma Ford, female    DOB: 09/05/1930, 78 y.o.   MRN: [id number]  HPI Pt here c/o reddness , swelling and pain in R low ext and calf x several days.  No inc sob.  No chest pain.     Review of Systems As above    Objective:   Physical Exam  Constitutional: She is oriented to person, place, and time. She appears well-developed and well-nourished.  Cardiovascular: Normal rate.   Pulmonary/Chest: Effort normal and breath sounds normal. No respiratory distress. She has no wheezes. She has no rales.  Musculoskeletal: She exhibits edema and tenderness.       + 2 pitting edema + calf pain  + errythema and hot to touch  Neurological: She is alert and oriented to person, place, and time.  Psychiatric: She has a normal mood and affect. Her behavior is normal. Judgment and thought content normal.          Assessment & Plan:

## 2012-05-21 NOTE — Patient Instructions (Signed)

## 2012-05-22 ENCOUNTER — Telehealth: Payer: Self-pay

## 2012-05-22 NOTE — Telephone Encounter (Signed)
Message copied by Arnette Norris on Tue May 22, 2012  8:41 AM ------      Message from: Lelon Perla      Created: Mon May 21, 2012  2:24 PM      Regarding: FW: prelim       No clot in leg      ----- Message -----         From: Richardean Canal         Sent: 05/21/2012   2:15 PM           To: Lelon Perla, DO      Subject: prelim                                                   Right leg appears negative for acute DVT.            Thank you

## 2012-05-22 NOTE — Telephone Encounter (Signed)
Labs are great! con't meds---recheck 6 months.- discussed with patient and she voiced understanding.     KP

## 2012-05-23 ENCOUNTER — Other Ambulatory Visit: Payer: Self-pay

## 2012-05-23 LAB — URINE CULTURE

## 2012-05-23 MED ORDER — POTASSIUM CHLORIDE CRYS ER 20 MEQ PO TBCR: 20.0000 meq | EXTENDED_RELEASE_TABLET | Freq: Two times a day (BID) | ORAL | Status: DC

## 2012-05-23 MED ORDER — METOPROLOL SUCCINATE ER 50 MG PO TB24: 50.0000 mg | ORAL_TABLET | Freq: Every day | ORAL | Status: DC

## 2012-05-23 MED ORDER — SIMVASTATIN 80 MG PO TABS: 80.0000 mg | ORAL_TABLET | Freq: Every day | ORAL | Status: DC

## 2012-05-23 MED ORDER — FUROSEMIDE 40 MG PO TABS: 40.0000 mg | ORAL_TABLET | Freq: Two times a day (BID) | ORAL | Status: DC

## 2012-06-05 ENCOUNTER — Other Ambulatory Visit: Payer: Self-pay | Admitting: *Deleted

## 2012-06-05 MED ORDER — HYDROCODONE-ACETAMINOPHEN 10-500 MG PO TABS: 1.0000 | ORAL_TABLET | Freq: Four times a day (QID) | ORAL | Status: DC | PRN

## 2012-06-05 NOTE — Telephone Encounter (Signed)
Refill x1 

## 2012-06-05 NOTE — Telephone Encounter (Signed)
Rx faxed to [facility]      KP

## 2012-06-05 NOTE — Telephone Encounter (Signed)
Last OV 05-21-12, last filled 05-14-12 #60, Pt states that she is leaving to go out of town on Friday and would like to know if she can have med fill sooner. .Please advise

## 2012-07-03 ENCOUNTER — Other Ambulatory Visit: Payer: Self-pay | Admitting: Family Medicine

## 2012-07-03 NOTE — Telephone Encounter (Signed)
Last seen 05/21/12 and filled 06/05/12 # 60. Please advise      KP

## 2012-07-30 ENCOUNTER — Telehealth: Payer: Self-pay | Admitting: Family Medicine

## 2012-07-30 MED ORDER — HYDROCODONE-ACETAMINOPHEN 10-500 MG PO TABS: 1.0000 | ORAL_TABLET | Freq: Four times a day (QID) | ORAL | Status: DC | PRN

## 2012-07-30 NOTE — Telephone Encounter (Signed)
Last seen 05/21/12 and filled 07/03/12 # 60. Please advise    KP  Patient complains that her bottom lip stays sore all of the time and she would like something for her mouth.     KP

## 2012-07-30 NOTE — Telephone Encounter (Signed)
LM ON TRIAGE LINE 858AM --WANTS something for her mouth and her pain meds [facility] cb# [phone] Pt stated she needs something for mouth, behind her bottom lip stays sore all the time And she also stated it is time for her pains meds to be renewed can send to [facility] which should be in her chart. Below was copied from her demographics [facility] [address] [place], [place] - [address] Cb# [phone]

## 2012-07-30 NOTE — Telephone Encounter (Signed)
She would need to be seen for her lip Ok to refill pain meds x1

## 2012-07-30 NOTE — Telephone Encounter (Signed)
Discussed with patient and she will call back tomorrow to schedule the apt because she will need to check with her husband first. Rx faxed      KP

## 2012-08-07 ENCOUNTER — Ambulatory Visit (INDEPENDENT_AMBULATORY_CARE_PROVIDER_SITE_OTHER): Payer: Medicare Other | Admitting: Family Medicine

## 2012-08-07 ENCOUNTER — Encounter: Payer: Self-pay | Admitting: Family Medicine

## 2012-08-07 VITALS — BP 138/80 | HR 61 | Temp 97.0°F | Wt 271.6 lb

## 2012-08-07 DIAGNOSIS — K120 Recurrent oral aphthae: Secondary | ICD-10-CM

## 2012-08-07 MED ORDER — FIRST-DUKES MOUTHWASH MT SUSP: OROMUCOSAL | Status: DC

## 2012-08-07 NOTE — Progress Notes (Signed)
  Subjective:    Patient ID: Norma Ford, female    DOB: 1930-04-25, 78 y.o.   MRN: [id number]  HPI Pt here c/o sores in her mouth.    Review of Systems    as above Objective:   Physical Exam  Constitutional: She is oriented to person, place, and time. She appears well-developed and well-nourished.  HENT:       + white patches + errythema  Neck: Normal range of motion. Neck supple.  Neurological: She is alert and oriented to person, place, and time.  Psychiatric: She has a normal mood and affect. Her behavior is normal. Judgment and thought content normal.          Assessment & Plan:  apthous ulcers---  Dukes magic mouthwash                               rto prn

## 2012-08-07 NOTE — Patient Instructions (Signed)
Use dukes mouthwash and call us if your symptoms do not improve.

## 2012-08-20 ENCOUNTER — Telehealth: Payer: Self-pay | Admitting: Family Medicine

## 2012-08-20 DIAGNOSIS — K120 Recurrent oral aphthae: Secondary | ICD-10-CM

## 2012-08-20 MED ORDER — FIRST-DUKES MOUTHWASH MT SUSP: OROMUCOSAL | Status: DC

## 2012-08-20 NOTE — Telephone Encounter (Signed)
Refill x1 

## 2012-08-20 NOTE — Telephone Encounter (Signed)
Refill done.  

## 2012-08-20 NOTE — Telephone Encounter (Signed)
Refill: Magic Mouth Wash 5 oz. Swish and spit out 1 teaspoonful by mouth four times a day if needed. Qty 150.00. Last fill 10.1.13

## 2012-08-20 NOTE — Telephone Encounter (Signed)
Ok to refill 

## 2012-08-24 ENCOUNTER — Other Ambulatory Visit: Payer: Self-pay

## 2012-08-24 MED ORDER — HYDROCODONE-ACETAMINOPHEN 10-500 MG PO TABS: 1.0000 | ORAL_TABLET | Freq: Four times a day (QID) | ORAL | Status: DC | PRN

## 2012-08-24 NOTE — Telephone Encounter (Signed)
Last filled 07/30/12 #60 no refills. OV 08/07/12.  PLz advise     MW

## 2012-08-24 NOTE — Telephone Encounter (Signed)
Called pt back to advise Rx ready for pick up from office. Pt stated will get it picked up Tuesday because that's when her husband will do it.  MW

## 2012-08-27 ENCOUNTER — Other Ambulatory Visit: Payer: Self-pay

## 2012-08-27 NOTE — Telephone Encounter (Signed)
Pt called concerning Rx hydrocodone. I advised pt I had put it upfront to be picked up. Pt requested to be faxed to pharmacy. I advised pt I would fax Rx. Rx sent.    MW

## 2012-09-17 ENCOUNTER — Telehealth: Payer: Self-pay | Admitting: Family Medicine

## 2012-09-17 NOTE — Telephone Encounter (Signed)
LM ON TRIAGE---1037am NEED TO SPEAK WITH CME/DR REGARDING PHARMACY CHANGES & OTHER MEDICATION ISSUES  Cb# [phone]

## 2012-09-18 MED ORDER — HYDROCODONE-ACETAMINOPHEN 10-500 MG PO TABS: 1.0000 | ORAL_TABLET | Freq: Four times a day (QID) | ORAL | Status: DC | PRN

## 2012-09-18 NOTE — Telephone Encounter (Signed)
Spoke with Duwayne Heck at [facility] and she said it was ok to fax in a 30 day supply of the hydrocodone according to the patient's health plan.     KP

## 2012-09-18 NOTE — Telephone Encounter (Signed)
Can we double check ins on this because that is a lot of pain med

## 2012-09-18 NOTE — Telephone Encounter (Signed)
Call from patient and she said [employer] will no longer filling her pain medication at  [facility], she said she needed it to go to [facility] instead.  Last seen 08/07/12 and filled 08/24/12 # 60. Please advise     KP

## 2012-09-27 ENCOUNTER — Other Ambulatory Visit: Payer: Self-pay

## 2012-09-27 NOTE — Telephone Encounter (Signed)
Pt LMOVM stating need pain med sent to [facility] will be out after today. I checked and Kim sent it 09/18/12. PLz advise pt  ZO:[phone] MW

## 2012-09-28 NOTE — Telephone Encounter (Signed)
Called pt and advised pt to call [facility]. Pt stated understanding.   MW

## 2012-09-28 NOTE — Telephone Encounter (Signed)
Pt states understanding.     MW

## 2012-09-28 NOTE — Telephone Encounter (Signed)
Called [facility] spoke to [place] concerning pt pain med Rx  Order # [id number]. Dois Davenport advises it can take 3-5 days from today. Called pt to advise

## 2012-09-28 NOTE — Telephone Encounter (Signed)
I sent it 09/18/12 at the patient's request. the patient need to contact [facility].    KP

## 2012-09-28 NOTE — Telephone Encounter (Signed)
Pt called back stating spoke to [facility] need [place] to call them.

## 2012-10-22 ENCOUNTER — Telehealth: Payer: Self-pay | Admitting: Family Medicine

## 2012-10-22 MED ORDER — HYDROCODONE-ACETAMINOPHEN 10-500 MG PO TABS: 1.0000 | ORAL_TABLET | Freq: Four times a day (QID) | ORAL | Status: DC | PRN

## 2012-10-22 NOTE — Telephone Encounter (Signed)
Patient aware and she will come in to pick up tomorrow.      KP

## 2012-10-22 NOTE — Telephone Encounter (Signed)
Refill but pt has to pick up

## 2012-10-22 NOTE — Telephone Encounter (Signed)
Refill: Hydroco/apap tab 10-500 mg tablet. Request sent from [facility]

## 2012-10-22 NOTE — Telephone Encounter (Signed)
Last seen 08/07/12 and filled 09/18/12 # 60. Please advise     KP

## 2012-11-26 ENCOUNTER — Telehealth: Payer: Self-pay | Admitting: Family Medicine

## 2012-11-26 NOTE — Telephone Encounter (Signed)
Pt called and stated someone called her on Friday- checked into it and it was a live person and not phone tree, not sure who called her.

## 2012-11-26 NOTE — Telephone Encounter (Signed)
Phone tree was confirming her apt.     KP

## 2012-11-29 ENCOUNTER — Other Ambulatory Visit: Payer: Self-pay | Admitting: Family Medicine

## 2012-11-29 ENCOUNTER — Telehealth: Payer: Self-pay | Admitting: Family Medicine

## 2012-11-29 DIAGNOSIS — G8929 Other chronic pain: Secondary | ICD-10-CM

## 2012-11-29 MED ORDER — HYDROCODONE-ACETAMINOPHEN 10-325 MG PO TABS: 1.0000 | ORAL_TABLET | Freq: Three times a day (TID) | ORAL | Status: DC | PRN

## 2012-11-29 NOTE — Telephone Encounter (Signed)
Last seen 08/07/12 and filled 10/22/12 # 60. Please advise on the new dose.      KP

## 2012-11-29 NOTE — Telephone Encounter (Signed)
Please advise on the new dose since the 10-500 is no longer avail      KP

## 2012-11-29 NOTE — Telephone Encounter (Signed)
Faxed to [facility]       KP

## 2012-11-29 NOTE — Telephone Encounter (Signed)
Patient calling requesting refill for hydrocodone. Patient states the med must be changed because they do not sell the 10-500 anymore. Pt uses [facility].

## 2012-11-29 NOTE — Telephone Encounter (Signed)
10/325 lortab

## 2012-11-29 NOTE — Telephone Encounter (Signed)
Ok to refill x 1  

## 2012-11-30 ENCOUNTER — Encounter: Payer: Medicare Other | Admitting: Family Medicine

## 2012-12-06 ENCOUNTER — Telehealth: Payer: Self-pay | Admitting: *Deleted

## 2012-12-06 NOTE — Telephone Encounter (Signed)
Pt states that dose on HYDROcodone-acetaminophen (NORCO) 10-325 MG was decrease and is afraid that it may not be strong enough as the previous dose. Pt would like to know if there is something else stronger that she can take. Please advise

## 2012-12-06 NOTE — Telephone Encounter (Signed)
Discussed with patient and she voiced understanding. She said she now has to use [facility] on [address] as the local pharmacy.       KP

## 2012-12-06 NOTE — Telephone Encounter (Signed)
They tylenol dose was decreased----for everyone The narcotic is still the same

## 2012-12-10 ENCOUNTER — Other Ambulatory Visit: Payer: Self-pay | Admitting: Family Medicine

## 2012-12-10 MED ORDER — METOPROLOL SUCCINATE ER 50 MG PO TB24: 50.0000 mg | ORAL_TABLET | Freq: Every day | ORAL | Status: DC

## 2012-12-10 MED ORDER — FUROSEMIDE 40 MG PO TABS: 40.0000 mg | ORAL_TABLET | Freq: Two times a day (BID) | ORAL | Status: DC

## 2012-12-10 NOTE — Telephone Encounter (Signed)
Refills x 2   1-refill  Furosemide (Tab) 40 MG Take 1 tablet (40 mg total) by mouth 2 (two) times daily. #90 days no last fill last wrt 7.17.13  2-Metoprolol Succ ER Tab 50MG  take 1 tablet by mouth daily #90 last wrt 7.17.13

## 2012-12-24 ENCOUNTER — Encounter: Payer: Self-pay | Admitting: Family Medicine

## 2012-12-25 ENCOUNTER — Telehealth: Payer: Self-pay | Admitting: Family Medicine

## 2012-12-25 MED ORDER — POTASSIUM CHLORIDE CRYS ER 20 MEQ PO TBCR: 20.0000 meq | EXTENDED_RELEASE_TABLET | Freq: Two times a day (BID) | ORAL | Status: DC

## 2012-12-25 MED ORDER — SIMVASTATIN 80 MG PO TABS: 80.0000 mg | ORAL_TABLET | Freq: Every day | ORAL | Status: DC

## 2012-12-25 NOTE — Telephone Encounter (Signed)
Refill:Simvastatin tab 80mg . Take 1 by mouth at bedtime. 90 day supply  Klor-con m20 er tab.Take 1 by mouth twice daily. 90 day supply

## 2013-01-03 ENCOUNTER — Telehealth: Payer: Self-pay | Admitting: *Deleted

## 2013-01-03 DIAGNOSIS — G8929 Other chronic pain: Secondary | ICD-10-CM

## 2013-01-03 MED ORDER — POLYETHYLENE GLYCOL 3350 17 GM/SCOOP PO POWD: 17.0000 g | Freq: Two times a day (BID) | ORAL | Status: DC | PRN

## 2013-01-03 MED ORDER — HYDROCODONE-ACETAMINOPHEN 10-325 MG PO TABS: 1.0000 | ORAL_TABLET | Freq: Three times a day (TID) | ORAL | Status: DC | PRN

## 2013-01-03 NOTE — Telephone Encounter (Signed)
miralax is otc Ok to refill pain med

## 2013-01-03 NOTE — Telephone Encounter (Signed)
Last OV 08-07-12, last filled 11-29-12 #60. Pt is also requesting a Rx for generic miralax due to Pain meds causing constipation.Please advise

## 2013-01-03 NOTE — Telephone Encounter (Signed)
Dicussed with patient and she stated her sister law gets the Miralax by mail order. Per Dr.Lowne Ok to send. Both med's have been faxed     KP

## 2013-01-15 ENCOUNTER — Telehealth: Payer: Self-pay | Admitting: Family Medicine

## 2013-01-15 NOTE — Telephone Encounter (Signed)
refill Hydroco/APAP tab no qty, instructions or last fill date but it was last wrt 2.27.14--Take 1 tablet by mouth every 8 (eight) hours as needed for pain #60

## 2013-01-15 NOTE — Telephone Encounter (Signed)
Rx re-faxed to [facility].     KP

## 2013-01-16 ENCOUNTER — Other Ambulatory Visit: Payer: Self-pay

## 2013-01-16 DIAGNOSIS — G8929 Other chronic pain: Secondary | ICD-10-CM

## 2013-01-16 MED ORDER — HYDROCODONE-ACETAMINOPHEN 10-325 MG PO TABS: 1.0000 | ORAL_TABLET | Freq: Three times a day (TID) | ORAL | Status: DC | PRN

## 2013-01-16 NOTE — Telephone Encounter (Signed)
Call from [facility] and they stated the patient is only receiving a 20 day supply and needs to get a 60 or 90 day supply in order to get the medication for free. Per Dr.Lowne ok to give an Rx for #180. RX faxed to [facility]    KP

## 2013-02-04 ENCOUNTER — Ambulatory Visit (INDEPENDENT_AMBULATORY_CARE_PROVIDER_SITE_OTHER): Payer: Medicare Other | Admitting: Family Medicine

## 2013-02-04 ENCOUNTER — Encounter: Payer: Self-pay | Admitting: Family Medicine

## 2013-02-04 VITALS — BP 138/80 | HR 68 | Temp 98.5°F | Ht 61.5 in | Wt 272.4 lb

## 2013-02-04 DIAGNOSIS — Z0000 Encounter for general adult medical examination without abnormal findings: Secondary | ICD-10-CM

## 2013-02-04 DIAGNOSIS — C8408 Mycosis fungoides, lymph nodes of multiple sites: Secondary | ICD-10-CM

## 2013-02-04 DIAGNOSIS — I10 Essential (primary) hypertension: Secondary | ICD-10-CM

## 2013-02-04 DIAGNOSIS — E785 Hyperlipidemia, unspecified: Secondary | ICD-10-CM

## 2013-02-04 DIAGNOSIS — G8929 Other chronic pain: Secondary | ICD-10-CM

## 2013-02-04 LAB — POCT URINALYSIS DIPSTICK
Leukocytes, UA: NEGATIVE
Nitrite, UA: NEGATIVE
Urobilinogen, UA: 0.2
pH, UA: 6

## 2013-02-04 LAB — CBC WITH DIFFERENTIAL/PLATELET
Basophils Absolute: 0.2 10*3/uL — ABNORMAL HIGH (ref 0.0–0.1)
Eosinophils Absolute: 0.2 10*3/uL (ref 0.0–0.7)
Hemoglobin: 14 g/dL (ref 12.0–15.0)
Lymphocytes Relative: 23.9 % (ref 12.0–46.0)
MCHC: 33.2 g/dL (ref 30.0–36.0)
Monocytes Relative: 10.9 % (ref 3.0–12.0)
Neutro Abs: 3.8 10*3/uL (ref 1.4–7.7)
Neutrophils Relative %: 58.9 % (ref 43.0–77.0)
RBC: 4.34 Mil/uL (ref 3.87–5.11)
RDW: 13.2 % (ref 11.5–14.6)

## 2013-02-04 LAB — HEPATIC FUNCTION PANEL
AST: 18 U/L (ref 0–37)
Alkaline Phosphatase: 54 U/L (ref 39–117)
Bilirubin, Direct: 0.1 mg/dL (ref 0.0–0.3)

## 2013-02-04 LAB — BASIC METABOLIC PANEL
CO2: 28 mEq/L (ref 19–32)
Calcium: 9 mg/dL (ref 8.4–10.5)
Creatinine, Ser: 0.8 mg/dL (ref 0.4–1.2)
GFR: 77.72 mL/min (ref >60.00)
Glucose, Bld: 75 mg/dL (ref 70–99)
Sodium: 137 mEq/L (ref 135–145)

## 2013-02-04 LAB — LIPID PANEL
HDL: 45.4 mg/dL (ref >39.00)
LDL Cholesterol: 57 mg/dL (ref 0–99)
Total CHOL/HDL Ratio: 3
VLDL: 15.2 mg/dL (ref 0.0–40.0)

## 2013-02-04 MED ORDER — HYDROCODONE-ACETAMINOPHEN 10-325 MG PO TABS: 1.0000 | ORAL_TABLET | Freq: Four times a day (QID) | ORAL | Status: DC | PRN

## 2013-02-04 NOTE — Assessment & Plan Note (Signed)
stable °

## 2013-02-04 NOTE — Patient Instructions (Addendum)
Preventive Care for Adults, Female A healthy lifestyle and preventive care can promote health and wellness. Preventive health guidelines for women include the following key practices.  A routine yearly physical is a good way to check with your caregiver about your health and preventive screening. It is a chance to share any concerns and updates on your health, and to receive a thorough exam.  Visit your dentist for a routine exam and preventive care every 6 months. Brush your teeth twice a day and floss once a day. Good oral hygiene prevents tooth decay and gum disease.  The frequency of eye exams is based on your age, health, family medical history, use of contact lenses, and other factors. Follow your caregiver's recommendations for frequency of eye exams.  Eat a healthy diet. Foods like vegetables, fruits, whole grains, low-fat dairy products, and lean protein foods contain the nutrients you need without too many calories. Decrease your intake of foods high in solid fats, added sugars, and salt. Eat the right amount of calories for you.Get information about a proper diet from your caregiver, if necessary.  Regular physical exercise is one of the most important things you can do for your health. Most adults should get at least 150 minutes of moderate-intensity exercise (any activity that increases your heart rate and causes you to sweat) each week. In addition, most adults need muscle-strengthening exercises on 2 or more days a week.  Maintain a healthy weight. The body mass index (BMI) is a screening tool to identify possible weight problems. It provides an estimate of body fat based on height and weight. Your caregiver can help determine your BMI, and can help you achieve or maintain a healthy weight.For adults 20 years and older:  A BMI below 18.5 is considered underweight.  A BMI of 18.5 to 24.9 is normal.  A BMI of 25 to 29.9 is considered overweight.  A BMI of 30 and above is  considered obese.  Maintain normal blood lipids and cholesterol levels by exercising and minimizing your intake of saturated fat. Eat a balanced diet with plenty of fruit and vegetables. Blood tests for lipids and cholesterol should begin at age 20 and be repeated every 5 years. If your lipid or cholesterol levels are high, you are over 50, or you are at high risk for heart disease, you may need your cholesterol levels checked more frequently.Ongoing high lipid and cholesterol levels should be treated with medicines if diet and exercise are not effective.  If you smoke, find out from your caregiver how to quit. If you do not use tobacco, do not start.  If you are pregnant, do not drink alcohol. If you are breastfeeding, be very cautious about drinking alcohol. If you are not pregnant and choose to drink alcohol, do not exceed 1 drink per day. One drink is considered to be 12 ounces (355 mL) of beer, 5 ounces (148 mL) of wine, or 1.5 ounces (44 mL) of liquor.  Avoid use of street drugs. Do not share needles with anyone. Ask for help if you need support or instructions about stopping the use of drugs.  High blood pressure causes heart disease and increases the risk of stroke. Your blood pressure should be checked at least every 1 to 2 years. Ongoing high blood pressure should be treated with medicines if weight loss and exercise are not effective.  If you are 55 to 77 years old, ask your caregiver if you should take aspirin to prevent strokes.  Diabetes   screening involves taking a blood sample to check your fasting blood sugar level. This should be done once every 3 years, after age 45, if you are within normal weight and without risk factors for diabetes. Testing should be considered at a younger age or be carried out more frequently if you are overweight and have at least 1 risk factor for diabetes.  Breast cancer screening is essential preventive care for women. You should practice "breast  self-awareness." This means understanding the normal appearance and feel of your breasts and may include breast self-examination. Any changes detected, no matter how small, should be reported to a caregiver. Women in their 20s and 30s should have a clinical breast exam (CBE) by a caregiver as part of a regular health exam every 1 to 3 years. After age 40, women should have a CBE every year. Starting at age 40, women should consider having a mammography (breast X-ray test) every year. Women who have a family history of breast cancer should talk to their caregiver about genetic screening. Women at a high risk of breast cancer should talk to their caregivers about having magnetic resonance imaging (MRI) and a mammography every year.  The Pap test is a screening test for cervical cancer. A Pap test can show cell changes on the cervix that might become cervical cancer if left untreated. A Pap test is a procedure in which cells are obtained and examined from the lower end of the uterus (cervix).  Women should have a Pap test starting at age 21.  Between ages 21 and 29, Pap tests should be repeated every 2 years.  Beginning at age 30, you should have a Pap test every 3 years as long as the past 3 Pap tests have been normal.  Some women have medical problems that increase the chance of getting cervical cancer. Talk to your caregiver about these problems. It is especially important to talk to your caregiver if a new problem develops soon after your last Pap test. In these cases, your caregiver may recommend more frequent screening and Pap tests.  The above recommendations are the same for women who have or have not gotten the vaccine for human papillomavirus (HPV).  If you had a hysterectomy for a problem that was not cancer or a condition that could lead to cancer, then you no longer need Pap tests. Even if you no longer need a Pap test, a regular exam is a good idea to make sure no other problems are  starting.  If you are between ages 65 and 70, and you have had normal Pap tests going back 10 years, you no longer need Pap tests. Even if you no longer need a Pap test, a regular exam is a good idea to make sure no other problems are starting.  If you have had past treatment for cervical cancer or a condition that could lead to cancer, you need Pap tests and screening for cancer for at least 20 years after your treatment.  If Pap tests have been discontinued, risk factors (such as a new sexual partner) need to be reassessed to determine if screening should be resumed.  The HPV test is an additional test that may be used for cervical cancer screening. The HPV test looks for the virus that can cause the cell changes on the cervix. The cells collected during the Pap test can be tested for HPV. The HPV test could be used to screen women aged 30 years and older, and should   be used in women of any age who have unclear Pap test results. After the age of 30, women should have HPV testing at the same frequency as a Pap test.  Colorectal cancer can be detected and often prevented. Most routine colorectal cancer screening begins at the age of 50 and continues through age 75. However, your caregiver may recommend screening at an earlier age if you have risk factors for colon cancer. On a yearly basis, your caregiver may provide home test kits to check for hidden blood in the stool. Use of a small camera at the end of a tube, to directly examine the colon (sigmoidoscopy or colonoscopy), can detect the earliest forms of colorectal cancer. Talk to your caregiver about this at age 50, when routine screening begins. Direct examination of the colon should be repeated every 5 to 10 years through age 75, unless early forms of pre-cancerous polyps or small growths are found.  Hepatitis C blood testing is recommended for all people born from 1945 through 1965 and any individual with known risks for hepatitis C.  Practice  safe sex. Use condoms and avoid high-risk sexual practices to reduce the spread of sexually transmitted infections (STIs). STIs include gonorrhea, chlamydia, syphilis, trichomonas, herpes, HPV, and human immunodeficiency virus (HIV). Herpes, HIV, and HPV are viral illnesses that have no cure. They can result in disability, cancer, and death. Sexually active women aged 25 and younger should be checked for chlamydia. Older women with new or multiple partners should also be tested for chlamydia. Testing for other STIs is recommended if you are sexually active and at increased risk.  Osteoporosis is a disease in which the bones lose minerals and strength with aging. This can result in serious bone fractures. The risk of osteoporosis can be identified using a bone density scan. Women ages 65 and over and women at risk for fractures or osteoporosis should discuss screening with their caregivers. Ask your caregiver whether you should take a calcium supplement or vitamin D to reduce the rate of osteoporosis.  Menopause can be associated with physical symptoms and risks. Hormone replacement therapy is available to decrease symptoms and risks. You should talk to your caregiver about whether hormone replacement therapy is right for you.  Use sunscreen with sun protection factor (SPF) of 30 or more. Apply sunscreen liberally and repeatedly throughout the day. You should seek shade when your shadow is shorter than you. Protect yourself by wearing long sleeves, pants, a wide-brimmed hat, and sunglasses year round, whenever you are outdoors.  Once a month, do a whole body skin exam, using a mirror to look at the skin on your back. Notify your caregiver of new moles, moles that have irregular borders, moles that are larger than a pencil eraser, or moles that have changed in shape or color.  Stay current with required immunizations.  Influenza. You need a dose every fall (or winter). The composition of the flu vaccine  changes each year, so being vaccinated once is not enough.  Pneumococcal polysaccharide. You need 1 to 2 doses if you smoke cigarettes or if you have certain chronic medical conditions. You need 1 dose at age 65 (or older) if you have never been vaccinated.  Tetanus, diphtheria, pertussis (Tdap, Td). Get 1 dose of Tdap vaccine if you are younger than age 65, are over 65 and have contact with an infant, are a healthcare worker, are pregnant, or simply want to be protected from whooping cough. After that, you need a Td   booster dose every 10 years. Consult your caregiver if you have not had at least 3 tetanus and diphtheria-containing shots sometime in your life or have a deep or dirty wound.  HPV. You need this vaccine if you are a woman age 26 or younger. The vaccine is given in 3 doses over 6 months.  Measles, mumps, rubella (MMR). You need at least 1 dose of MMR if you were born in 1957 or later. You may also need a second dose.  Meningococcal. If you are age 19 to 21 and a first-year college student living in a residence hall, or have one of several medical conditions, you need to get vaccinated against meningococcal disease. You may also need additional booster doses.  Zoster (shingles). If you are age 60 or older, you should get this vaccine.  Varicella (chickenpox). If you have never had chickenpox or you were vaccinated but received only 1 dose, talk to your caregiver to find out if you need this vaccine.  Hepatitis A. You need this vaccine if you have a specific risk factor for hepatitis A virus infection or you simply wish to be protected from this disease. The vaccine is usually given as 2 doses, 6 to 18 months apart.  Hepatitis B. You need this vaccine if you have a specific risk factor for hepatitis B virus infection or you simply wish to be protected from this disease. The vaccine is given in 3 doses, usually over 6 months. Preventive Services / Frequency Ages 19 to 39  Blood  pressure check.** / Every 1 to 2 years.  Lipid and cholesterol check.** / Every 5 years beginning at age 20.  Clinical breast exam.** / Every 3 years for women in their 20s and 30s.  Pap test.** / Every 2 years from ages 21 through 29. Every 3 years starting at age 30 through age 65 or 70 with a history of 3 consecutive normal Pap tests.  HPV screening.** / Every 3 years from ages 30 through ages 65 to 70 with a history of 3 consecutive normal Pap tests.  Hepatitis C blood test.** / For any individual with known risks for hepatitis C.  Skin self-exam. / Monthly.  Influenza immunization.** / Every year.  Pneumococcal polysaccharide immunization.** / 1 to 2 doses if you smoke cigarettes or if you have certain chronic medical conditions.  Tetanus, diphtheria, pertussis (Tdap, Td) immunization. / A one-time dose of Tdap vaccine. After that, you need a Td booster dose every 10 years.  HPV immunization. / 3 doses over 6 months, if you are 26 and younger.  Measles, mumps, rubella (MMR) immunization. / You need at least 1 dose of MMR if you were born in 1957 or later. You may also need a second dose.  Meningococcal immunization. / 1 dose if you are age 19 to 21 and a first-year college student living in a residence hall, or have one of several medical conditions, you need to get vaccinated against meningococcal disease. You may also need additional booster doses.  Varicella immunization.** / Consult your caregiver.  Hepatitis A immunization.** / Consult your caregiver. 2 doses, 6 to 18 months apart.  Hepatitis B immunization.** / Consult your caregiver. 3 doses usually over 6 months. Ages 40 to 64  Blood pressure check.** / Every 1 to 2 years.  Lipid and cholesterol check.** / Every 5 years beginning at age 20.  Clinical breast exam.** / Every year after age 40.  Mammogram.** / Every year beginning at age 40   and continuing for as long as you are in good health. Consult with your  caregiver.  Pap test.** / Every 3 years starting at age 30 through age 65 or 70 with a history of 3 consecutive normal Pap tests.  HPV screening.** / Every 3 years from ages 30 through ages 65 to 70 with a history of 3 consecutive normal Pap tests.  Fecal occult blood test (FOBT) of stool. / Every year beginning at age 50 and continuing until age 75. You may not need to do this test if you get a colonoscopy every 10 years.  Flexible sigmoidoscopy or colonoscopy.** / Every 5 years for a flexible sigmoidoscopy or every 10 years for a colonoscopy beginning at age 50 and continuing until age 75.  Hepatitis C blood test.** / For all people born from 1945 through 1965 and any individual with known risks for hepatitis C.  Skin self-exam. / Monthly.  Influenza immunization.** / Every year.  Pneumococcal polysaccharide immunization.** / 1 to 2 doses if you smoke cigarettes or if you have certain chronic medical conditions.  Tetanus, diphtheria, pertussis (Tdap, Td) immunization.** / A one-time dose of Tdap vaccine. After that, you need a Td booster dose every 10 years.  Measles, mumps, rubella (MMR) immunization. / You need at least 1 dose of MMR if you were born in 1957 or later. You may also need a second dose.  Varicella immunization.** / Consult your caregiver.  Meningococcal immunization.** / Consult your caregiver.  Hepatitis A immunization.** / Consult your caregiver. 2 doses, 6 to 18 months apart.  Hepatitis B immunization.** / Consult your caregiver. 3 doses, usually over 6 months. Ages 65 and over  Blood pressure check.** / Every 1 to 2 years.  Lipid and cholesterol check.** / Every 5 years beginning at age 20.  Clinical breast exam.** / Every year after age 40.  Mammogram.** / Every year beginning at age 40 and continuing for as long as you are in good health. Consult with your caregiver.  Pap test.** / Every 3 years starting at age 30 through age 65 or 70 with a 3  consecutive normal Pap tests. Testing can be stopped between 65 and 70 with 3 consecutive normal Pap tests and no abnormal Pap or HPV tests in the past 10 years.  HPV screening.** / Every 3 years from ages 30 through ages 65 or 70 with a history of 3 consecutive normal Pap tests. Testing can be stopped between 65 and 70 with 3 consecutive normal Pap tests and no abnormal Pap or HPV tests in the past 10 years.  Fecal occult blood test (FOBT) of stool. / Every year beginning at age 50 and continuing until age 75. You may not need to do this test if you get a colonoscopy every 10 years.  Flexible sigmoidoscopy or colonoscopy.** / Every 5 years for a flexible sigmoidoscopy or every 10 years for a colonoscopy beginning at age 50 and continuing until age 75.  Hepatitis C blood test.** / For all people born from 1945 through 1965 and any individual with known risks for hepatitis C.  Osteoporosis screening.** / A one-time screening for women ages 65 and over and women at risk for fractures or osteoporosis.  Skin self-exam. / Monthly.  Influenza immunization.** / Every year.  Pneumococcal polysaccharide immunization.** / 1 dose at age 65 (or older) if you have never been vaccinated.  Tetanus, diphtheria, pertussis (Tdap, Td) immunization. / A one-time dose of Tdap vaccine if you are over   65 and have contact with an infant, are a healthcare worker, or simply want to be protected from whooping cough. After that, you need a Td booster dose every 10 years.  Varicella immunization.** / Consult your caregiver.  Meningococcal immunization.** / Consult your caregiver.  Hepatitis A immunization.** / Consult your caregiver. 2 doses, 6 to 18 months apart.  Hepatitis B immunization.** / Check with your caregiver. 3 doses, usually over 6 months. ** Family history and personal history of risk and conditions may change your caregiver's recommendations. Document Released: 12/20/2001 Document Revised: 01/16/2012  Document Reviewed: 03/21/2011 [employer] Patient Information 2013 [employer]  

## 2013-02-04 NOTE — Assessment & Plan Note (Signed)
Pt is seeing derm
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
## 2013-02-04 NOTE — Assessment & Plan Note (Signed)
Check labs con't meds 

## 2013-03-29 ENCOUNTER — Telehealth: Payer: Self-pay | Admitting: General Practice

## 2013-03-29 DIAGNOSIS — G8929 Other chronic pain: Secondary | ICD-10-CM

## 2013-03-29 MED ORDER — HYDROCODONE-ACETAMINOPHEN 10-325 MG PO TABS: 1.0000 | ORAL_TABLET | Freq: Four times a day (QID) | ORAL | Status: DC | PRN

## 2013-03-29 NOTE — Telephone Encounter (Signed)
Refill x1 

## 2013-03-29 NOTE — Telephone Encounter (Signed)
Hydrocodone refill request.  Pt last seen on 02-04-13  Med filled same day  #180 0 refill  [facility] pt.

## 2013-03-29 NOTE — Telephone Encounter (Signed)
Med filled, pt notified.  

## 2013-04-04 ENCOUNTER — Other Ambulatory Visit: Payer: Self-pay | Admitting: General Practice

## 2013-04-04 MED ORDER — FUROSEMIDE 40 MG PO TABS: 40.0000 mg | ORAL_TABLET | Freq: Two times a day (BID) | ORAL | Status: DC

## 2013-04-04 NOTE — Telephone Encounter (Signed)
Med filled.  

## 2013-06-04 ENCOUNTER — Telehealth: Payer: Self-pay | Admitting: *Deleted

## 2013-06-04 ENCOUNTER — Telehealth: Payer: Self-pay | Admitting: Family Medicine

## 2013-06-04 DIAGNOSIS — G8929 Other chronic pain: Secondary | ICD-10-CM

## 2013-06-04 MED ORDER — HYDROCODONE-ACETAMINOPHEN 10-325 MG PO TABS: 1.0000 | ORAL_TABLET | Freq: Four times a day (QID) | ORAL | Status: DC | PRN

## 2013-06-04 NOTE — Telephone Encounter (Signed)
Patient left a message requesting a refill for Hydocodone Last ov 02/04/13 Last date filled 03/29/13 180 ct. This patient has a controlled substance contract and she is a Dr. Laury Axon patient. Please Advise.

## 2013-06-04 NOTE — Telephone Encounter (Signed)
Patient asks that we send a refill for her Hydrocodone script to her [facility] mail order pharmacy.

## 2013-06-04 NOTE — Telephone Encounter (Signed)
Can renew for 1 month, # 90

## 2013-06-04 NOTE — Telephone Encounter (Signed)
Spoke with Dr Alwyn Ren and the one month supply is 180 instead of 90. Fixed and returned for signature.

## 2013-06-21 ENCOUNTER — Telehealth: Payer: Self-pay | Admitting: Family Medicine

## 2013-06-21 ENCOUNTER — Other Ambulatory Visit: Payer: Self-pay

## 2013-06-21 MED ORDER — FUROSEMIDE 40 MG PO TABS: 40.0000 mg | ORAL_TABLET | Freq: Two times a day (BID) | ORAL | Status: DC

## 2013-06-21 MED ORDER — METOPROLOL SUCCINATE ER 50 MG PO TB24: 50.0000 mg | ORAL_TABLET | Freq: Every day | ORAL | Status: DC

## 2013-06-21 NOTE — Telephone Encounter (Signed)
Sent new Rx to the pharmacy. Patient advised.

## 2013-06-21 NOTE — Telephone Encounter (Signed)
Rx has been filled for furosemide for 90 day supply.  Ag cma

## 2013-06-21 NOTE — Telephone Encounter (Signed)
Patient needs new rx for metoprolol to go to [facility] on [address]. This is a new pharmacy for the patient. Patient would like all other rx sent to [facility].

## 2013-06-27 ENCOUNTER — Other Ambulatory Visit: Payer: Self-pay | Admitting: *Deleted

## 2013-06-27 MED ORDER — METOPROLOL SUCCINATE ER 50 MG PO TB24: 50.0000 mg | ORAL_TABLET | Freq: Every day | ORAL | Status: DC

## 2013-06-27 NOTE — Telephone Encounter (Signed)
Rx has been sent to [facility] instead of [facility].  Ag cma

## 2013-06-28 NOTE — Telephone Encounter (Signed)
Patient returned call to [facility]

## 2013-07-23 ENCOUNTER — Other Ambulatory Visit: Payer: Self-pay | Admitting: *Deleted

## 2013-07-23 MED ORDER — SIMVASTATIN 80 MG PO TABS: 80.0000 mg | ORAL_TABLET | Freq: Every day | ORAL | Status: DC

## 2013-07-23 MED ORDER — POTASSIUM CHLORIDE CRYS ER 20 MEQ PO TBCR: 20.0000 meq | EXTENDED_RELEASE_TABLET | Freq: Two times a day (BID) | ORAL | Status: DC

## 2013-07-23 NOTE — Telephone Encounter (Signed)
Rx's refilled for klor-con and simvastatin.  Ag cma

## 2013-08-30 ENCOUNTER — Telehealth: Payer: Self-pay | Admitting: Family Medicine

## 2013-08-30 DIAGNOSIS — G8929 Other chronic pain: Secondary | ICD-10-CM

## 2013-08-30 MED ORDER — HYDROCODONE-ACETAMINOPHEN 10-325 MG PO TABS: 1.0000 | ORAL_TABLET | Freq: Four times a day (QID) | ORAL | Status: DC | PRN

## 2013-08-30 NOTE — Telephone Encounter (Signed)
Patient is calling to request a refill on her HYDROcodone-acetaminophen (NORCO) 10-325 MG. She has her rx's sent to [facility] and will be completely out next week. Patient says she left a VM on triage line a few days ago. Please advise.

## 2013-08-30 NOTE — Telephone Encounter (Signed)
Patient and husband made aware the Rx is ready for pick up.     KP

## 2013-08-30 NOTE — Telephone Encounter (Signed)
Refill x1-- pt overdue ov

## 2013-08-30 NOTE — Telephone Encounter (Signed)
Patient left message on triage line this morning. Patient was last seen on 01/17/2013. Medication last filled on 06/04/2013 #180, 0 refills. UDS was done on 10/23/2013-low risk, contract on file. Please advise. CMA

## 2013-09-03 ENCOUNTER — Telehealth: Payer: Self-pay | Admitting: Family Medicine

## 2013-09-03 MED ORDER — TRAMADOL HCL 50 MG PO TABS: 50.0000 mg | ORAL_TABLET | Freq: Four times a day (QID) | ORAL | Status: DC | PRN

## 2013-09-03 NOTE — Telephone Encounter (Signed)
09/03/2013  Pt husband came in this morning to pick up the prescription, also stating the are wanting this medication to be changed to something that could be called in to the pharmacy so they do not have to come in to the office each month to pick up the paper prescription.  The number he gave for [place] to call them back is [phone].  bw

## 2013-09-03 NOTE — Telephone Encounter (Signed)
The only thing would be ultram 50 mg 1 po q6h prn #60

## 2013-09-03 NOTE — Telephone Encounter (Signed)
Patient is calling to inquire about having her Hydrocodone rx changed to another medication that is "not such a hassle" to get. She says it is hard for her and her husband to get around and such an inconvenience for them to have to call every 30 days for a refill which they have to come out to the office and pick up. Please advise.

## 2013-09-03 NOTE — Telephone Encounter (Signed)
Please advise      KP 

## 2013-09-03 NOTE — Telephone Encounter (Signed)
Spoke with patient and she voiced understanding. Rx updated on medication list.      KP

## 2013-09-09 ENCOUNTER — Telehealth: Payer: Self-pay | Admitting: Family Medicine

## 2013-09-09 MED ORDER — FUROSEMIDE 40 MG PO TABS: 40.0000 mg | ORAL_TABLET | Freq: Two times a day (BID) | ORAL | Status: DC

## 2013-09-09 MED ORDER — METOPROLOL SUCCINATE ER 50 MG PO TB24: 50.0000 mg | ORAL_TABLET | Freq: Every day | ORAL | Status: DC

## 2013-09-09 NOTE — Telephone Encounter (Signed)
Patient called to refill furosemide (LASIX) 40 MG tablet and her metoprolol succinate (TOPROL-XL) 50 MG 24 hr tablet   Pharmacy [facility] [address]

## 2013-11-28 ENCOUNTER — Telehealth: Payer: Self-pay

## 2013-11-28 NOTE — Telephone Encounter (Signed)
Patient will wait for Dr.Lowne since her husband will go to pick up med's on Tuesday of next week.     KP

## 2013-11-28 NOTE — Telephone Encounter (Signed)
Rosalita Chessman, DO at 09/03/2013 9:23 AM     Status: Signed        The only thing would be ultram 50 mg 1 po q6h prn #60      Call from the patient and she stated she was ready for her refill on her Tramadol. This would be her First Rx since she had plenty of Hydrocodone left when she asked to switch in Oct. Please advise if it is ok to fill Rx.      KP

## 2013-11-28 NOTE — Telephone Encounter (Signed)
#  30 Tramadol

## 2013-11-29 NOTE — Telephone Encounter (Signed)
Ok to change quanity to #180

## 2013-12-02 MED ORDER — TRAMADOL HCL 50 MG PO TABS: 50.0000 mg | ORAL_TABLET | Freq: Four times a day (QID) | ORAL | Status: DC | PRN

## 2013-12-02 NOTE — Addendum Note (Signed)
Addended by: Ewing Schlein on: 12/02/2013 08:04 AM   Modules accepted: Orders

## 2013-12-02 NOTE — Telephone Encounter (Signed)
Rx faxed to [facility].     KP

## 2013-12-05 ENCOUNTER — Other Ambulatory Visit: Payer: Self-pay

## 2013-12-05 MED ORDER — SIMVASTATIN 80 MG PO TABS: 80.0000 mg | ORAL_TABLET | Freq: Every day | ORAL | Status: DC

## 2013-12-05 NOTE — Telephone Encounter (Signed)
Simvastatin 80 mg PO at Bedtime.  Reordered 90 tablets, no refills.   Last OV: 02/04/13  OV DUE.  Pt. Made aware.

## 2013-12-06 ENCOUNTER — Other Ambulatory Visit: Payer: Self-pay | Admitting: *Deleted

## 2013-12-25 ENCOUNTER — Other Ambulatory Visit: Payer: Self-pay | Admitting: Family Medicine

## 2014-01-21 ENCOUNTER — Telehealth: Payer: Self-pay | Admitting: *Deleted

## 2014-01-21 NOTE — Telephone Encounter (Signed)
Spoke with patient, made appt for tomorrow w/ Dr.Lowne at 2:15 for evaluation of shoulder pain.

## 2014-01-21 NOTE — Telephone Encounter (Signed)
Pt needs ov. 

## 2014-01-21 NOTE — Telephone Encounter (Signed)
Patient called and stated that her pain medication, Tramadol, is not working well. She states that it doesn't help with her pain at all. Please advise. JG//CMA

## 2014-01-23 ENCOUNTER — Ambulatory Visit (INDEPENDENT_AMBULATORY_CARE_PROVIDER_SITE_OTHER): Payer: Medicare HMO | Admitting: Family Medicine

## 2014-01-23 ENCOUNTER — Encounter: Payer: Self-pay | Admitting: Family Medicine

## 2014-01-23 VITALS — BP 128/80 | HR 78 | Temp 98.5°F | Wt 275.6 lb

## 2014-01-23 DIAGNOSIS — M25519 Pain in unspecified shoulder: Secondary | ICD-10-CM

## 2014-01-23 MED ORDER — HYDROCODONE-ACETAMINOPHEN 5-325 MG PO TABS: 1.0000 | ORAL_TABLET | Freq: Four times a day (QID) | ORAL | Status: DC | PRN

## 2014-01-23 NOTE — Progress Notes (Signed)
Pre visit review using our clinic review tool, if applicable. No additional management support is needed unless otherwise documented below in the visit note. 

## 2014-01-23 NOTE — Patient Instructions (Signed)
   Shoulder Pain The shoulder is the joint that connects your arms to your body. The bones that form the shoulder joint include the upper arm bone (humerus), the shoulder blade (scapula), and the collarbone (clavicle). The top of the humerus is shaped like a ball and fits into a rather flat socket on the scapula (glenoid cavity). A combination of muscles and strong, fibrous tissues that connect muscles to bones (tendons) support your shoulder joint and hold the ball in the socket. Small, fluid-filled sacs (bursae) are located in different areas of the joint. They act as cushions between the bones and the overlying soft tissues and help reduce friction between the gliding tendons and the bone as you move your arm. Your shoulder joint allows a wide range of motion in your arm. This range of motion allows you to do things like scratch your back or throw a ball. However, this range of motion also makes your shoulder more prone to pain from overuse and injury. Causes of shoulder pain can originate from both injury and overuse and usually can be grouped in the following four categories:  Redness, swelling, and pain (inflammation) of the tendon (tendinitis) or the bursae (bursitis).  Instability, such as a dislocation of the joint.  Inflammation of the joint (arthritis).  Broken bone (fracture). HOME CARE INSTRUCTIONS   Apply ice to the sore area.  Put ice in a plastic bag.  Place a towel between your skin and the bag.  Leave the ice on for 15-20 minutes, 03-04 times per day for the first 2 days.  Stop using cold packs if they do not help with the pain.  If you have a shoulder sling or immobilizer, wear it as long as your caregiver instructs. Only remove it to shower or bathe. Move your arm as little as possible, but keep your hand moving to prevent swelling.  Squeeze a soft ball or foam pad as much as possible to help prevent swelling.  Only take over-the-counter or prescription medicines for  pain, discomfort, or fever as directed by your caregiver. SEEK MEDICAL CARE IF:   Your shoulder pain increases, or new pain develops in your arm, hand, or fingers.  Your hand or fingers become cold and numb.  Your pain is not relieved with medicines. SEEK IMMEDIATE MEDICAL CARE IF:   Your arm, hand, or fingers are numb or tingling.  Your arm, hand, or fingers are significantly swollen or turn white or blue. MAKE SURE YOU:   Understand these instructions.  Will watch your condition.  Will get help right away if you are not doing well or get worse. Document Released: 08/03/2005 Document Revised: 07/18/2012 Document Reviewed: 10/08/2011 Exit[employer] Patient Information 2014 [employer].  

## 2014-01-23 NOTE — Progress Notes (Signed)
Patient ID: Norma Ford, female   DOB: 1930/10/10, 80 y.o.   MRN: [id number]   Subjective:    Patient ID: Norma Ford, female    DOB: 1930/03/16, 80 y.o.   MRN: [id number] HPI Pt here c/o b/l shoulder pain -- she was already dx with torn Rotator cuff tear.  Pt had an injecton over a year ago that helped.  She thinks she needs another one but needs something to help with pain in the meantime.  The ultram is not working.           Objective:    BP 128/80  Pulse 78  Temp(Src) 98.5 F (36.9 C) (Oral)  Wt 275 lb 9.6 oz (125.011 kg)  SpO2 98% General appearance: alert, cooperative, appears stated age and no distress Eyes: negative Extremities: decrease rom b/l shoulders pain with any pressure        Assessment & Plan:  1. Pain in joint, shoulder region Refer to ortho rx pain meds - HYDROcodone-acetaminophen (NORCO/VICODIN) 5-325 MG per tablet; Take 1 tablet by mouth every 6 (six) hours as needed for moderate pain.  Dispense: 90 tablet; Refill: 0 - AMB referral to orthopedics

## 2014-02-10 ENCOUNTER — Other Ambulatory Visit: Payer: Self-pay | Admitting: Family Medicine

## 2014-02-20 ENCOUNTER — Telehealth: Payer: Self-pay

## 2014-02-20 NOTE — Telephone Encounter (Signed)
Medication and allergies:  Reviewed and updated  90 day supply/mail order: n/a Local pharmacy:  [facility] [place], [place] - [address]   Immunizations due:  Td/Tdap   A/P: No changes to personal, family history or past surgical hx PAP- no longer receiving  CCS- 10/20/11 MMG- 06/02/05- benign findings Flu- did not receive Tdap- DUE PNA- 09/23/10 Shingles- 10/20/11  To Discuss with Provider: Nothing at this time.

## 2014-02-24 ENCOUNTER — Encounter: Payer: Self-pay | Admitting: Family Medicine

## 2014-02-24 ENCOUNTER — Ambulatory Visit (INDEPENDENT_AMBULATORY_CARE_PROVIDER_SITE_OTHER): Payer: Medicare HMO | Admitting: Family Medicine

## 2014-02-24 VITALS — BP 112/74 | HR 68 | Temp 97.9°F | Ht 62.0 in | Wt 277.0 lb

## 2014-02-24 DIAGNOSIS — M25519 Pain in unspecified shoulder: Secondary | ICD-10-CM

## 2014-02-24 DIAGNOSIS — F329 Major depressive disorder, single episode, unspecified: Secondary | ICD-10-CM

## 2014-02-24 DIAGNOSIS — E785 Hyperlipidemia, unspecified: Secondary | ICD-10-CM

## 2014-02-24 DIAGNOSIS — R32 Unspecified urinary incontinence: Secondary | ICD-10-CM

## 2014-02-24 DIAGNOSIS — F32A Depression, unspecified: Secondary | ICD-10-CM

## 2014-02-24 DIAGNOSIS — Z0000 Encounter for general adult medical examination without abnormal findings: Secondary | ICD-10-CM

## 2014-02-24 DIAGNOSIS — I10 Essential (primary) hypertension: Secondary | ICD-10-CM

## 2014-02-24 DIAGNOSIS — F3289 Other specified depressive episodes: Secondary | ICD-10-CM

## 2014-02-24 LAB — LIPID PANEL
Cholesterol: 123 mg/dL (ref 0–200)
HDL: 50.5 mg/dL (ref >39.00)
LDL CALC: 64 mg/dL (ref 0–99)
TRIGLYCERIDES: 43 mg/dL (ref 0.0–149.0)
Total CHOL/HDL Ratio: 2
VLDL: 8.6 mg/dL (ref 0.0–40.0)

## 2014-02-24 LAB — CBC WITH DIFFERENTIAL/PLATELET
BASOS ABS: 0 10*3/uL (ref 0.0–0.1)
Basophils Relative: 0.5 % (ref 0.0–3.0)
EOS ABS: 0.2 10*3/uL (ref 0.0–0.7)
Eosinophils Relative: 3 % (ref 0.0–5.0)
HCT: 41.3 % (ref 36.0–46.0)
Hemoglobin: 13.6 g/dL (ref 12.0–15.0)
LYMPHS PCT: 21.7 % (ref 12.0–46.0)
Lymphs Abs: 1.3 10*3/uL (ref 0.7–4.0)
MCHC: 33 g/dL (ref 30.0–36.0)
MCV: 97.5 fl (ref 78.0–100.0)
Monocytes Absolute: 0.8 10*3/uL (ref 0.1–1.0)
Monocytes Relative: 13.1 % — ABNORMAL HIGH (ref 3.0–12.0)
NEUTROS PCT: 61.7 % (ref 43.0–77.0)
Neutro Abs: 3.6 10*3/uL (ref 1.4–7.7)
PLATELETS: 172 10*3/uL (ref 150.0–400.0)
RBC: 4.24 Mil/uL (ref 3.87–5.11)
RDW: 14.1 % (ref 11.5–14.6)
WBC: 5.9 10*3/uL (ref 4.5–10.5)

## 2014-02-24 LAB — POCT URINALYSIS DIPSTICK
Bilirubin, UA: NEGATIVE
Glucose, UA: NEGATIVE
Ketones, UA: NEGATIVE
Leukocytes, UA: NEGATIVE
Nitrite, UA: NEGATIVE
PH UA: 6.5
PROTEIN UA: NEGATIVE
RBC UA: NEGATIVE
Spec Grav, UA: 1.015
UROBILINOGEN UA: 0.2

## 2014-02-24 LAB — HEPATIC FUNCTION PANEL
ALBUMIN: 3.6 g/dL (ref 3.5–5.2)
ALT: 15 U/L (ref 0–35)
AST: 15 U/L (ref 0–37)
Alkaline Phosphatase: 48 U/L (ref 39–117)
BILIRUBIN TOTAL: 0.8 mg/dL (ref 0.3–1.2)
Bilirubin, Direct: 0.1 mg/dL (ref 0.0–0.3)
Total Protein: 7.3 g/dL (ref 6.0–8.3)

## 2014-02-24 LAB — BASIC METABOLIC PANEL
BUN: 17 mg/dL (ref 6–23)
CALCIUM: 9.1 mg/dL (ref 8.4–10.5)
CO2: 30 mEq/L (ref 19–32)
CREATININE: 0.7 mg/dL (ref 0.4–1.2)
Chloride: 103 mEq/L (ref 96–112)
GFR: 85.23 mL/min (ref >60.00)
GLUCOSE: 74 mg/dL (ref 70–99)
Potassium: 3.6 mEq/L (ref 3.5–5.1)
SODIUM: 140 meq/L (ref 135–145)

## 2014-02-24 MED ORDER — TRAMADOL HCL 50 MG PO TABS: 50.0000 mg | ORAL_TABLET | Freq: Three times a day (TID) | ORAL | Status: DC | PRN

## 2014-02-24 MED ORDER — SERTRALINE HCL 50 MG PO TABS: 50.0000 mg | ORAL_TABLET | Freq: Every day | ORAL | Status: DC

## 2014-02-24 MED ORDER — HYDROCODONE-ACETAMINOPHEN 5-325 MG PO TABS: 1.0000 | ORAL_TABLET | Freq: Four times a day (QID) | ORAL | Status: DC | PRN

## 2014-02-24 NOTE — Progress Notes (Signed)
Subjective:    Norma Ford is a 80 y.o. female who presents for Medicare Annual/Subsequent preventive examination.  Preventive Screening-Counseling & Management  Tobacco History  Smoking status  . Never Smoker   Smokeless tobacco  . Never Used     Problems Prior to Visit 1. "dropped bladder  Current Problems (verified) Patient Active Problem List   Diagnosis Date Noted  . Severe obesity (BMI >= 40) 02/24/2014  . Cellulitis and abscess of lower leg 05/21/2012  . Calf pain 05/21/2012  . Edema 05/21/2012  . Chronic bronchitis 11/03/2011  . Hypoxia 10/20/2011  . THROMBOCYTOPENIA 12/21/2009  . DECUBITUS ULCER, BUTTOCK 12/21/2009  . MYCOSIS FUNGOIDES LYMPH NODES MULTIPLE SITES 09/21/2009  . DEGENERATIVE JOINT DISEASE, KNEE 09/21/2009  . SHOULDER PAIN, RIGHT 09/21/2009  . FEMALE STRESS INCONTINENCE 01/16/2009  . HYPERLIPIDEMIA 02/12/2008  . HYPERTENSION 02/12/2008    Medications Prior to Visit Current Outpatient Prescriptions on File Prior to Visit  Medication Sig Dispense Refill  . furosemide (LASIX) 40 MG tablet Take 1 tablet (40 mg total) by mouth 2 (two) times daily.  180 tablet  0  . HYDROcodone-acetaminophen (NORCO/VICODIN) 5-325 MG per tablet Take 1 tablet by mouth every 6 (six) hours as needed for moderate pain.  90 tablet  0  . metoprolol succinate (TOPROL-XL) 50 MG 24 hr tablet Take 1 tablet by mouth daily--office visit due now  90 tablet  0  . mometasone (NASONEX) 50 MCG/ACT nasal spray Place 2 sprays into the nose daily.        . Multiple Vitamin (MULTIVITAMIN) tablet Take 1 tablet by mouth daily.        . polyethylene glycol powder (GLYCOLAX/MIRALAX) powder Take 17 g by mouth 2 (two) times daily as needed.  3350 g  1  . potassium chloride SA (K-DUR,KLOR-CON) 20 MEQ tablet Take 1 tablet (20 mEq total) by mouth 2 (two) times daily.  180 tablet  1  . simvastatin (ZOCOR) 80 MG tablet Take 1 tablet (80 mg total) by mouth at bedtime.  90 tablet  0   No current  facility-administered medications on file prior to visit.    Current Medications (verified) Current Outpatient Prescriptions  Medication Sig Dispense Refill  . furosemide (LASIX) 40 MG tablet Take 1 tablet (40 mg total) by mouth 2 (two) times daily.  180 tablet  0  . HYDROcodone-acetaminophen (NORCO/VICODIN) 5-325 MG per tablet Take 1 tablet by mouth every 6 (six) hours as needed for moderate pain.  90 tablet  0  . metoprolol succinate (TOPROL-XL) 50 MG 24 hr tablet Take 1 tablet by mouth daily--office visit due now  90 tablet  0  . mometasone (NASONEX) 50 MCG/ACT nasal spray Place 2 sprays into the nose daily.        . Multiple Vitamin (MULTIVITAMIN) tablet Take 1 tablet by mouth daily.        . polyethylene glycol powder (GLYCOLAX/MIRALAX) powder Take 17 g by mouth 2 (two) times daily as needed.  3350 g  1  . potassium chloride SA (K-DUR,KLOR-CON) 20 MEQ tablet Take 1 tablet (20 mEq total) by mouth 2 (two) times daily.  180 tablet  1  . simvastatin (ZOCOR) 80 MG tablet Take 1 tablet (80 mg total) by mouth at bedtime.  90 tablet  0   No current facility-administered medications for this visit.     Allergies (verified) Codeine   PAST HISTORY  Family History Family History  Problem Relation Age of Onset  . Heart disease Mother   .  Heart disease Father     pacemaker  . Kidney disease Father     Social History History  Substance Use Topics  . Smoking status: Never Smoker   . Smokeless tobacco: Never Used  . Alcohol Use: Not on file     Are there smokers in your home (other than you)? No  Risk Factors Current exercise habits: The patient does not participate in regular exercise at present.  Dietary issues discussed: na   Cardiac risk factors: advanced age (older than 3 for men, 68 for women), dyslipidemia, hypertension, obesity (BMI >= 30 kg/m2) and sedentary lifestyle.  Depression Screen (Note: if answer to either of the following is "Yes", a more complete depression  screening is indicated)   Over the past two weeks, have you felt down, depressed or hopeless? Yes  Over the past two weeks, have you felt little interest or pleasure in doing things? Yes  Have you lost interest or pleasure in daily life? Yes  Do you often feel hopeless? Yes  Do you cry easily over simple problems? Yes  Activities of Daily Living In your present state of health, do you have any difficulty performing the following activities?:  Driving? Yes Managing money?  No Feeding yourself? No Getting from bed to chair? Yes Climbing a flight of stairs? Yes Preparing food and eating?: No Bathing or showering? No Getting dressed: No Getting to the toilet? No Using the toilet:No Moving around from place to place: No In the past year have you fallen or had a near fall?:No   Are you sexually active?  No  Do you have more than one partner?  No  Hearing Difficulties: No Do you often ask people to speak up or repeat themselves? No Do you experience ringing or noises in your ears? No Do you have difficulty understanding soft or whispered voices? No   Do you feel that you have a problem with memory? No  Do you often misplace items? No  Do you feel safe at home?  Yes  Cognitive Testing  Alert? Yes  Normal Appearance?Yes  Oriented to person? Yes  Place? Yes   Time? Yes  Recall of three objects?  Yes  Can perform simple calculations? Yes  Displays appropriate judgment?Yes  Can read the correct time from a watch face?Yes   Advanced Directives have been discussed with the patient? Yes  List the Names of Other Physician/Practitioners you currently use: 1.  opth--? Forgot name 2. Ortho--[place]   Indicate any recent Medical Services you may have received from other than [facility] providers in the past year (date may be approximate).  Immunization History  Administered Date(s) Administered  . Influenza Whole 09/23/2010  . Pneumococcal Polysaccharide-23 09/23/2010    Screening  Tests Health Maintenance  Topic Date Due  . Tetanus/tdap  07/14/1949  . Mammogram  10/19/2012  . Influenza Vaccine  06/07/2014  . Colonoscopy  10/19/2021  . Pneumococcal Polysaccharide Vaccine Age 35 And Over  Completed  . Zostavax  Addressed    All answers were reviewed with the patient and necessary referrals were made:  Garnet Koyanagi, DO   02/24/2014   History reviewed:  She  has a past medical history of Hyperlipidemia; HTN (hypertension); Cataracts, bilateral; History of abnormal mammogram; Adrenal tumor (2002); Kidney stone; Barrett's esophagus (1999); THROMBOCYTOPENIA (12/21/2009); MYCOSIS FUNGOIDES LYMPH NODES MULTIPLE SITES (09/21/2009); and DECUBITUS ULCER, BUTTOCK (12/21/2009). She  does not have any pertinent problems on file. She  has past surgical history that includes Total knee  arthroplasty; Esophagogastroduodenoscopy (03/2002); tubal ligation; Abdominal hysterectomy; Tonsillectomy; and appendectomy. Her family history includes Heart disease in her father and mother; Kidney disease in her father. She  reports that she has never smoked. She has never used smokeless tobacco. Her alcohol and drug histories are not on file. She has a current medication list which includes the following prescription(s): furosemide, hydrocodone-acetaminophen, metoprolol succinate, mometasone, multivitamin, polyethylene glycol powder, potassium chloride sa, and simvastatin. Current Outpatient Prescriptions on File Prior to Visit  Medication Sig Dispense Refill  . furosemide (LASIX) 40 MG tablet Take 1 tablet (40 mg total) by mouth 2 (two) times daily.  180 tablet  0  . HYDROcodone-acetaminophen (NORCO/VICODIN) 5-325 MG per tablet Take 1 tablet by mouth every 6 (six) hours as needed for moderate pain.  90 tablet  0  . metoprolol succinate (TOPROL-XL) 50 MG 24 hr tablet Take 1 tablet by mouth daily--office visit due now  90 tablet  0  . mometasone (NASONEX) 50 MCG/ACT nasal spray Place 2 sprays into the  nose daily.        . Multiple Vitamin (MULTIVITAMIN) tablet Take 1 tablet by mouth daily.        . polyethylene glycol powder (GLYCOLAX/MIRALAX) powder Take 17 g by mouth 2 (two) times daily as needed.  3350 g  1  . potassium chloride SA (K-DUR,KLOR-CON) 20 MEQ tablet Take 1 tablet (20 mEq total) by mouth 2 (two) times daily.  180 tablet  1  . simvastatin (ZOCOR) 80 MG tablet Take 1 tablet (80 mg total) by mouth at bedtime.  90 tablet  0   No current facility-administered medications on file prior to visit.   She is allergic to codeine.  Review of Systems Review of Systems  Constitutional: Negative for activity change, appetite change and fatigue.  HENT: Negative for hearing loss, congestion, tinnitus and ear discharge.  dentist---doesn't go Eyes: Negative for visual disturbance (see optho q1y -- vision corrected to 20/20 with glasses).  Respiratory: Negative for cough, chest tightness and shortness of breath.   Cardiovascular: Negative for chest pain, palpitations and leg swelling.  Gastrointestinal: Negative for abdominal pain, diarrhea, constipation and abdominal distention.  Genitourinary: Negative for urgency, frequency, decreased urine volume and difficulty urinating.  Musculoskeletal: Negative for back pain, arthralgias and gait problem.  Skin: Negative for color change, pallor and rash.  Neurological: Negative for dizziness, light-headedness, numbness and headaches.  Hematological: Negative for adenopathy. Does not bruise/bleed easily.  Psychiatric/Behavioral: Negative for suicidal ideas, confusion, sleep disturbance, self-injury, dysphoric mood, decreased concentration and agitation.        Objective:     Vision by Snellen chart: opth  Body mass index is 50.65 kg/(m^2). BP 112/74  Pulse 68  Temp(Src) 97.9 F (36.6 C) (Oral)  Ht 5\' 2"  (1.575 m)  Wt 277 lb (125.646 kg)  BMI 50.65 kg/m2  SpO2 95%  BP 112/74  Pulse 68  Temp(Src) 97.9 F (36.6 C) (Oral)  Ht 5\' 2"   (1.575 m)  Wt 277 lb (125.646 kg)  BMI 50.65 kg/m2  SpO2 95% General appearance: alert, cooperative, appears stated age and no distress Head: Normocephalic, without obvious abnormality, atraumatic Eyes: negative findings: lids and lashes normal and conjunctivae and sclerae normal Ears: normal TM's and external ear canals both ears Nose: Nares normal. Septum midline. Mucosa normal. No drainage or sinus tenderness. Throat: lips, mucosa, and tongue normal; teeth and gums normal Neck: no adenopathy, no carotid bruit, no JVD, supple, symmetrical, trachea midline and thyroid not enlarged, symmetric, no  tenderness/mass/nodules Back: symmetric, no curvature. ROM normal. No CVA tenderness. Lungs: clear to auscultation bilaterally Breasts: pt preferred not to undress Heart: regular rate and rhythm, S1, S2 normal, no murmur, click, rub or gallop Abdomen: soft, non-tender; bowel sounds normal; no masses,  no organomegaly Pelvic: not indicated; post-menopausal, no abnormal Pap smears in past Extremities: extremities normal, atraumatic, no cyanosis or edema Pulses: 2+ and symmetric Skin: Skin color, texture, turgor normal. No rashes or lesions Lymph nodes: Cervical, supraclavicular, and axillary nodes normal. Neurologic: Gait: walks with walker--- no change Psych-- teary, depressed.  notsuicidal      Assessment:     cpe      Plan:     During the course of the visit the patient was educated and counseled about appropriate screening and preventive services including:    Advanced directives: has an advanced directive - a copy HAS NOT been provided.  Pt refuses mammogram, bmd, vaccines, colonoscopy  Diet review for nutrition referral? Yes ____  Not Indicated _x___   Patient Instructions (the written plan) was given to the patient.  Medicare Attestation I have personally reviewed: The patient's medical and social history Their use of alcohol, tobacco or illicit drugs Their current  medications and supplements The patient's functional ability including ADLs,fall risks, home safety risks, cognitive, and hearing and visual impairment Diet and physical activities Evidence for depression or mood disorders  The patient's weight, height, BMI, and visual acuity have been recorded in the chart.  I have made referrals, counseling, and provided education to the patient based on review of the above and I have provided the patient with a written personalized care plan for preventive services.    1. Other and unspecified hyperlipidemia Check labs, con't meds - Hepatic function panel - Lipid panel - POCT urinalysis dipstick  2. HTN (hypertension) Stable con't meds - Basic metabolic panel - CBC with Differential - POCT urinalysis dipstick  3. Pain in joint, shoulder region Refill pain meds - HYDROcodone-acetaminophen (NORCO/VICODIN) 5-325 MG per tablet; Take 1 tablet by mouth every 6 (six) hours as needed for moderate pain.  Dispense: 90 tablet; Refill: 0 - traMADol (ULTRAM) 50 MG tablet; Take 1 tablet (50 mg total) by mouth every 8 (eight) hours as needed.  Dispense: 30 tablet; Refill: 0  4. Depression Start meds-- recheck 4-6 weeks or sooner prn - sertraline (ZOLOFT) 50 MG tablet; Take 1 tablet (50 mg total) by mouth daily.  Dispense: 30 tablet; Refill: 3  5. Medicare annual wellness visit, subsequent See above  6. Urinary incontinence-- pt dx with prolapse bladder in past-- refer to urology Garnet Koyanagi, DO   02/24/2014

## 2014-02-24 NOTE — Progress Notes (Signed)
Pre visit review using our clinic review tool, if applicable. No additional management support is needed unless otherwise documented below in the visit note. 

## 2014-02-24 NOTE — Patient Instructions (Signed)
Preventive Care for Adults, Female A healthy lifestyle and preventive care can promote health and wellness. Preventive health guidelines for women include the following key practices.  A routine yearly physical is a good way to check with your health care provider about your health and preventive screening. It is a chance to share any concerns and updates on your health and to receive a thorough exam.  Visit your dentist for a routine exam and preventive care every 6 months. Brush your teeth twice a day and floss once a day. Good oral hygiene prevents tooth decay and gum disease.  The frequency of eye exams is based on your age, health, family medical history, use of contact lenses, and other factors. Follow your health care provider's recommendations for frequency of eye exams.  Eat a healthy diet. Foods like vegetables, fruits, whole grains, low-fat dairy products, and lean protein foods contain the nutrients you need without too many calories. Decrease your intake of foods high in solid fats, added sugars, and salt. Eat the right amount of calories for you.Get information about a proper diet from your health care provider, if necessary.  Regular physical exercise is one of the most important things you can do for your health. Most adults should get at least 150 minutes of moderate-intensity exercise (any activity that increases your heart rate and causes you to sweat) each week. In addition, most adults need muscle-strengthening exercises on 2 or more days a week.  Maintain a healthy weight. The body mass index (BMI) is a screening tool to identify possible weight problems. It provides an estimate of body fat based on height and weight. Your health care provider can find your BMI, and can help you achieve or maintain a healthy weight.For adults 20 years and older:  A BMI below 18.5 is considered underweight.  A BMI of 18.5 to 24.9 is normal.  A BMI of 25 to 29.9 is considered overweight.  A  BMI of 30 and above is considered obese.  Maintain normal blood lipids and cholesterol levels by exercising and minimizing your intake of saturated fat. Eat a balanced diet with plenty of fruit and vegetables. Blood tests for lipids and cholesterol should begin at age 62 and be repeated every 5 years. If your lipid or cholesterol levels are high, you are over 50, or you are at high risk for heart disease, you may need your cholesterol levels checked more frequently.Ongoing high lipid and cholesterol levels should be treated with medicines if diet and exercise are not working.  If you smoke, find out from your health care provider how to quit. If you do not use tobacco, do not start.  Lung cancer screening is recommended for adults aged 36 80 years who are at high risk for developing lung cancer because of a history of smoking. A yearly low-dose CT scan of the lungs is recommended for people who have at least a 30-pack-year history of smoking and are a current smoker or have quit within the past 15 years. A pack year of smoking is smoking an average of 1 pack of cigarettes a day for 1 year (for example: 1 pack a day for 30 years or 2 packs a day for 15 years). Yearly screening should continue until the smoker has stopped smoking for at least 15 years. Yearly screening should be stopped for people who develop a health problem that would prevent them from having lung cancer treatment.  If you are pregnant, do not drink alcohol. If you  are breastfeeding, be very cautious about drinking alcohol. If you are not pregnant and choose to drink alcohol, do not have more than 1 drink per day. One drink is considered to be 12 ounces (355 mL) of beer, 5 ounces (148 mL) of wine, or 1.5 ounces (44 mL) of liquor.  Avoid use of street drugs. Do not share needles with anyone. Ask for help if you need support or instructions about stopping the use of drugs.  High blood pressure causes heart disease and increases the risk  of stroke. Your blood pressure should be checked at least every 1 to 2 years. Ongoing high blood pressure should be treated with medicines if weight loss and exercise do not work.  If you are 39 78 years old, ask your health care provider if you should take aspirin to prevent strokes.  Diabetes screening involves taking a blood sample to check your fasting blood sugar level. This should be done once every 3 years, after age 56, if you are within normal weight and without risk factors for diabetes. Testing should be considered at a younger age or be carried out more frequently if you are overweight and have at least 1 risk factor for diabetes.  Breast cancer screening is essential preventive care for women. You should practice "breast self-awareness." This means understanding the normal appearance and feel of your breasts and may include breast self-examination. Any changes detected, no matter how small, should be reported to a health care provider. Women in their 40s and 30s should have a clinical breast exam (CBE) by a health care provider as part of a regular health exam every 1 to 3 years. After age 28, women should have a CBE every year. Starting at age 72, women should consider having a mammogram (breast X-ray test) every year. Women who have a family history of breast cancer should talk to their health care provider about genetic screening. Women at a high risk of breast cancer should talk to their health care providers about having an MRI and a mammogram every year.  Breast cancer gene (BRCA)-related cancer risk assessment is recommended for women who have family members with BRCA-related cancers. BRCA-related cancers include breast, ovarian, tubal, and peritoneal cancers. Having family members with these cancers may be associated with an increased risk for harmful changes (mutations) in the breast cancer genes BRCA1 and BRCA2. Results of the assessment will determine the need for genetic counseling  and BRCA1 and BRCA2 testing.  The Pap test is a screening test for cervical cancer. A Pap test can show cell changes on the cervix that might become cervical cancer if left untreated. A Pap test is a procedure in which cells are obtained and examined from the lower end of the uterus (cervix).  Women should have a Pap test starting at age 59.  Between ages 42 and 13, Pap tests should be repeated every 2 years.  Beginning at age 53, you should have a Pap test every 3 years as long as the past 3 Pap tests have been normal.  Some women have medical problems that increase the chance of getting cervical cancer. Talk to your health care provider about these problems. It is especially important to talk to your health care provider if a new problem develops soon after your last Pap test. In these cases, your health care provider may recommend more frequent screening and Pap tests.  The above recommendations are the same for women who have or have not gotten the vaccine  for human papillomavirus (HPV).  If you had a hysterectomy for a problem that was not cancer or a condition that could lead to cancer, then you no longer need Pap tests. Even if you no longer need a Pap test, a regular exam is a good idea to make sure no other problems are starting.  If you are between ages 41 and 45 years, and you have had normal Pap tests going back 10 years, you no longer need Pap tests. Even if you no longer need a Pap test, a regular exam is a good idea to make sure no other problems are starting.  If you have had past treatment for cervical cancer or a condition that could lead to cancer, you need Pap tests and screening for cancer for at least 20 years after your treatment.  If Pap tests have been discontinued, risk factors (such as a new sexual partner) need to be reassessed to determine if screening should be resumed.  The HPV test is an additional test that may be used for cervical cancer screening. The HPV test  looks for the virus that can cause the cell changes on the cervix. The cells collected during the Pap test can be tested for HPV. The HPV test could be used to screen women aged 24 years and older, and should be used in women of any age who have unclear Pap test results. After the age of 45, women should have HPV testing at the same frequency as a Pap test.  Colorectal cancer can be detected and often prevented. Most routine colorectal cancer screening begins at the age of 81 years and continues through age 20 years. However, your health care provider may recommend screening at an earlier age if you have risk factors for colon cancer. On a yearly basis, your health care provider may provide home test kits to check for hidden blood in the stool. Use of a small camera at the end of a tube, to directly examine the colon (sigmoidoscopy or colonoscopy), can detect the earliest forms of colorectal cancer. Talk to your health care provider about this at age 47, when routine screening begins. Direct exam of the colon should be repeated every 5 10 years through age 39 years, unless early forms of pre-cancerous polyps or small growths are found.  People who are at an increased risk for hepatitis B should be screened for this virus. You are considered at high risk for hepatitis B if:  You were born in a country where hepatitis B occurs often. Talk with your health care provider about which countries are considered high risk.  Your parents were born in a high-risk country and you have not received a shot to protect against hepatitis B (hepatitis B vaccine).  You have HIV or AIDS.  You use needles to inject street drugs.  You live with, or have sex with, someone who has Hepatitis B.  You get hemodialysis treatment.  You take certain medicines for conditions like cancer, organ transplantation, and autoimmune conditions.  Hepatitis C blood testing is recommended for all people born from [birth year] through 1965 and  any individual with known risks for hepatitis C.  Practice safe sex. Use condoms and avoid high-risk sexual practices to reduce the spread of sexually transmitted infections (STIs). STIs include gonorrhea, chlamydia, syphilis, trichomonas, herpes, HPV, and human immunodeficiency virus (HIV). Herpes, HIV, and HPV are viral illnesses that have no cure. They can result in disability, cancer, and death. Sexually active women aged 75  years and younger should be checked for chlamydia. Older women with new or multiple partners should also be tested for chlamydia. Testing for other STIs is recommended if you are sexually active and at increased risk.  Osteoporosis is a disease in which the bones lose minerals and strength with aging. This can result in serious bone fractures or breaks. The risk of osteoporosis can be identified using a bone density scan. Women ages 18 years and over and women at risk for fractures or osteoporosis should discuss screening with their health care providers. Ask your health care provider whether you should take a calcium supplement or vitamin D to reduce the rate of osteoporosis.  Menopause can be associated with physical symptoms and risks. Hormone replacement therapy is available to decrease symptoms and risks. You should talk to your health care provider about whether hormone replacement therapy is right for you.  Use sunscreen. Apply sunscreen liberally and repeatedly throughout the day. You should seek shade when your shadow is shorter than you. Protect yourself by wearing long sleeves, pants, a wide-brimmed hat, and sunglasses year round, whenever you are outdoors.  Once a month, do a whole body skin exam, using a mirror to look at the skin on your back. Tell your health care provider of new moles, moles that have irregular borders, moles that are larger than a pencil eraser, or moles that have changed in shape or color.  Stay current with required vaccines  (immunizations).  Influenza vaccine. All adults should be immunized every year.  Tetanus, diphtheria, and acellular pertussis (Td, Tdap) vaccine. Pregnant women should receive 1 dose of Tdap vaccine during each pregnancy. The dose should be obtained regardless of the length of time since the last dose. Immunization is preferred during the 27th 36th week of gestation. An adult who has not previously received Tdap or who does not know her vaccine status should receive 1 dose of Tdap. This initial dose should be followed by tetanus and diphtheria toxoids (Td) booster doses every 10 years. Adults with an unknown or incomplete history of completing a 3-dose immunization series with Td-containing vaccines should begin or complete a primary immunization series including a Tdap dose. Adults should receive a Td booster every 10 years.  Varicella vaccine. An adult without evidence of immunity to varicella should receive 2 doses or a second dose if she has previously received 1 dose. Pregnant females who do not have evidence of immunity should receive the first dose after pregnancy. This first dose should be obtained before leaving the health care facility. The second dose should be obtained 4 8 weeks after the first dose.  Human papillomavirus (HPV) vaccine. Females aged 9 26 years who have not received the vaccine previously should obtain the 3-dose series. The vaccine is not recommended for use in pregnant females. However, pregnancy testing is not needed before receiving a dose. If a female is found to be pregnant after receiving a dose, no treatment is needed. In that case, the remaining doses should be delayed until after the pregnancy. Immunization is recommended for any person with an immunocompromised condition through the age of 51 years if she did not get any or all doses earlier. During the 3-dose series, the second dose should be obtained 4 8 weeks after the first dose. The third dose should be obtained  24 weeks after the first dose and 16 weeks after the second dose.  Zoster vaccine. One dose is recommended for adults aged 57 years or older unless certain  conditions are present.  Measles, mumps, and rubella (MMR) vaccine. Adults born before [birth year] generally are considered immune to measles and mumps. Adults born in [birth year] or later should have 1 or more doses of MMR vaccine unless there is a contraindication to the vaccine or there is laboratory evidence of immunity to each of the three diseases. A routine second dose of MMR vaccine should be obtained at least 28 days after the first dose for students attending postsecondary schools, health care workers, or international travelers. People who received inactivated measles vaccine or an unknown type of measles vaccine during 1963 1967 should receive 2 doses of MMR vaccine. People who received inactivated mumps vaccine or an unknown type of mumps vaccine before 1979 and are at high risk for mumps infection should consider immunization with 2 doses of MMR vaccine. For females of childbearing age, rubella immunity should be determined. If there is no evidence of immunity, females who are not pregnant should be vaccinated. If there is no evidence of immunity, females who are pregnant should delay immunization until after pregnancy. Unvaccinated health care workers born before [birth year] who lack laboratory evidence of measles, mumps, or rubella immunity or laboratory confirmation of disease should consider measles and mumps immunization with 2 doses of MMR vaccine or rubella immunization with 1 dose of MMR vaccine.  Pneumococcal 13-valent conjugate (PCV13) vaccine. When indicated, a person who is uncertain of her immunization history and has no record of immunization should receive the PCV13 vaccine. An adult aged 6 years or older who has certain medical conditions and has not been previously immunized should receive 1 dose of PCV13 vaccine. This PCV13 should be followed  with a dose of pneumococcal polysaccharide (PPSV23) vaccine. The PPSV23 vaccine dose should be obtained at least 8 weeks after the dose of PCV13 vaccine. An adult aged 72 years or older who has certain medical conditions and previously received 1 or more doses of PPSV23 vaccine should receive 1 dose of PCV13. The PCV13 vaccine dose should be obtained 1 or more years after the last PPSV23 vaccine dose.  Pneumococcal polysaccharide (PPSV23) vaccine. When PCV13 is also indicated, PCV13 should be obtained first. All adults aged 79 years and older should be immunized. An adult younger than age 57 years who has certain medical conditions should be immunized. Any person who resides in a nursing home or long-term care facility should be immunized. An adult smoker should be immunized. People with an immunocompromised condition and certain other conditions should receive both PCV13 and PPSV23 vaccines. People with human immunodeficiency virus (HIV) infection should be immunized as soon as possible after diagnosis. Immunization during chemotherapy or radiation therapy should be avoided. Routine use of PPSV23 vaccine is not recommended for American Indians, [place] Natives, or people younger than 65 years unless there are medical conditions that require PPSV23 vaccine. When indicated, people who have unknown immunization and have no record of immunization should receive PPSV23 vaccine. One-time revaccination 5 years after the first dose of PPSV23 is recommended for people aged 81 64 years who have chronic kidney failure, nephrotic syndrome, asplenia, or immunocompromised conditions. People who received 1 2 doses of PPSV23 before age 54 years should receive another dose of PPSV23 vaccine at age 66 years or later if at least 5 years have passed since the previous dose. Doses of PPSV23 are not needed for people immunized with PPSV23 at or after age 67 years.  Meningococcal vaccine. Adults with asplenia or persistent complement  component deficiencies should receive 2  doses of quadrivalent meningococcal conjugate (MenACWY-D) vaccine. The doses should be obtained at least 2 months apart. Microbiologists working with certain meningococcal bacteria, [place] recruits, people at risk during an outbreak, and people who travel to or live in countries with a high rate of meningitis should be immunized. A first-year college student up through age 47 years who is living in a residence hall should receive a dose if she did not receive a dose on or after her 16th birthday. Adults who have certain high-risk conditions should receive one or more doses of vaccine.  Hepatitis A vaccine. Adults who wish to be protected from this disease, have certain high-risk conditions, work with hepatitis A-infected animals, work in hepatitis A research labs, or travel to or work in countries with a high rate of hepatitis A should be immunized. Adults who were previously unvaccinated and who anticipate close contact with an international adoptee during the first 60 days after arrival in the [place] from a country with a high rate of hepatitis A should be immunized.  Hepatitis B vaccine. Adults who wish to be protected from this disease, have certain high-risk conditions, may be exposed to blood or other infectious body fluids, are household contacts or sex partners of hepatitis B positive people, are clients or workers in certain care facilities, or travel to or work in countries with a high rate of hepatitis B should be immunized.  Haemophilus influenzae type b (Hib) vaccine. A previously unvaccinated person with asplenia or sickle cell disease or having a scheduled splenectomy should receive 1 dose of Hib vaccine. Regardless of previous immunization, a recipient of a hematopoietic stem cell transplant should receive a 3-dose series 6 12 months after her successful transplant. Hib vaccine is not recommended for adults with HIV infection. Preventive  Services / Frequency Ages 29 to 39years  Blood pressure check.** / Every 1 to 2 years.  Lipid and cholesterol check.** / Every 5 years beginning at age 44.  Clinical breast exam.** / Every 3 years for women in their 58s and 14s.  BRCA-related cancer risk assessment.** / For women who have family members with a BRCA-related cancer (breast, ovarian, tubal, or peritoneal cancers).  Pap test.** / Every 2 years from ages 10 through 51. Every 3 years starting at age 27 through age 60 or 31 with a history of 3 consecutive normal Pap tests.  HPV screening.** / Every 3 years from ages 60 through ages 59 to 64 with a history of 3 consecutive normal Pap tests.  Hepatitis C blood test.** / For any individual with known risks for hepatitis C.  Skin self-exam. / Monthly.  Influenza vaccine. / Every year.  Tetanus, diphtheria, and acellular pertussis (Tdap, Td) vaccine.** / Consult your health care provider. Pregnant women should receive 1 dose of Tdap vaccine during each pregnancy. 1 dose of Td every 10 years.  Varicella vaccine.** / Consult your health care provider. Pregnant females who do not have evidence of immunity should receive the first dose after pregnancy.  HPV vaccine. / 3 doses over 6 months, if 57 and younger. The vaccine is not recommended for use in pregnant females. However, pregnancy testing is not needed before receiving a dose.  Measles, mumps, rubella (MMR) vaccine.** / You need at least 1 dose of MMR if you were born in 1957 or later. You may also need a 2nd dose. For females of childbearing age, rubella immunity should be determined. If there is no evidence of immunity, females who are not  pregnant should be vaccinated. If there is no evidence of immunity, females who are pregnant should delay immunization until after pregnancy.  Pneumococcal 13-valent conjugate (PCV13) vaccine.** / Consult your health care provider.  Pneumococcal polysaccharide (PPSV23) vaccine.** / 1 to 2  doses if you smoke cigarettes or if you have certain conditions.  Meningococcal vaccine.** / 1 dose if you are age 88 to 6 years and a Market researcher living in a residence hall, or have one of several medical conditions, you need to get vaccinated against meningococcal disease. You may also need additional booster doses.  Hepatitis A vaccine.** / Consult your health care provider.  Hepatitis B vaccine.** / Consult your health care provider.  Haemophilus influenzae type b (Hib) vaccine.** / Consult your health care provider. Ages 23 to 64years  Blood pressure check.** / Every 1 to 2 years.  Lipid and cholesterol check.** / Every 5 years beginning at age 20 years.  Lung cancer screening. / Every year if you are aged 51 80 years and have a 30-pack-year history of smoking and currently smoke or have quit within the past 15 years. Yearly screening is stopped once you have quit smoking for at least 15 years or develop a health problem that would prevent you from having lung cancer treatment.  Clinical breast exam.** / Every year after age 8 years.  BRCA-related cancer risk assessment.** / For women who have family members with a BRCA-related cancer (breast, ovarian, tubal, or peritoneal cancers).  Mammogram.** / Every year beginning at age 10 years and continuing for as long as you are in good health. Consult with your health care provider.  Pap test.** / Every 3 years starting at age 30 years through age 5 or 61 years with a history of 3 consecutive normal Pap tests.  HPV screening.** / Every 3 years from ages 39 years through ages 72 to 19 years with a history of 3 consecutive normal Pap tests.  Fecal occult blood test (FOBT) of stool. / Every year beginning at age 59 years and continuing until age 27 years. You may not need to do this test if you get a colonoscopy every 10 years.  Flexible sigmoidoscopy or colonoscopy.** / Every 5 years for a flexible sigmoidoscopy or every  10 years for a colonoscopy beginning at age 110 years and continuing until age 63 years.  Hepatitis C blood test.** / For all people born from [birth year] through 1963 and any individual with known risks for hepatitis C.  Skin self-exam. / Monthly.  Influenza vaccine. / Every year.  Tetanus, diphtheria, and acellular pertussis (Tdap/Td) vaccine.** / Consult your health care provider. Pregnant women should receive 1 dose of Tdap vaccine during each pregnancy. 1 dose of Td every 10 years.  Varicella vaccine.** / Consult your health care provider. Pregnant females who do not have evidence of immunity should receive the first dose after pregnancy.  Zoster vaccine.** / 1 dose for adults aged 46 years or older.  Measles, mumps, rubella (MMR) vaccine.** / You need at least 1 dose of MMR if you were born in 1955 or later. You may also need a 2nd dose. For females of childbearing age, rubella immunity should be determined. If there is no evidence of immunity, females who are not pregnant should be vaccinated. If there is no evidence of immunity, females who are pregnant should delay immunization until after pregnancy.  Pneumococcal 13-valent conjugate (PCV13) vaccine.** / Consult your health care provider.  Pneumococcal polysaccharide (PPSV23) vaccine.** / 1  to 2 doses if you smoke cigarettes or if you have certain conditions.  Meningococcal vaccine.** / Consult your health care provider.  Hepatitis A vaccine.** / Consult your health care provider.  Hepatitis B vaccine.** / Consult your health care provider.  Haemophilus influenzae type b (Hib) vaccine.** / Consult your health care provider. Ages 76 years and over  Blood pressure check.** / Every 1 to 2 years.  Lipid and cholesterol check.** / Every 5 years beginning at age 89 years.  Lung cancer screening. / Every year if you are aged 59 80 years and have a 30-pack-year history of smoking and currently smoke or have quit within the past 15 years.  Yearly screening is stopped once you have quit smoking for at least 15 years or develop a health problem that would prevent you from having lung cancer treatment.  Clinical breast exam.** / Every year after age 59 years.  BRCA-related cancer risk assessment.** / For women who have family members with a BRCA-related cancer (breast, ovarian, tubal, or peritoneal cancers).  Mammogram.** / Every year beginning at age 26 years and continuing for as long as you are in good health. Consult with your health care provider.  Pap test.** / Every 3 years starting at age 62 years through age 10 or 54 years with 3 consecutive normal Pap tests. Testing can be stopped between 65 and 70 years with 3 consecutive normal Pap tests and no abnormal Pap or HPV tests in the past 10 years.  HPV screening.** / Every 3 years from ages 24 years through ages 68 or 49 years with a history of 3 consecutive normal Pap tests. Testing can be stopped between 65 and 70 years with 3 consecutive normal Pap tests and no abnormal Pap or HPV tests in the past 10 years.  Fecal occult blood test (FOBT) of stool. / Every year beginning at age 14 years and continuing until age 55 years. You may not need to do this test if you get a colonoscopy every 10 years.  Flexible sigmoidoscopy or colonoscopy.** / Every 5 years for a flexible sigmoidoscopy or every 10 years for a colonoscopy beginning at age 38 years and continuing until age 33 years.  Hepatitis C blood test.** / For all people born from [birth year] through 1963 and any individual with known risks for hepatitis C.  Osteoporosis screening.** / A one-time screening for women ages 59 years and over and women at risk for fractures or osteoporosis.  Skin self-exam. / Monthly.  Influenza vaccine. / Every year.  Tetanus, diphtheria, and acellular pertussis (Tdap/Td) vaccine.** / 1 dose of Td every 10 years.  Varicella vaccine.** / Consult your health care provider.  Zoster vaccine.** / 1  dose for adults aged 49 years or older.  Pneumococcal 13-valent conjugate (PCV13) vaccine.** / Consult your health care provider.  Pneumococcal polysaccharide (PPSV23) vaccine.** / 1 dose for all adults aged 54 years and older.  Meningococcal vaccine.** / Consult your health care provider.  Hepatitis A vaccine.** / Consult your health care provider.  Hepatitis B vaccine.** / Consult your health care provider.  Haemophilus influenzae type b (Hib) vaccine.** / Consult your health care provider. ** Family history and personal history of risk and conditions may change your health care provider's recommendations. Document Released: 12/20/2001 Document Revised: 08/14/2013 Document Reviewed: 03/21/2011 [facility] Patient Information 2014 [place], [place].

## 2014-02-25 ENCOUNTER — Telehealth: Payer: Self-pay | Admitting: Family Medicine

## 2014-02-25 NOTE — Telephone Encounter (Signed)
Relevant patient education assigned to patient using Emmi. ° °

## 2014-04-01 ENCOUNTER — Telehealth: Payer: Self-pay | Admitting: Family Medicine

## 2014-04-01 DIAGNOSIS — M25519 Pain in unspecified shoulder: Secondary | ICD-10-CM

## 2014-04-01 MED ORDER — HYDROCODONE-ACETAMINOPHEN 5-325 MG PO TABS: 1.0000 | ORAL_TABLET | Freq: Four times a day (QID) | ORAL | Status: DC | PRN

## 2014-04-01 NOTE — Telephone Encounter (Signed)
Refill x1 

## 2014-04-01 NOTE — Telephone Encounter (Signed)
Patient called to request a refill for HYDROcodone-acetaminophen (NORCO/VICODIN) 5-325 MG per tablet

## 2014-04-01 NOTE — Telephone Encounter (Signed)
Last seen and filled 02/24/14 #90. Please advise    KP

## 2014-04-24 ENCOUNTER — Telehealth: Payer: Self-pay | Admitting: Family Medicine

## 2014-04-24 NOTE — Telephone Encounter (Signed)
Last seen 02/24/14 and filled 04/01/14 #90. Please advise     KP

## 2014-04-24 NOTE — Telephone Encounter (Signed)
Too soon

## 2014-04-24 NOTE — Telephone Encounter (Signed)
[name]: patrica  Call back [phone]   Reason for call:   Pt would like a refill on rx HYDROcodone-acetaminophen (NORCO/VICODIN) 5-325 MG. Please call when ready

## 2014-04-28 MED ORDER — HYDROCODONE-ACETAMINOPHEN 5-325 MG PO TABS: 1.0000 | ORAL_TABLET | Freq: Four times a day (QID) | ORAL | Status: DC | PRN

## 2014-04-28 NOTE — Telephone Encounter (Signed)
Refill x1 

## 2014-04-28 NOTE — Telephone Encounter (Signed)
Patient aware medication ready for pick up.    KP

## 2014-04-28 NOTE — Telephone Encounter (Signed)
Last seen 02/24/14 and filled 04/01/14 #90. Please advise KP

## 2014-05-26 ENCOUNTER — Telehealth: Payer: Self-pay | Admitting: Family Medicine

## 2014-05-26 NOTE — Telephone Encounter (Addendum)
[name]:Ree Relation to pt: patient Call back [place]:  Reason for call:patient called stating that she would like to try something else than Hydrocodone because she does not drive anymore to be able to pick her rx here every time. Please advise  Pt also requested a med refill for Simvastatin 80mg  to be sent to [facility] .  Rx sent to the pharmacy by e-script.//AB/CMA

## 2014-05-27 MED ORDER — SIMVASTATIN 80 MG PO TABS: 80.0000 mg | ORAL_TABLET | Freq: Every day | ORAL | Status: DC

## 2014-05-27 NOTE — Telephone Encounter (Signed)
All controlled pain meds  need to be pick up. Sorry. I wonder if PCP would allow pt to send somebody else (family member, friend?)

## 2014-05-28 NOTE — Telephone Encounter (Signed)
Spoke with patient and advised. Patient stated that she does not have anyone who can pick it up. States that she thinks she will be ok for now.   Advised to call back if we can help.

## 2014-06-07 ENCOUNTER — Other Ambulatory Visit: Payer: Self-pay | Admitting: Family Medicine

## 2014-06-09 NOTE — Telephone Encounter (Signed)
Last seen and filled 02/24/14 #30 UDS 10/23/12 low risk    Please advise      KP

## 2014-06-09 NOTE — Telephone Encounter (Signed)
Patient aware Rx has been faxed.      KP 

## 2014-06-20 ENCOUNTER — Other Ambulatory Visit: Payer: Self-pay | Admitting: Family Medicine

## 2014-07-07 ENCOUNTER — Other Ambulatory Visit: Payer: Self-pay | Admitting: Family Medicine

## 2014-07-07 NOTE — Telephone Encounter (Signed)
Last seen 02/24/14 and filled 06/09/14 #30.  UDS 10/23/12   Please advise     KP

## 2014-08-01 ENCOUNTER — Telehealth: Payer: Self-pay

## 2014-08-01 MED ORDER — TRAMADOL HCL 50 MG PO TABS: ORAL_TABLET | ORAL | Status: DC

## 2014-08-01 NOTE — Telephone Encounter (Signed)
Ultram Refill; Last seen 02/24/14 and filled 07/07/14 #30. Please advise      KP

## 2014-08-01 NOTE — Telephone Encounter (Signed)
Faxed.   KP 

## 2014-08-01 NOTE — Telephone Encounter (Signed)
Refill x1 

## 2014-08-11 ENCOUNTER — Other Ambulatory Visit: Payer: Self-pay

## 2014-08-11 MED ORDER — FUROSEMIDE 40 MG PO TABS: 40.0000 mg | ORAL_TABLET | Freq: Two times a day (BID) | ORAL | Status: DC

## 2014-09-01 ENCOUNTER — Other Ambulatory Visit: Payer: Self-pay

## 2014-09-01 MED ORDER — SIMVASTATIN 80 MG PO TABS: 80.0000 mg | ORAL_TABLET | Freq: Every day | ORAL | Status: DC

## 2014-09-08 ENCOUNTER — Telehealth: Payer: Self-pay

## 2014-09-08 MED ORDER — TRAMADOL HCL 50 MG PO TABS: ORAL_TABLET | ORAL | Status: DC

## 2014-09-08 NOTE — Telephone Encounter (Signed)
Refill x1 

## 2014-09-08 NOTE — Telephone Encounter (Signed)
Tramadol refill  Last seen 02/24/14 and filled 08/01/14 #30  UDS 10/23/12  Please advise    KP

## 2014-09-08 NOTE — Telephone Encounter (Signed)
Rx faxed.    KP 

## 2014-10-06 ENCOUNTER — Other Ambulatory Visit: Payer: Self-pay | Admitting: Family Medicine

## 2014-10-06 NOTE — Telephone Encounter (Signed)
Last seen 02/24/14 and filled 09/08/14 #30.  Please advise    KP

## 2014-11-06 ENCOUNTER — Telehealth: Payer: Self-pay | Admitting: Family Medicine

## 2014-11-06 NOTE — Telephone Encounter (Signed)
Spoke with patient and she said she had been having some redness and pain in her feet. I advised she needs to be seen. Apt scheduled for 11/10/13     KP

## 2014-11-06 NOTE — Telephone Encounter (Signed)
[name]: Kelsie, Zaborowski Relation to pt: self  Call back number: [phone]  Reason for call:  Pt requesting a referral for podiatrist.

## 2014-11-06 NOTE — Telephone Encounter (Signed)
Per patient the person who cuts her toe nails recommended that she sees a podiatrist. Please advise or advise if patient needs an apt. No Dx.      KP

## 2014-11-06 NOTE — Telephone Encounter (Signed)
We need a diagnosis in order to refer to her to one-- whats wrong with toe nails?  Fungus? Is she does not know , we will need to see her.

## 2014-11-10 ENCOUNTER — Ambulatory Visit (HOSPITAL_BASED_OUTPATIENT_CLINIC_OR_DEPARTMENT_OTHER)
Admission: RE | Admit: 2014-11-10 | Discharge: 2014-11-10 | Disposition: A | Discharge status: Home / Self Care | Payer: Medicare Other | Source: Ambulatory Visit | Attending: Family Medicine | Admitting: Family Medicine

## 2014-11-10 ENCOUNTER — Ambulatory Visit (INDEPENDENT_AMBULATORY_CARE_PROVIDER_SITE_OTHER): Payer: Medicare Other | Admitting: Family Medicine

## 2014-11-10 ENCOUNTER — Encounter: Payer: Self-pay | Admitting: Family Medicine

## 2014-11-10 VITALS — BP 126/80 | HR 68 | Temp 98.5°F | Resp 16 | Wt 296.4 lb

## 2014-11-10 DIAGNOSIS — I10 Essential (primary) hypertension: Secondary | ICD-10-CM | POA: Diagnosis not present

## 2014-11-10 DIAGNOSIS — J984 Other disorders of lung: Secondary | ICD-10-CM | POA: Diagnosis not present

## 2014-11-10 DIAGNOSIS — R059 Cough, unspecified: Secondary | ICD-10-CM

## 2014-11-10 DIAGNOSIS — E785 Hyperlipidemia, unspecified: Secondary | ICD-10-CM

## 2014-11-10 DIAGNOSIS — B351 Tinea unguium: Secondary | ICD-10-CM | POA: Diagnosis not present

## 2014-11-10 DIAGNOSIS — R05 Cough: Secondary | ICD-10-CM | POA: Insufficient documentation

## 2014-11-10 DIAGNOSIS — G894 Chronic pain syndrome: Secondary | ICD-10-CM | POA: Diagnosis not present

## 2014-11-10 LAB — POCT URINALYSIS DIPSTICK
BILIRUBIN UA: NEGATIVE
Blood, UA: NEGATIVE
Glucose, UA: NEGATIVE
Ketones, UA: NEGATIVE
Leukocytes, UA: NEGATIVE
Nitrite, UA: NEGATIVE
Protein, UA: NEGATIVE
Spec Grav, UA: >=1.03
Urobilinogen, UA: NEGATIVE
pH, UA: 6

## 2014-11-10 MED ORDER — HYDROCODONE-ACETAMINOPHEN 5-325 MG PO TABS: 1.0000 | ORAL_TABLET | Freq: Four times a day (QID) | ORAL | Status: DC | PRN

## 2014-11-10 NOTE — Progress Notes (Signed)
Pre visit review using our clinic review tool, if applicable. No additional management support is needed unless otherwise documented below in the visit note. 

## 2014-11-10 NOTE — Patient Instructions (Addendum)
Ringworm, Nail A fungal infection of the nail (tinea unguium/onychomycosis) is common. It is common as the visible part of the nail is composed of dead cells which have no blood supply to help prevent infection. It occurs because fungi are everywhere and will pick any opportunity to grow on any dead material. Because nails are very slow growing they require up to 2 years of treatment with anti-fungal medications. The entire nail back to the base is infected. This includes approximately  of the nail which you cannot see. If your caregiver has prescribed a medication by mouth, take it every day and as directed. No progress will be seen for at least 6 to 9 months. Do not be disappointed! Because fungi live on dead cells with little or no exposure to blood supply, medication delivery to the infection is slow; thus the cure is slow. It is also why you can observe no progress in the first 6 months. The nail becoming cured is the base of the nail, as it has the blood supply. Topical medication such as creams and ointments are usually not effective. Important in successful treatment of nail fungus is closely following the medication regimen that your doctor prescribes. Sometimes you and your caregiver may elect to speed up this process by surgical removal of all the nails. Even this may still require 6 to 9 months of additional oral medications. See your caregiver as directed. Remember there will be no visible improvement for at least 6 months. See your caregiver sooner if other signs of infection (redness and swelling) develop. Document Released: 10/21/2000 Document Revised: 01/16/2012 Document Reviewed: 12/30/2008 [facility] Patient Information 2015 [place], [place]. This information is not intended to replace advice given to you by your health care provider. Make sure you discuss any questions you have with your health care provider.   ---- for cough--- try Mucinex or mucinex DM Also try antihistamine like  ---claritin , zyrtec

## 2014-11-10 NOTE — Progress Notes (Signed)
Subjective:    Patient ID: Norma Ford, female    DOB: Mar 07, 1930, 81 y.o.   MRN: [id number]  HPI Pt here c/o dry, scaley skin and red.  + thick nails Pt would like a podiatry referral.  Pt is also overdue for labs -- cholesterol  Etc.    Past Medical History  Diagnosis Date  . Hyperlipidemia   . HTN (hypertension)   . Cataracts, bilateral   . History of abnormal mammogram     2001-h/o abnormal mammo-told just calcium deposit-repeat  . 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 Kidney stone   . Barrett's esophagus 1999  . THROMBOCYTOPENIA 12/21/2009  . MYCOSIS FUNGOIDES LYMPH NODES MULTIPLE SITES 09/21/2009  . DECUBITUS ULCER, BUTTOCK 12/21/2009   Current Outpatient Prescriptions  Medication Sig Dispense Refill  . furosemide (LASIX) 40 MG tablet Take 1 tablet (40 mg total) by mouth 2 (two) times daily. 180 tablet 0  . HYDROcodone-acetaminophen (NORCO/VICODIN) 5-325 MG per tablet Take 1 tablet by mouth every 6 (six) hours as needed for moderate pain. 90 tablet 0  . metoprolol succinate (TOPROL-XL) 50 MG 24 hr tablet TAKE ONE TABLET BY MOUTH ONCE DAILY 90 tablet 1  . mometasone (NASONEX) 50 MCG/ACT nasal spray Place 2 sprays into the nose daily.      . Multiple Vitamin (MULTIVITAMIN) tablet Take 1 tablet by mouth daily.      . polyethylene glycol powder (GLYCOLAX/MIRALAX) powder Take 17 g by mouth 2 (two) times daily as needed. 3350 g 1  . potassium chloride SA (K-DUR,KLOR-CON) 20 MEQ tablet Take 1 tablet (20 mEq total) by mouth 2 (two) times daily. 180 tablet 1  . sertraline (ZOLOFT) 50 MG tablet TAKE ONE TABLET BY MOUTH ONCE DAILY 30 tablet 5  . simvastatin (ZOCOR) 80 MG tablet Take 1 tablet (80 mg total) by mouth at bedtime. 90 tablet 0  . traMADol (ULTRAM) 50 MG tablet TAKE ONE TABLET BY MOUTH EVERY 8 HOURS AS NEEDED 30 tablet 0  . HYDROcodone-acetaminophen (NORCO) 5-325 MG per tablet Take 1 tablet by mouth every 6 (six) hours as needed for moderate pain. 90 tablet 0    . HYDROcodone-acetaminophen (NORCO) 5-325 MG per tablet Take 1 tablet by mouth every 6 (six) hours as needed for moderate pain. Do not fill until January 09, 2015 90 tablet 0   No current facility-administered medications for this visit.    Review of Systems  Constitutional: Negative for fever, chills, diaphoresis, activity change, appetite change, fatigue and unexpected weight change.  Respiratory: Negative for cough, choking, chest tightness, shortness of breath, wheezing and stridor.   Cardiovascular: Positive for leg swelling. Negative for chest pain and palpitations.  Skin:       Skin red on low ext L -- dry scaly skin with thick yellow toenails.         Objective:   Physical Exam  BP 126/80 mmHg  Pulse 68  Temp(Src) 98.5 F (36.9 C) (Oral)  Resp 16  Wt 296 lb 6.4 oz (134.446 kg)  SpO2 93% General appearance: alert, cooperative, appears stated age, no distress and morbidly obese Neck: no adenopathy, supple, symmetrical, trachea midline and thyroid not enlarged, symmetric, no tenderness/mass/nodules Lungs: clear to auscultation bilaterally Heart: regular rate and rhythm, S1, S2 normal, no murmur, click, rub or gallop Extremities: extremities normal, atraumatic, no cyanosis or edema      Assessment & Plan:  1. Onychomycosis  - Ambulatory referral to Podiatry  2. Chronic  pain syndrome Pt is unable to get here to pick up rx----- 3 rx printed and post dated - HYDROcodone-acetaminophen (NORCO/VICODIN) 5-325 MG per tablet; Take 1 tablet by mouth every 6 (six) hours as needed for moderate pain.  Dispense: 90 tablet; Refill: 0 - HYDROcodone-acetaminophen (NORCO) 5-325 MG per tablet; Take 1 tablet by mouth every 6 (six) hours as needed for moderate pain.  Dispense: 90 tablet; Refill: 0 - HYDROcodone-acetaminophen (NORCO) 5-325 MG per tablet; Take 1 tablet by mouth every 6 (six) hours as needed for moderate pain. Do not fill until January 09, 2015  Dispense: 90 tablet; Refill: 0  3.  Essential hypertension Stable, con't meds-- metoprolol - Basic metabolic panel - Microalbumin / creatinine urine ratio  4. Hyperlipidemia Check labs--- con't zocor - Hepatic function panel - Lipid panel - POCT urinalysis dipstick - Microalbumin / creatinine urine ratio  5. Cough Dry cough, no other symptoms Antihistamine and nasacort otc - DG Chest 2 View; Future

## 2014-11-11 LAB — BASIC METABOLIC PANEL
BUN: 18 mg/dL (ref 6–23)
CHLORIDE: 104 meq/L (ref 96–112)
CO2: 29 mEq/L (ref 19–32)
Calcium: 9.2 mg/dL (ref 8.4–10.5)
Creatinine, Ser: 0.9 mg/dL (ref 0.4–1.2)
GFR: 65.33 mL/min (ref >60.00)
Glucose, Bld: 77 mg/dL (ref 70–99)
Potassium: 4 mEq/L (ref 3.5–5.1)
SODIUM: 140 meq/L (ref 135–145)

## 2014-11-11 LAB — HEPATIC FUNCTION PANEL
ALBUMIN: 3.6 g/dL (ref 3.5–5.2)
ALT: 14 U/L (ref 0–35)
AST: 17 U/L (ref 0–37)
Alkaline Phosphatase: 54 U/L (ref 39–117)
BILIRUBIN TOTAL: 0.3 mg/dL (ref 0.2–1.2)
Bilirubin, Direct: 0.1 mg/dL (ref 0.0–0.3)
TOTAL PROTEIN: 7.4 g/dL (ref 6.0–8.3)

## 2014-11-11 LAB — LIPID PANEL
CHOL/HDL RATIO: 3
Cholesterol: 135 mg/dL (ref 0–200)
HDL: 43.8 mg/dL (ref >39.00)
LDL CALC: 63 mg/dL (ref 0–99)
NONHDL: 91.2
Triglycerides: 142 mg/dL (ref 0.0–149.0)
VLDL: 28.4 mg/dL (ref 0.0–40.0)

## 2014-11-11 LAB — MICROALBUMIN / CREATININE URINE RATIO
Creatinine,U: 58.9 mg/dL
MICROALB UR: 0.7 mg/dL (ref 0.0–1.9)
Microalb Creat Ratio: 1.2 mg/g (ref 0.0–30.0)

## 2014-11-17 ENCOUNTER — Other Ambulatory Visit: Payer: Self-pay

## 2014-11-17 MED ORDER — FUROSEMIDE 40 MG PO TABS: 40.0000 mg | ORAL_TABLET | Freq: Two times a day (BID) | ORAL | Status: DC

## 2014-11-17 MED ORDER — SERTRALINE HCL 50 MG PO TABS: 50.0000 mg | ORAL_TABLET | Freq: Every day | ORAL | Status: DC

## 2014-11-17 MED ORDER — METOPROLOL SUCCINATE ER 50 MG PO TB24: 50.0000 mg | ORAL_TABLET | Freq: Every day | ORAL | Status: DC

## 2014-11-17 MED ORDER — POTASSIUM CHLORIDE CRYS ER 20 MEQ PO TBCR
20.0000 meq | EXTENDED_RELEASE_TABLET | Freq: Two times a day (BID) | ORAL | Status: DC
Start: 2014-11-17 — End: 2015-05-25

## 2014-11-17 MED ORDER — POLYETHYLENE GLYCOL 3350 17 GM/SCOOP PO POWD: 17.0000 g | Freq: Two times a day (BID) | ORAL | Status: DC | PRN

## 2014-11-17 MED ORDER — SIMVASTATIN 80 MG PO TABS: 80.0000 mg | ORAL_TABLET | Freq: Every day | ORAL | Status: DC

## 2014-11-17 NOTE — Telephone Encounter (Signed)
Rx request received from [facility]. I called the patient to confirm the pharmacy and she agreed [facility], she said they told her the med's would be free. Rx sent     KP

## 2014-11-27 ENCOUNTER — Ambulatory Visit (INDEPENDENT_AMBULATORY_CARE_PROVIDER_SITE_OTHER): Payer: Medicare Other | Admitting: Podiatrist

## 2014-11-27 ENCOUNTER — Encounter: Payer: Self-pay | Admitting: Podiatrist

## 2014-11-27 VITALS — BP 126/80 | HR 68 | Resp 16

## 2014-11-27 DIAGNOSIS — B353 Tinea pedis: Secondary | ICD-10-CM

## 2014-11-27 DIAGNOSIS — B351 Tinea unguium: Secondary | ICD-10-CM

## 2014-11-27 DIAGNOSIS — M79676 Pain in unspecified toe(s): Secondary | ICD-10-CM

## 2014-11-27 DIAGNOSIS — I890 Lymphedema, not elsewhere classified: Secondary | ICD-10-CM

## 2014-11-27 MED ORDER — CLOTRIMAZOLE-BETAMETHASONE 1-0.05 % EX CREA: 1.0000 "application " | TOPICAL_CREAM | Freq: Two times a day (BID) | CUTANEOUS | Status: DC

## 2014-11-27 NOTE — Progress Notes (Signed)
   Subjective:    Patient ID: Norma Ford, female    DOB: [birth year], 81 y.o.   MRN: [id number]  HPI Comments: "I have some bad feet"  Patient states that her daughter in law has been trimming her toenails but thought she needed a professional to check them. Her legs are extremely swollen.      Review of Systems  Constitutional: Positive for fatigue.  Musculoskeletal: Positive for myalgias, back pain, arthralgias and gait problem.  Skin: Positive for color change.  All other systems reviewed and are negative.      Objective:   Physical Exam Vascular exam reveals palpable pedal pulses at 1/4 DP and nonpalpable pulses PT. Capillary refill time is immediate. Lower extremity lymphedema changes are noted. Skin changes are also noted to be associated with lower extremity edema. Neurological sensation is intact epicritic clean and protectively. Patient's toenails are elongated, thickened, discolored, yellowish brownish discoloration with subungual debris present 1 through 5 bilateral. There is symptomatic and painful with debridement. She also has evidence of interdigital maceration from the close approximation of the toes due to the swelling. Left foot has some peeling and thinning of skin consistent with fungal presentation. Musculoskeletal examination is intact with dorsiflexion, plantar flexion inversion eversion intact.    Assessment & Plan:  Lymphedema bilateral lower extremities, symptomatic onychomycosis 10  Plan: Debridement of toenails was accomplished today without complication. Also discussed lymphedema therapy and we'll try and get her on a list to get in with [facility] for lymphedema therapy. Wrote a prescription for Lotrisone cream to use on the left foot. She'll be seen back in every 3 months for routine care and follow-up.

## 2014-11-27 NOTE — Patient Instructions (Signed)
We will be referring you to a physical therapist that will help with your swelling.  It will take several months to get you seen but just be patient and we can get you in.

## 2014-12-03 ENCOUNTER — Other Ambulatory Visit: Payer: Self-pay | Admitting: *Deleted

## 2014-12-03 DIAGNOSIS — I890 Lymphedema, not elsewhere classified: Secondary | ICD-10-CM

## 2014-12-15 ENCOUNTER — Telehealth: Payer: Self-pay | Admitting: Family Medicine

## 2014-12-15 NOTE — Telephone Encounter (Signed)
Patient has not been seen since 11/10/14. Would you please Triage her symtpoms?     KP

## 2014-12-15 NOTE — Telephone Encounter (Signed)
Pt continues to have a cough.  States cough is about the same as it was whenever she saw Dr. Etter Sjogren back in January.  States cough is productive. Sputum clear.  States she coughs so hard that she wets her pants.  She denies fever, chest tightness, shortness of breath, sore throat, nasal congestion.    Hx. HTN.  Chest X-ray on 11/10/14 was negative for pneumonia.    Advice: Pt was advise to try Mucinex or Mucinex DM OTC.  If not better, call to schedule an appointment.

## 2014-12-15 NOTE — Telephone Encounter (Signed)
[name]:Alcindor, Khamiyah Grefe Relation to DH:DIXB  Call back number:  [phone] Pharmacy: [facility] [address], [place] [phone])     Reason for call:  Pt states she can not shake cough and would like a cough RX sent to pharmacy. Pt states she would like to pick up in the morning.

## 2014-12-15 NOTE — Telephone Encounter (Signed)
She can try antihistamine too----claritin, or zyrtec

## 2014-12-16 MED ORDER — TRAMADOL HCL 50 MG PO TABS: 50.0000 mg | ORAL_TABLET | Freq: Three times a day (TID) | ORAL | Status: DC | PRN

## 2014-12-16 NOTE — Telephone Encounter (Signed)
Refill x1   1 refill 

## 2014-12-16 NOTE — Addendum Note (Signed)
Addended by: Rudene Anda on: 12/16/2014 11:42 AM   Modules accepted: Orders

## 2014-12-16 NOTE — Telephone Encounter (Signed)
Rx printed and placed in Dr. Lowne's red folder for review and signature. 

## 2014-12-16 NOTE — Telephone Encounter (Signed)
Pt requesting a refill on Tramadol.    Last filled:  10/06/14 Amt: 30, 0  Last OV:  11/10/14 UDS---LOW risk Contract on file.  Please advise.

## 2014-12-16 NOTE — Telephone Encounter (Signed)
Pt notified and made aware.  She was very Patent attorney.

## 2014-12-17 NOTE — Telephone Encounter (Signed)
Rx faxed

## 2015-02-16 ENCOUNTER — Telehealth: Payer: Self-pay

## 2015-02-16 NOTE — Telephone Encounter (Signed)
  Pt requesting a refill on Tramadol.   Last filled: 12/16/14 Amt: 30, with 1 refill Last OV: 11/10/14 UDS---LOW risk Contract on file.   Please advise    KP

## 2015-02-16 NOTE — Telephone Encounter (Signed)
Refill x1   1 refill 

## 2015-02-17 ENCOUNTER — Telehealth: Payer: Self-pay | Admitting: Family Medicine

## 2015-02-17 MED ORDER — TRAMADOL HCL 50 MG PO TABS: 50.0000 mg | ORAL_TABLET | Freq: Three times a day (TID) | ORAL | Status: DC | PRN

## 2015-02-17 NOTE — Telephone Encounter (Signed)
Relation to pt: self  Call back number: [phone] Pharmacy: [facility] [address], [place] [phone] ([phone] (Fax)         Reason for call:  Pt states as per pharmacy did not receive traMADol (ULTRAM) 50 MG tablet. Advised pt was sent today make take a few hours. Please advise

## 2015-02-17 NOTE — Telephone Encounter (Signed)
Rx re-faxed    KP 

## 2015-02-17 NOTE — Telephone Encounter (Signed)
Rx faxed.    KP 

## 2015-02-17 NOTE — Telephone Encounter (Signed)
[name]:Vetta Arganbright Relationship to patient:self Can be reached: [phone] Pharmacy: [facility] [place], [place]   Reason for call: PT called [facility] and they stated RX has not been called in- showing called in as of 4/12-

## 2015-03-05 ENCOUNTER — Ambulatory Visit: Payer: Medicare Other

## 2015-03-19 ENCOUNTER — Telehealth: Payer: Self-pay | Admitting: Family Medicine

## 2015-03-19 DIAGNOSIS — G894 Chronic pain syndrome: Secondary | ICD-10-CM

## 2015-03-19 MED ORDER — HYDROCODONE-ACETAMINOPHEN 5-325 MG PO TABS: 1.0000 | ORAL_TABLET | Freq: Four times a day (QID) | ORAL | Status: DC | PRN

## 2015-03-19 NOTE — Telephone Encounter (Signed)
Last seen 11/10/14 and filled 01/09/15 #90 UDS 10/23/13 low risk    Please advise      KP

## 2015-03-19 NOTE — Telephone Encounter (Signed)
Relation to pt: self  Call back number: [phone]   Reason for call:   Pt spouse died 02-07-2023 17-Mar-2015 the funeral is 03/21/15.pt requesting HYDROcodone-acetaminophen (NORCO/VICODIN) 5-325 MG per tablet to get thru. Pt states her spouse was her driver and requesting RX sent to retail pharmacy. Pt states she has a son that lives in [place] will contact him to see if he can pick up RX. Please advise

## 2015-03-19 NOTE — Telephone Encounter (Signed)
Very sorry for her loss Refill x1

## 2015-03-19 NOTE — Telephone Encounter (Signed)
Patient aware and verbalized understanding.      KP

## 2015-04-14 ENCOUNTER — Other Ambulatory Visit: Payer: Self-pay | Admitting: Podiatrist

## 2015-04-14 ENCOUNTER — Other Ambulatory Visit: Payer: Self-pay | Admitting: Family Medicine

## 2015-04-14 NOTE — Telephone Encounter (Signed)
Pt must have an appt if problem persist.

## 2015-04-14 NOTE — Telephone Encounter (Signed)
Patient is requesting prescription refill for Tramadol. Last office visit 11/27/14. Prescription last filled 02/17/15, quantity 30. Please advise. RB

## 2015-04-17 ENCOUNTER — Encounter: Payer: Self-pay | Admitting: Family Medicine

## 2015-04-17 ENCOUNTER — Ambulatory Visit (INDEPENDENT_AMBULATORY_CARE_PROVIDER_SITE_OTHER): Payer: Medicare Other | Admitting: Family Medicine

## 2015-04-17 VITALS — BP 118/80 | HR 62 | Ht 62.0 in | Wt 287.2 lb

## 2015-04-17 DIAGNOSIS — G894 Chronic pain syndrome: Secondary | ICD-10-CM

## 2015-04-17 DIAGNOSIS — M19011 Primary osteoarthritis, right shoulder: Secondary | ICD-10-CM | POA: Diagnosis not present

## 2015-04-17 DIAGNOSIS — M19012 Primary osteoarthritis, left shoulder: Secondary | ICD-10-CM

## 2015-04-17 DIAGNOSIS — R82998 Other abnormal findings in urine: Secondary | ICD-10-CM

## 2015-04-17 DIAGNOSIS — M170 Bilateral primary osteoarthritis of knee: Secondary | ICD-10-CM | POA: Diagnosis not present

## 2015-04-17 DIAGNOSIS — N390 Urinary tract infection, site not specified: Secondary | ICD-10-CM

## 2015-04-17 DIAGNOSIS — R350 Frequency of micturition: Secondary | ICD-10-CM | POA: Diagnosis not present

## 2015-04-17 LAB — POCT URINALYSIS DIPSTICK
Bilirubin, UA: NEGATIVE
Blood, UA: NEGATIVE
Glucose, UA: NEGATIVE
KETONES UA: NEGATIVE
Nitrite, UA: NEGATIVE
Spec Grav, UA: >=1.03
Urobilinogen, UA: 4
pH, UA: 6

## 2015-04-17 MED ORDER — NONFORMULARY OR COMPOUNDED ITEM
Status: DC
Start: 2015-04-17 — End: 2015-07-07

## 2015-04-17 MED ORDER — HYDROCODONE-ACETAMINOPHEN 5-325 MG PO TABS: 1.0000 | ORAL_TABLET | Freq: Four times a day (QID) | ORAL | Status: DC | PRN

## 2015-04-17 NOTE — Progress Notes (Addendum)
Patient ID: Norma Ford, female    DOB: [birth year]  Age: 81 y.o. MRN: [id number]    Subjective:  Subjective HPI Norma Ford presents for f/u-- her husband recently died suddenly.  Pt has her son and daughter in law living with her and helping-- they will be moving into a condo july1 and would home heath come in I am seeing the patient today for powerwheel chair and pt evaluation.  She is unable to use a scooter---it is too big and has handle bars -- it will not fit in her home.  The power chair is smaller and has a joystick.  Her pain level in usually 10/10.    Review of Systems  Constitutional: Positive for fatigue. Negative for activity change, appetite change and unexpected weight change.  Respiratory: Negative for cough and shortness of breath.   Cardiovascular: Positive for leg swelling. Negative for chest pain and palpitations.  Neurological: Positive for weakness. Negative for tremors.  Psychiatric/Behavioral: Positive for dysphoric mood. Negative for behavioral problems and sleep disturbance. The patient is not nervous/anxious.     History Past Medical History  Diagnosis Date  . Hyperlipidemia   . HTN (hypertension)   . Cataracts, bilateral   . History of abnormal mammogram     2001-h/o abnormal mammo-told just calcium deposit-repeat  . 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 Kidney stone   . Barrett's esophagus 1999  . THROMBOCYTOPENIA 12/21/2009  . MYCOSIS FUNGOIDES LYMPH NODES MULTIPLE SITES 09/21/2009  . DECUBITUS ULCER, BUTTOCK 12/21/2009    She has past surgical history that includes Total knee arthroplasty; Esophagogastroduodenoscopy (03/2002); tubal ligation; Abdominal hysterectomy; Tonsillectomy; and appendectomy.   Her family history includes Heart disease in her father and mother; Kidney disease in her father.She reports that she has never smoked. She has never used smokeless tobacco. Her alcohol and drug histories are not on file.  Current  Outpatient Prescriptions on File Prior to Visit  Medication Sig Dispense Refill  . clotrimazole-betamethasone (LOTRISONE) cream APPLY ONE APPLICATION TOPICALLY TWO TIMES DAILY 45 g 0  . furosemide (LASIX) 40 MG tablet Take 1 tablet (40 mg total) by mouth 2 (two) times daily. 180 tablet 1  . metoprolol succinate (TOPROL-XL) 50 MG 24 hr tablet Take 1 tablet (50 mg total) by mouth daily. Take with or immediately following a meal. 90 tablet 1  . mometasone (NASONEX) 50 MCG/ACT nasal spray Place 2 sprays into the nose daily.      . Multiple Vitamin (MULTIVITAMIN) tablet Take 1 tablet by mouth daily.      . polyethylene glycol powder (GLYCOLAX/MIRALAX) powder Take 17 g by mouth 2 (two) times daily as needed. 3350 g 1  . potassium chloride SA (K-DUR,KLOR-CON) 20 MEQ tablet Take 1 tablet (20 mEq total) by mouth 2 (two) times daily. 180 tablet 1  . sertraline (ZOLOFT) 50 MG tablet Take 1 tablet (50 mg total) by mouth daily. 90 tablet 1  . simvastatin (ZOCOR) 80 MG tablet Take 1 tablet (80 mg total) by mouth at bedtime. 90 tablet 1  . traMADol (ULTRAM) 50 MG tablet TAKE ONE TABLET BY MOUTH EVERY 8 HOURS AS NEEDED 30 tablet 0   No current facility-administered medications on file prior to visit.     Objective:  Objective Physical Exam  Constitutional: She is oriented to person, place, and time. She appears well-developed and well-nourished.  HENT:  Head: Normocephalic and atraumatic.  Eyes: Conjunctivae and EOM are normal.  Neck: Normal range  of motion. Neck supple. No JVD present. Carotid bruit is not present. No thyromegaly present.  Cardiovascular: Normal rate, regular rhythm and normal heart sounds.   No murmur heard. Pulmonary/Chest: Effort normal and breath sounds normal. No respiratory distress. She has no wheezes. She has no rales. She exhibits no tenderness.  Musculoskeletal: She exhibits edema and tenderness.  Neurological: She is alert and oriented to person, place, and time. Coordination  and gait abnormal.  Pt walking with walker but very weak  < 3/5 in all ext  Upper and lower  Psychiatric: She has a normal mood and affect. Her behavior is normal.   BP 118/80 mmHg  Pulse 62  Temp(Src)   Ht 5\' 2"  (1.575 m)  Wt 287 lb 3.2 oz (130.273 kg)  BMI 52.52 kg/m2  SpO2 94% Wt Readings from Last 3 Encounters:  04/17/15 287 lb 3.2 oz (130.273 kg)  11/10/14 296 lb 6.4 oz (134.446 kg)  02/24/14 277 lb (125.646 kg)     Lab Results  Component Value Date   WBC 5.9 02/24/2014   HGB 13.6 02/24/2014   HCT 41.3 02/24/2014   PLT 172.0 02/24/2014   GLUCOSE 77 11/10/2014   CHOL 135 11/10/2014   TRIG 142.0 11/10/2014   HDL 43.80 11/10/2014   LDLCALC 63 11/10/2014   ALT 14 11/10/2014   AST 17 11/10/2014   NA 140 11/10/2014   K 4.0 11/10/2014   CL 104 11/10/2014   CREATININE 0.9 11/10/2014   BUN 18 11/10/2014   CO2 29 11/10/2014   TSH 1.75 09/21/2009   MICROALBUR 0.7 11/10/2014    Dg Chest 2 View  11/10/2014   CLINICAL DATA:  One-month history of cough  EXAM: CHEST  2 VIEW  COMPARISON:  PA and lateral chest x-ray of October 05, 2011  FINDINGS: The lungs are slightly less well inflated today. There is stable scarring at both lung bases. There is no pleural effusion or alveolar infiltrate. The cardiac silhouette remains mildly enlarged. The pulmonary vascularity is not engorged. The trachea is midline. There is degenerative change of both shoulders.  IMPRESSION: There is no active cardiopulmonary disease. There is bibasilar scarring.   Electronically Signed   By: David  Martinique   On: 11/10/2014 16:29     Assessment & Plan:  Plan I am having [name] start on NONFORMULARY OR COMPOUNDED ITEM. I am also having her maintain her multivitamin, mometasone, furosemide, metoprolol succinate, polyethylene glycol powder, potassium chloride SA, sertraline, simvastatin, clotrimazole-betamethasone, traMADol, and HYDROcodone-acetaminophen.  Meds ordered this encounter  Medications  .  NONFORMULARY OR COMPOUNDED ITEM    Sig: Power chair [id number] compass HD medicare code [phone] Diagnosis severe osteoarthritis, urinary incontinence , chronic pain edema    Dispense:  1 each    Refill:  0  . HYDROcodone-acetaminophen (NORCO/VICODIN) 5-325 MG per tablet    Sig: Take 1 tablet by mouth every 6 (six) hours as needed for moderate pain.    Dispense:  90 tablet    Refill:  0    Problem List Items Addressed This Visit    Osteoarthritis of both knees    Pt is walking with a walker with a lot of pain and is unable to do any adls without a lot of assistanc. She needs the power chair for use in the home mostly but would be used outside as well. She is unable to use a manual wheelchair due to weak upper ext      Relevant Medications   NONFORMULARY OR COMPOUNDED  ITEM   HYDROcodone-acetaminophen (NORCO/VICODIN) 5-325 MG per tablet   Other Relevant Orders   Ambulatory referral to [place]    Other Visit Diagnoses    Primary osteoarthritis of both shoulders    -  Primary    Relevant Medications    NONFORMULARY OR COMPOUNDED ITEM    HYDROcodone-acetaminophen (NORCO/VICODIN) 5-325 MG per tablet    Other Relevant Orders    Ambulatory referral to [place]    Urinary frequency        Relevant Orders    POCT urinalysis dipstick (Completed)    Chronic pain syndrome        Relevant Medications    HYDROcodone-acetaminophen (NORCO/VICODIN) 5-325 MG per tablet    Leukocytes in urine        Relevant Orders    Urine Culture (Completed)       Follow-up: Return in about 6 months (around 10/17/2015), or if symptoms worsen or fail to improve, for f/u.  Garnet Koyanagi, DO

## 2015-04-17 NOTE — Progress Notes (Signed)
Pre visit review using our clinic review tool, if applicable. No additional management support is needed unless otherwise documented below in the visit note. 

## 2015-04-17 NOTE — Patient Instructions (Signed)
Grief Reaction Grief is a normal response to the death of someone close to you. Feelings of fear, anger, and guilt can affect almost everyone who loses someone they love. Symptoms of depression are also common. These include problems with sleep, loss of appetite, and lack of energy. These grief reaction symptoms often last for weeks to months after a loss. They may also return during special times that remind you of the person you lost, such as an anniversary or birthday. Anxiety, insomnia, irritability, and deep depression may last beyond the period of normal grief. If you experience these feelings for 6 months or longer, you may have clinical depression. Clinical depression requires further medical attention. If you think that you have clinical depression, you should contact your caregiver. If you have a history of depression or a family history of depression, you are at greater risk of clinical depression. You are also at greater risk of developing clinical depression if the loss was traumatic or the loss was of someone with whom you had unresolved issues.  A grief reaction can become complicated by being blocked. This means being unable to cry or express extreme emotions. This may prolong the grieving period and worsen the emotional effects of the loss. Mourning is a natural event in human life. A healthy grief reaction is one that is not blocked. It requires a time of sadness and readjustment. It is very important to share your sorrow and fear with others, especially close friends and family. Professional counselors and clergy can also help you process your grief. Document Released: 10/24/2005 Document Revised: 03/10/2014 Document Reviewed: 07/04/2006 [facility] Patient Information 2015 [place], [place]. This information is not intended to replace advice given to you by your health care provider. Make sure you discuss any questions you have with your health care provider.

## 2015-04-20 LAB — URINE CULTURE

## 2015-04-22 ENCOUNTER — Telehealth: Payer: Self-pay | Admitting: Family Medicine

## 2015-04-22 ENCOUNTER — Other Ambulatory Visit: Payer: Self-pay | Admitting: General Practice

## 2015-04-22 MED ORDER — SULFAMETHOXAZOLE-TRIMETHOPRIM 800-160 MG PO TABS: 1.0000 | ORAL_TABLET | Freq: Two times a day (BID) | ORAL | Status: DC

## 2015-04-22 NOTE — Telephone Encounter (Signed)
Norma Ford with [facility] called and would like additional orders for physical therapy.  The order was just occupational therapy.  She needs to hear that both are ok

## 2015-04-22 NOTE — Telephone Encounter (Signed)
Spoke with Zigmund Daniel and gave verbal authorization to proceed with physical therapy as well.

## 2015-05-05 ENCOUNTER — Telehealth: Payer: Self-pay | Admitting: Family Medicine

## 2015-05-05 DIAGNOSIS — M170 Bilateral primary osteoarthritis of knee: Secondary | ICD-10-CM

## 2015-05-05 NOTE — Telephone Encounter (Signed)
Called [employer] and initiated PA for power wheelchair. The auth # is [id number]. The rep stated once the authorization is assigned to a case manager, we will receive a phone call or fax requesting medical documentation. JG//CMA

## 2015-05-05 NOTE — Telephone Encounter (Signed)
ok 

## 2015-05-05 NOTE — Telephone Encounter (Signed)
I called [employer] and spoke with a prior auth rep. They are requiring the CPT code associated with the wheelchair. Do you know what this code would be? Please advise. JG//CMA

## 2015-05-05 NOTE — Telephone Encounter (Signed)
[name]:  Modena Nunnery from [facility]  Relation to pt: Call back number:  [phone] Pharmacy:  Reason for call:   States that the patient spoke to Dr. Etter Sjogren and had requested a powered wheelchair that is requiring prior authorization. Lea states that we need to send info to [facility]

## 2015-05-13 NOTE — Telephone Encounter (Signed)
Received fax from [facility] that requires certain information to be added to the most recent face-to-face encounter. This fax was forwarded to Dr. Etter Sjogren. JG//CMA

## 2015-05-15 DIAGNOSIS — M170 Bilateral primary osteoarthritis of knee: Secondary | ICD-10-CM | POA: Insufficient documentation

## 2015-05-15 NOTE — Assessment & Plan Note (Addendum)
Pt is walking with a walker with a lot of pain and is unable to do any adls without a lot of assistanc. She needs the power chair for use in the home mostly but would be used outside as well. She is unable to use a manual wheelchair due to weak upper ext Weakness and pain worsening and using the walker is unsafe.  Pt is high risk for falling.

## 2015-05-19 DIAGNOSIS — R32 Unspecified urinary incontinence: Secondary | ICD-10-CM | POA: Diagnosis not present

## 2015-05-19 DIAGNOSIS — M19011 Primary osteoarthritis, right shoulder: Secondary | ICD-10-CM | POA: Diagnosis not present

## 2015-05-19 DIAGNOSIS — I10 Essential (primary) hypertension: Secondary | ICD-10-CM | POA: Diagnosis not present

## 2015-05-19 DIAGNOSIS — M170 Bilateral primary osteoarthritis of knee: Secondary | ICD-10-CM | POA: Diagnosis not present

## 2015-05-19 DIAGNOSIS — M6281 Muscle weakness (generalized): Secondary | ICD-10-CM | POA: Diagnosis not present

## 2015-05-19 DIAGNOSIS — M19012 Primary osteoarthritis, left shoulder: Secondary | ICD-10-CM | POA: Diagnosis not present

## 2015-05-19 DIAGNOSIS — F339 Major depressive disorder, recurrent, unspecified: Secondary | ICD-10-CM | POA: Diagnosis not present

## 2015-05-19 DIAGNOSIS — G894 Chronic pain syndrome: Secondary | ICD-10-CM | POA: Diagnosis not present

## 2015-05-19 DIAGNOSIS — E669 Obesity, unspecified: Secondary | ICD-10-CM | POA: Diagnosis not present

## 2015-05-20 ENCOUNTER — Telehealth: Payer: Self-pay | Admitting: Family Medicine

## 2015-05-20 DIAGNOSIS — G894 Chronic pain syndrome: Secondary | ICD-10-CM

## 2015-05-20 NOTE — Telephone Encounter (Signed)
[name]: Jaidee Stipe Relationship to patient: self Can be reached: [phone]  Reason for call: Pt needs refill on hydrocodone. She said that her daughter in law will pick it up, Glade Lloyd. Pt has 3 days of meds left. Taking 1/day. Please call when RX is ready.  Pt said that she really appreciates Dr. Etter Sjogren having the lady come out to the house and she was there 2 1/2 hours yesterday. She hopes she'll be able to walk again but either way she is glad to have the help.

## 2015-05-21 ENCOUNTER — Telehealth: Payer: Self-pay | Admitting: Family Medicine

## 2015-05-21 MED ORDER — HYDROCODONE-ACETAMINOPHEN 5-325 MG PO TABS: 1.0000 | ORAL_TABLET | Freq: Four times a day (QID) | ORAL | Status: DC | PRN

## 2015-05-21 NOTE — Telephone Encounter (Signed)
[name]: Mechele Claude  Relation to pt: Physical Therapist from [place]  Call back number: [phone] Pharmacy:  Reason for call:  Requesting verbal orders 3x a week for 8 weeks. Service care date 05/20/15 was the earliest patient allowed [facility] to visit

## 2015-05-21 NOTE — Telephone Encounter (Signed)
Last seen and filled 04/17/15 #90   Please advise    KP

## 2015-05-21 NOTE — Telephone Encounter (Signed)
Patient aware that Rx is ready for pick up.     KP

## 2015-05-21 NOTE — Telephone Encounter (Signed)
Verbal left on the VM.     KP

## 2015-05-21 NOTE — Telephone Encounter (Signed)
Refill x1 

## 2015-05-22 NOTE — Telephone Encounter (Signed)
Patient states that Norma Ford will be picking this rx up.

## 2015-05-25 ENCOUNTER — Other Ambulatory Visit: Payer: Self-pay | Admitting: Family Medicine

## 2015-05-25 NOTE — Telephone Encounter (Signed)
Received phone call from [facility] stating that this request was cancelled due to someone from [facility] calling the wrong office and having that office tell them that Norma Ford is not a pt of theirs. The rep from [facility] stated the entire process will need to be started over from the beginning and that necessary paperwork/instructions will be faxed to [place]. I verified that they had the right contact info for our office. JG//CMA

## 2015-05-26 ENCOUNTER — Telehealth: Payer: Self-pay

## 2015-05-26 ENCOUNTER — Telehealth: Payer: Self-pay | Admitting: Family Medicine

## 2015-05-26 DIAGNOSIS — E669 Obesity, unspecified: Secondary | ICD-10-CM | POA: Diagnosis not present

## 2015-05-26 DIAGNOSIS — G894 Chronic pain syndrome: Secondary | ICD-10-CM | POA: Diagnosis not present

## 2015-05-26 DIAGNOSIS — M170 Bilateral primary osteoarthritis of knee: Secondary | ICD-10-CM | POA: Diagnosis not present

## 2015-05-26 DIAGNOSIS — I10 Essential (primary) hypertension: Secondary | ICD-10-CM | POA: Diagnosis not present

## 2015-05-26 DIAGNOSIS — M19012 Primary osteoarthritis, left shoulder: Secondary | ICD-10-CM | POA: Diagnosis not present

## 2015-05-26 DIAGNOSIS — R32 Unspecified urinary incontinence: Secondary | ICD-10-CM | POA: Diagnosis not present

## 2015-05-26 DIAGNOSIS — M19011 Primary osteoarthritis, right shoulder: Secondary | ICD-10-CM | POA: Diagnosis not present

## 2015-05-26 DIAGNOSIS — F339 Major depressive disorder, recurrent, unspecified: Secondary | ICD-10-CM | POA: Diagnosis not present

## 2015-05-26 DIAGNOSIS — M6281 Muscle weakness (generalized): Secondary | ICD-10-CM | POA: Diagnosis not present

## 2015-05-26 MED ORDER — TRAMADOL HCL 50 MG PO TABS: 50.0000 mg | ORAL_TABLET | Freq: Three times a day (TID) | ORAL | Status: DC | PRN

## 2015-05-26 MED ORDER — COMMODE BEDSIDE MISC: Status: DC

## 2015-05-26 NOTE — Telephone Encounter (Signed)
Tramadol  Last seen 04/17/15 and filled 04/14/15 #30  Please advise     KP

## 2015-05-26 NOTE — Telephone Encounter (Signed)
Refill x1   1 refill

## 2015-05-26 NOTE — Telephone Encounter (Signed)
Verbal order given to [facility] and order faxed to [facility] for Commode.      KP

## 2015-05-26 NOTE — Telephone Encounter (Signed)
Rx faxed

## 2015-05-26 NOTE — Telephone Encounter (Signed)
[name]:malory Relationship to patient: [facility] Can be reached: Pharmacy:  Reason for call: wants a verbal order for occupational therapy 1 time a week for 2 weeks then 2 times a week for 4 weeks  She wants an order for a bariatric bedside commode faxed to [phone] [employer] [facility]

## 2015-05-28 DIAGNOSIS — E669 Obesity, unspecified: Secondary | ICD-10-CM | POA: Diagnosis not present

## 2015-05-28 DIAGNOSIS — F339 Major depressive disorder, recurrent, unspecified: Secondary | ICD-10-CM | POA: Diagnosis not present

## 2015-05-28 DIAGNOSIS — G894 Chronic pain syndrome: Secondary | ICD-10-CM | POA: Diagnosis not present

## 2015-05-28 DIAGNOSIS — I10 Essential (primary) hypertension: Secondary | ICD-10-CM | POA: Diagnosis not present

## 2015-05-28 DIAGNOSIS — M19012 Primary osteoarthritis, left shoulder: Secondary | ICD-10-CM | POA: Diagnosis not present

## 2015-05-28 DIAGNOSIS — R32 Unspecified urinary incontinence: Secondary | ICD-10-CM | POA: Diagnosis not present

## 2015-05-28 DIAGNOSIS — M6281 Muscle weakness (generalized): Secondary | ICD-10-CM | POA: Diagnosis not present

## 2015-05-28 DIAGNOSIS — M170 Bilateral primary osteoarthritis of knee: Secondary | ICD-10-CM | POA: Diagnosis not present

## 2015-05-28 DIAGNOSIS — M19011 Primary osteoarthritis, right shoulder: Secondary | ICD-10-CM | POA: Diagnosis not present

## 2015-05-28 NOTE — Telephone Encounter (Signed)
OV notes and script faxed to [facility] successfully. AttnMannie Stabile at [phone]. JG//CMA

## 2015-05-29 ENCOUNTER — Other Ambulatory Visit: Payer: Self-pay | Admitting: Family Medicine

## 2015-05-29 DIAGNOSIS — I10 Essential (primary) hypertension: Secondary | ICD-10-CM | POA: Diagnosis not present

## 2015-05-29 DIAGNOSIS — M19012 Primary osteoarthritis, left shoulder: Secondary | ICD-10-CM | POA: Diagnosis not present

## 2015-05-29 DIAGNOSIS — G894 Chronic pain syndrome: Secondary | ICD-10-CM | POA: Diagnosis not present

## 2015-05-29 DIAGNOSIS — E669 Obesity, unspecified: Secondary | ICD-10-CM | POA: Diagnosis not present

## 2015-05-29 DIAGNOSIS — M19011 Primary osteoarthritis, right shoulder: Secondary | ICD-10-CM | POA: Diagnosis not present

## 2015-05-29 DIAGNOSIS — F339 Major depressive disorder, recurrent, unspecified: Secondary | ICD-10-CM | POA: Diagnosis not present

## 2015-05-29 DIAGNOSIS — M170 Bilateral primary osteoarthritis of knee: Secondary | ICD-10-CM | POA: Diagnosis not present

## 2015-05-29 DIAGNOSIS — R32 Unspecified urinary incontinence: Secondary | ICD-10-CM | POA: Diagnosis not present

## 2015-05-29 DIAGNOSIS — M6281 Muscle weakness (generalized): Secondary | ICD-10-CM | POA: Diagnosis not present

## 2015-06-01 ENCOUNTER — Other Ambulatory Visit: Payer: Self-pay | Admitting: Family Medicine

## 2015-06-01 DIAGNOSIS — R32 Unspecified urinary incontinence: Secondary | ICD-10-CM | POA: Diagnosis not present

## 2015-06-01 DIAGNOSIS — G894 Chronic pain syndrome: Secondary | ICD-10-CM | POA: Diagnosis not present

## 2015-06-01 DIAGNOSIS — E669 Obesity, unspecified: Secondary | ICD-10-CM | POA: Diagnosis not present

## 2015-06-01 DIAGNOSIS — M19012 Primary osteoarthritis, left shoulder: Secondary | ICD-10-CM | POA: Diagnosis not present

## 2015-06-01 DIAGNOSIS — M6281 Muscle weakness (generalized): Secondary | ICD-10-CM | POA: Diagnosis not present

## 2015-06-01 DIAGNOSIS — F339 Major depressive disorder, recurrent, unspecified: Secondary | ICD-10-CM | POA: Diagnosis not present

## 2015-06-01 DIAGNOSIS — M170 Bilateral primary osteoarthritis of knee: Secondary | ICD-10-CM | POA: Diagnosis not present

## 2015-06-01 DIAGNOSIS — M19011 Primary osteoarthritis, right shoulder: Secondary | ICD-10-CM | POA: Diagnosis not present

## 2015-06-01 DIAGNOSIS — I10 Essential (primary) hypertension: Secondary | ICD-10-CM | POA: Diagnosis not present

## 2015-06-03 DIAGNOSIS — G894 Chronic pain syndrome: Secondary | ICD-10-CM | POA: Diagnosis not present

## 2015-06-03 DIAGNOSIS — M19011 Primary osteoarthritis, right shoulder: Secondary | ICD-10-CM | POA: Diagnosis not present

## 2015-06-03 DIAGNOSIS — I10 Essential (primary) hypertension: Secondary | ICD-10-CM | POA: Diagnosis not present

## 2015-06-03 DIAGNOSIS — F339 Major depressive disorder, recurrent, unspecified: Secondary | ICD-10-CM | POA: Diagnosis not present

## 2015-06-03 DIAGNOSIS — M6281 Muscle weakness (generalized): Secondary | ICD-10-CM | POA: Diagnosis not present

## 2015-06-03 DIAGNOSIS — E669 Obesity, unspecified: Secondary | ICD-10-CM | POA: Diagnosis not present

## 2015-06-03 DIAGNOSIS — M19012 Primary osteoarthritis, left shoulder: Secondary | ICD-10-CM | POA: Diagnosis not present

## 2015-06-03 DIAGNOSIS — R32 Unspecified urinary incontinence: Secondary | ICD-10-CM | POA: Diagnosis not present

## 2015-06-03 DIAGNOSIS — M170 Bilateral primary osteoarthritis of knee: Secondary | ICD-10-CM | POA: Diagnosis not present

## 2015-06-05 MED ORDER — TRAMADOL HCL 50 MG PO TABS: 50.0000 mg | ORAL_TABLET | Freq: Every day | ORAL | Status: DC

## 2015-06-05 NOTE — Telephone Encounter (Signed)
To MD to approve #90 instead of #30.       KP

## 2015-06-05 NOTE — Telephone Encounter (Addendum)
Relation to pt: self  Call back number: [phone] Pharmacy: [facility]  Reason for call:  Patient called to say thank you for sending in RX, medication received 06/04/15 patient was billed $2.78 patient was advised by [facility] -if RX was sent in for 90 day supply it would be free. Patient would like you to call  Mail order [phone]

## 2015-06-05 NOTE — Telephone Encounter (Signed)
Rx faxed and the patient has been made aware.     KP 

## 2015-06-05 NOTE — Addendum Note (Signed)
Addended by: Ewing Schlein on: 06/05/2015 02:00 PM   Modules accepted: Orders

## 2015-06-05 NOTE — Telephone Encounter (Signed)
Ok to do 90

## 2015-06-08 DIAGNOSIS — R32 Unspecified urinary incontinence: Secondary | ICD-10-CM | POA: Diagnosis not present

## 2015-06-08 DIAGNOSIS — I10 Essential (primary) hypertension: Secondary | ICD-10-CM | POA: Diagnosis not present

## 2015-06-08 DIAGNOSIS — M19011 Primary osteoarthritis, right shoulder: Secondary | ICD-10-CM | POA: Diagnosis not present

## 2015-06-08 DIAGNOSIS — F339 Major depressive disorder, recurrent, unspecified: Secondary | ICD-10-CM | POA: Diagnosis not present

## 2015-06-08 DIAGNOSIS — M6281 Muscle weakness (generalized): Secondary | ICD-10-CM | POA: Diagnosis not present

## 2015-06-08 DIAGNOSIS — E669 Obesity, unspecified: Secondary | ICD-10-CM | POA: Diagnosis not present

## 2015-06-08 DIAGNOSIS — G894 Chronic pain syndrome: Secondary | ICD-10-CM | POA: Diagnosis not present

## 2015-06-08 DIAGNOSIS — M170 Bilateral primary osteoarthritis of knee: Secondary | ICD-10-CM | POA: Diagnosis not present

## 2015-06-08 DIAGNOSIS — M19012 Primary osteoarthritis, left shoulder: Secondary | ICD-10-CM | POA: Diagnosis not present

## 2015-06-10 DIAGNOSIS — M170 Bilateral primary osteoarthritis of knee: Secondary | ICD-10-CM | POA: Diagnosis not present

## 2015-06-10 DIAGNOSIS — F339 Major depressive disorder, recurrent, unspecified: Secondary | ICD-10-CM | POA: Diagnosis not present

## 2015-06-10 DIAGNOSIS — M19012 Primary osteoarthritis, left shoulder: Secondary | ICD-10-CM | POA: Diagnosis not present

## 2015-06-10 DIAGNOSIS — R32 Unspecified urinary incontinence: Secondary | ICD-10-CM | POA: Diagnosis not present

## 2015-06-10 DIAGNOSIS — M19011 Primary osteoarthritis, right shoulder: Secondary | ICD-10-CM | POA: Diagnosis not present

## 2015-06-10 DIAGNOSIS — M6281 Muscle weakness (generalized): Secondary | ICD-10-CM | POA: Diagnosis not present

## 2015-06-10 DIAGNOSIS — E669 Obesity, unspecified: Secondary | ICD-10-CM | POA: Diagnosis not present

## 2015-06-10 DIAGNOSIS — I10 Essential (primary) hypertension: Secondary | ICD-10-CM | POA: Diagnosis not present

## 2015-06-10 DIAGNOSIS — G894 Chronic pain syndrome: Secondary | ICD-10-CM | POA: Diagnosis not present

## 2015-06-11 ENCOUNTER — Telehealth: Payer: Self-pay | Admitting: Family Medicine

## 2015-06-11 NOTE — Telephone Encounter (Signed)
[name]:[facility]allory-[facility] Relation to pt:OT Call back number:[phone] Pharmacy:  Reason for call: needing verbal order for a home nurse for foot care until she has been evaluated.

## 2015-06-11 NOTE — Telephone Encounter (Signed)
Verbal given for the requested services.       KP

## 2015-06-12 ENCOUNTER — Telehealth: Payer: Self-pay | Admitting: Family Medicine

## 2015-06-12 DIAGNOSIS — R32 Unspecified urinary incontinence: Secondary | ICD-10-CM | POA: Diagnosis not present

## 2015-06-12 DIAGNOSIS — M170 Bilateral primary osteoarthritis of knee: Secondary | ICD-10-CM | POA: Diagnosis not present

## 2015-06-12 DIAGNOSIS — F339 Major depressive disorder, recurrent, unspecified: Secondary | ICD-10-CM | POA: Diagnosis not present

## 2015-06-12 DIAGNOSIS — M19011 Primary osteoarthritis, right shoulder: Secondary | ICD-10-CM | POA: Diagnosis not present

## 2015-06-12 DIAGNOSIS — G894 Chronic pain syndrome: Secondary | ICD-10-CM | POA: Diagnosis not present

## 2015-06-12 DIAGNOSIS — E669 Obesity, unspecified: Secondary | ICD-10-CM | POA: Diagnosis not present

## 2015-06-12 DIAGNOSIS — I10 Essential (primary) hypertension: Secondary | ICD-10-CM | POA: Diagnosis not present

## 2015-06-12 DIAGNOSIS — M19012 Primary osteoarthritis, left shoulder: Secondary | ICD-10-CM | POA: Diagnosis not present

## 2015-06-12 DIAGNOSIS — M6281 Muscle weakness (generalized): Secondary | ICD-10-CM | POA: Diagnosis not present

## 2015-06-12 NOTE — Telephone Encounter (Signed)
[name]: Malorie (occupational therapist)  Relationship to patient: Can be reached:[phone] Pharmacy:  Reason for call: Called to inform you of the pt's vitals.    Pulse: 52    BP: 150/88   No call back is needed unless you have further questions for her.

## 2015-06-12 NOTE — Telephone Encounter (Signed)
Ok to rx wheelchair

## 2015-06-12 NOTE — Telephone Encounter (Signed)
Please advise      KP 

## 2015-06-12 NOTE — Telephone Encounter (Signed)
As per patient [facility] is requesting a RX for a generic wheelchair (f) [phone]

## 2015-06-12 NOTE — Telephone Encounter (Signed)
Spoke with [facility] and she said it was before, I scheduled her for 06/16/15.     KP

## 2015-06-12 NOTE — Telephone Encounter (Signed)
Was that before or after therapy?  If before -- we should see her  If after -- have OT check it before next time.

## 2015-06-15 ENCOUNTER — Telehealth: Payer: Self-pay | Admitting: Family Medicine

## 2015-06-15 DIAGNOSIS — M19012 Primary osteoarthritis, left shoulder: Secondary | ICD-10-CM | POA: Diagnosis not present

## 2015-06-15 DIAGNOSIS — E669 Obesity, unspecified: Secondary | ICD-10-CM | POA: Diagnosis not present

## 2015-06-15 DIAGNOSIS — M170 Bilateral primary osteoarthritis of knee: Secondary | ICD-10-CM | POA: Diagnosis not present

## 2015-06-15 DIAGNOSIS — M6281 Muscle weakness (generalized): Secondary | ICD-10-CM | POA: Diagnosis not present

## 2015-06-15 DIAGNOSIS — M19011 Primary osteoarthritis, right shoulder: Secondary | ICD-10-CM | POA: Diagnosis not present

## 2015-06-15 DIAGNOSIS — F339 Major depressive disorder, recurrent, unspecified: Secondary | ICD-10-CM | POA: Diagnosis not present

## 2015-06-15 DIAGNOSIS — R32 Unspecified urinary incontinence: Secondary | ICD-10-CM | POA: Diagnosis not present

## 2015-06-15 DIAGNOSIS — I10 Essential (primary) hypertension: Secondary | ICD-10-CM | POA: Diagnosis not present

## 2015-06-15 DIAGNOSIS — G894 Chronic pain syndrome: Secondary | ICD-10-CM | POA: Diagnosis not present

## 2015-06-15 NOTE — Telephone Encounter (Signed)
Order faxed along with notes.    KP

## 2015-06-15 NOTE — Telephone Encounter (Signed)
We will see her tomorrow

## 2015-06-15 NOTE — Telephone Encounter (Signed)
[name]:Mallori-Gentiva Relation to pt: Call back number:[phone] Pharmacy:  Reason for call: pt's blood pressure was elevated again today 154/90 could have been higher pt could not tolerate the bp cuff so home nurse stopped, want Dr Etter Sjogren to be aware.

## 2015-06-15 NOTE — Telephone Encounter (Signed)
I scheduled the patient last week for a BP check tomorrow already, would you want to do anything else?      KP

## 2015-06-16 ENCOUNTER — Encounter: Payer: Self-pay | Admitting: Family Medicine

## 2015-06-16 ENCOUNTER — Ambulatory Visit (INDEPENDENT_AMBULATORY_CARE_PROVIDER_SITE_OTHER): Payer: Medicare Other | Admitting: Family Medicine

## 2015-06-16 VITALS — BP 150/84 | HR 65

## 2015-06-16 DIAGNOSIS — E785 Hyperlipidemia, unspecified: Secondary | ICD-10-CM

## 2015-06-16 DIAGNOSIS — G894 Chronic pain syndrome: Secondary | ICD-10-CM

## 2015-06-16 DIAGNOSIS — I10 Essential (primary) hypertension: Secondary | ICD-10-CM | POA: Diagnosis not present

## 2015-06-16 MED ORDER — METOPROLOL SUCCINATE ER 25 MG PO TB24: 25.0000 mg | ORAL_TABLET | Freq: Every day | ORAL | Status: DC

## 2015-06-16 MED ORDER — LOSARTAN POTASSIUM 50 MG PO TABS: 50.0000 mg | ORAL_TABLET | Freq: Every day | ORAL | Status: DC

## 2015-06-16 MED ORDER — HYDROCODONE-ACETAMINOPHEN 5-325 MG PO TABS: 1.0000 | ORAL_TABLET | Freq: Four times a day (QID) | ORAL | Status: DC | PRN

## 2015-06-16 NOTE — Patient Instructions (Signed)

## 2015-06-17 DIAGNOSIS — E669 Obesity, unspecified: Secondary | ICD-10-CM | POA: Diagnosis not present

## 2015-06-17 DIAGNOSIS — M6281 Muscle weakness (generalized): Secondary | ICD-10-CM | POA: Diagnosis not present

## 2015-06-17 DIAGNOSIS — F339 Major depressive disorder, recurrent, unspecified: Secondary | ICD-10-CM | POA: Diagnosis not present

## 2015-06-17 DIAGNOSIS — G894 Chronic pain syndrome: Secondary | ICD-10-CM | POA: Diagnosis not present

## 2015-06-17 DIAGNOSIS — I10 Essential (primary) hypertension: Secondary | ICD-10-CM | POA: Diagnosis not present

## 2015-06-17 DIAGNOSIS — M19011 Primary osteoarthritis, right shoulder: Secondary | ICD-10-CM | POA: Diagnosis not present

## 2015-06-17 DIAGNOSIS — R32 Unspecified urinary incontinence: Secondary | ICD-10-CM | POA: Diagnosis not present

## 2015-06-17 DIAGNOSIS — M19012 Primary osteoarthritis, left shoulder: Secondary | ICD-10-CM | POA: Diagnosis not present

## 2015-06-17 DIAGNOSIS — M170 Bilateral primary osteoarthritis of knee: Secondary | ICD-10-CM | POA: Diagnosis not present

## 2015-06-17 LAB — BASIC METABOLIC PANEL
BUN: 25 mg/dL — AB (ref 6–23)
CO2: 31 mEq/L (ref 19–32)
Calcium: 9.1 mg/dL (ref 8.4–10.5)
Chloride: 106 mEq/L (ref 96–112)
Creatinine, Ser: 0.94 mg/dL (ref 0.40–1.20)
GFR: 60.45 mL/min (ref >60.00)
GLUCOSE: 85 mg/dL (ref 70–99)
Potassium: 4.2 mEq/L (ref 3.5–5.1)
Sodium: 142 mEq/L (ref 135–145)

## 2015-06-17 LAB — HEPATIC FUNCTION PANEL
ALBUMIN: 3.5 g/dL (ref 3.5–5.2)
ALT: 9 U/L (ref 0–35)
AST: 13 U/L (ref 0–37)
Alkaline Phosphatase: 41 U/L (ref 39–117)
BILIRUBIN DIRECT: 0.1 mg/dL (ref 0.0–0.3)
Total Bilirubin: 0.3 mg/dL (ref 0.2–1.2)
Total Protein: 6.9 g/dL (ref 6.0–8.3)

## 2015-06-17 LAB — LIPID PANEL
CHOLESTEROL: 115 mg/dL (ref 0–200)
HDL: 42.1 mg/dL (ref >39.00)
LDL CALC: 53 mg/dL (ref 0–99)
NonHDL: 73.36
Total CHOL/HDL Ratio: 3
Triglycerides: 102 mg/dL (ref 0.0–149.0)
VLDL: 20.4 mg/dL (ref 0.0–40.0)

## 2015-06-17 NOTE — Assessment & Plan Note (Signed)
Add losartan  Recheck 2-3 weeks

## 2015-06-17 NOTE — Progress Notes (Signed)
Patient ID: Norma Ford, female    DOB: 11/19/1929  Age: 81 y.o. MRN: [id number]    Subjective:  Subjective HPI MARIELLA Ford presents for f/u bp.  She has had home health coming for PT and they have been checking her bp.  It has been running high before they even start.  See phone notes.  Pt also needs a refill on her pain med and they are having trouble getting the power wheelchair she needs.     Review of Systems  Constitutional: Negative for diaphoresis, appetite change, fatigue and unexpected weight change.  Eyes: Negative for pain, redness and visual disturbance.  Respiratory: Negative for cough, chest tightness, shortness of breath and wheezing.   Cardiovascular: Negative for chest pain, palpitations and leg swelling.  Endocrine: Negative for cold intolerance, heat intolerance, polydipsia, polyphagia and polyuria.  Genitourinary: Negative for dysuria, frequency and difficulty urinating.  Musculoskeletal: Positive for back pain and arthralgias.  Neurological: Negative for dizziness, light-headedness, numbness and headaches.  Psychiatric/Behavioral: Negative for dysphoric mood and decreased concentration. The patient is not nervous/anxious.     History Past Medical History  Diagnosis Date  . Hyperlipidemia   . HTN (hypertension)   . Cataracts, bilateral   . History of abnormal mammogram     2001-h/o abnormal mammo-told just calcium deposit-repeat  . 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 Kidney stone   . Barrett's esophagus 1999  . THROMBOCYTOPENIA 12/21/2009  . MYCOSIS FUNGOIDES LYMPH NODES MULTIPLE SITES 09/21/2009  . DECUBITUS ULCER, BUTTOCK 12/21/2009    She has past surgical history that includes Total knee arthroplasty; Esophagogastroduodenoscopy (03/2002); tubal ligation; Abdominal hysterectomy; Tonsillectomy; and appendectomy.   Her family history includes Heart disease in her father and mother; Kidney disease in her father.She reports that she has  never smoked. She has never used smokeless tobacco. Her alcohol and drug histories are not on file.  Current Outpatient Prescriptions on File Prior to Visit  Medication Sig Dispense Refill  . clotrimazole-betamethasone (LOTRISONE) cream APPLY ONE APPLICATION TOPICALLY TWO TIMES DAILY 45 g 0  . furosemide (LASIX) 40 MG tablet Take 1 tablet by mouth two  times daily 180 tablet 1  . Misc. Devices (COMMODE BEDSIDE) MISC Bariatric Bedside commode 1 each 0  . mometasone (NASONEX) 50 MCG/ACT nasal spray Place 2 sprays into the nose daily.      . Multiple Vitamin (MULTIVITAMIN) tablet Take 1 tablet by mouth daily.      . NONFORMULARY OR COMPOUNDED ITEM Power chair [id number] compass HD medicare code [phone] Diagnosis severe osteoarthritis, urinary incontinence , chronic pain edema 1 each 0  . polyethylene glycol powder (GLYCOLAX/MIRALAX) powder Take 17 g by mouth 2 (two) times daily as needed. 3350 g 1  . potassium chloride SA (K-DUR,KLOR-CON) 20 MEQ tablet Take 1 tablet by mouth two  times daily 180 tablet 1  . sertraline (ZOLOFT) 50 MG tablet Take 1 tablet by mouth  daily 90 tablet 1  . simvastatin (ZOCOR) 80 MG tablet Take 1 tablet by mouth at  bedtime 90 tablet 1  . sulfamethoxazole-trimethoprim (BACTRIM DS,SEPTRA DS) 800-160 MG per tablet Take 1 tablet by mouth 2 (two) times daily. 14 tablet 0  . traMADol (ULTRAM) 50 MG tablet Take 1 tablet (50 mg total) by mouth daily. 90 tablet 0   No current facility-administered medications on file prior to visit.     Objective:  Objective Physical Exam  Constitutional: She is oriented to person, place, and time. She  appears well-developed and well-nourished.  HENT:  Head: Normocephalic and atraumatic.  Eyes: Conjunctivae and EOM are normal.  Neck: Normal range of motion. Neck supple. No JVD present. Carotid bruit is not present. No thyromegaly present.  Cardiovascular: Normal rate, regular rhythm and normal heart sounds.   No murmur  heard. Pulmonary/Chest: Effort normal and breath sounds normal. No respiratory distress. She has no wheezes. She has no rales. She exhibits no tenderness.  Musculoskeletal: She exhibits no edema.  Neurological: She is alert and oriented to person, place, and time.  Psychiatric: She has a normal mood and affect. Her behavior is normal.  Nursing note and vitals reviewed.  BP 150/84 mmHg  Pulse 65  Wt   SpO2 96% Wt Readings from Last 3 Encounters:  04/17/15 287 lb 3.2 oz (130.273 kg)  11/10/14 296 lb 6.4 oz (134.446 kg)  02/24/14 277 lb (125.646 kg)     Lab Results  Component Value Date   WBC 5.9 02/24/2014   HGB 13.6 02/24/2014   HCT 41.3 02/24/2014   PLT 172.0 02/24/2014   GLUCOSE 85 06/16/2015   CHOL 115 06/16/2015   TRIG 102.0 06/16/2015   HDL 42.10 06/16/2015   LDLCALC 53 06/16/2015   ALT 9 06/16/2015   AST 13 06/16/2015   NA 142 06/16/2015   K 4.2 06/16/2015   CL 106 06/16/2015   CREATININE 0.94 06/16/2015   BUN 25* 06/16/2015   CO2 31 06/16/2015   TSH 1.75 09/21/2009   MICROALBUR 0.7 11/10/2014    Dg Chest 2 View  11/10/2014   CLINICAL DATA:  One-month history of cough  EXAM: CHEST  2 VIEW  COMPARISON:  PA and lateral chest x-ray of October 05, 2011  FINDINGS: The lungs are slightly less well inflated today. There is stable scarring at both lung bases. There is no pleural effusion or alveolar infiltrate. The cardiac silhouette remains mildly enlarged. The pulmonary vascularity is not engorged. The trachea is midline. There is degenerative change of both shoulders.  IMPRESSION: There is no active cardiopulmonary disease. There is bibasilar scarring.   Electronically Signed   By: David  Martinique   On: 11/10/2014 16:29     Assessment & Plan:  Plan I have discontinued Ms. Quang's metoprolol succinate. I am also having her start on losartan and metoprolol succinate. Additionally, I am having her maintain her multivitamin, mometasone, polyethylene glycol powder,  clotrimazole-betamethasone, NONFORMULARY OR COMPOUNDED ITEM, sulfamethoxazole-trimethoprim, potassium chloride SA, simvastatin, furosemide, sertraline, Commode Bedside, traMADol, and HYDROcodone-acetaminophen.  Meds ordered this encounter  Medications  . losartan (COZAAR) 50 MG tablet    Sig: Take 1 tablet (50 mg total) by mouth daily.    Dispense:  90 tablet    Refill:  3  . HYDROcodone-acetaminophen (NORCO/VICODIN) 5-325 MG per tablet    Sig: Take 1 tablet by mouth every 6 (six) hours as needed for moderate pain.    Dispense:  90 tablet    Refill:  0  . metoprolol succinate (TOPROL-XL) 25 MG 24 hr tablet    Sig: Take 1 tablet (25 mg total) by mouth daily.    Dispense:  90 tablet    Refill:  3    Problem List Items Addressed This Visit    None    Visit Diagnoses    Essential hypertension    -  Primary    Relevant Medications    losartan (COZAAR) 50 MG tablet    metoprolol succinate (TOPROL-XL) 25 MG 24 hr tablet    Other Relevant  Orders    Basic metabolic panel (Completed)    Hepatic function panel (Completed)    Lipid panel (Completed)    Chronic pain syndrome        Relevant Medications    HYDROcodone-acetaminophen (NORCO/VICODIN) 5-325 MG per tablet    Hyperlipidemia        Relevant Medications    losartan (COZAAR) 50 MG tablet    metoprolol succinate (TOPROL-XL) 25 MG 24 hr tablet    Other Relevant Orders    Basic metabolic panel (Completed)    Hepatic function panel (Completed)    Lipid panel (Completed)       Follow-up: Return in about 3 weeks (around 07/07/2015), or if symptoms worsen or fail to improve, for hypertension.  Garnet Koyanagi, DO

## 2015-06-19 ENCOUNTER — Telehealth: Payer: Self-pay | Admitting: Family Medicine

## 2015-06-19 DIAGNOSIS — G894 Chronic pain syndrome: Secondary | ICD-10-CM | POA: Diagnosis not present

## 2015-06-19 DIAGNOSIS — M19012 Primary osteoarthritis, left shoulder: Secondary | ICD-10-CM | POA: Diagnosis not present

## 2015-06-19 DIAGNOSIS — I10 Essential (primary) hypertension: Secondary | ICD-10-CM | POA: Diagnosis not present

## 2015-06-19 DIAGNOSIS — M19011 Primary osteoarthritis, right shoulder: Secondary | ICD-10-CM | POA: Diagnosis not present

## 2015-06-19 DIAGNOSIS — R32 Unspecified urinary incontinence: Secondary | ICD-10-CM | POA: Diagnosis not present

## 2015-06-19 DIAGNOSIS — M170 Bilateral primary osteoarthritis of knee: Secondary | ICD-10-CM | POA: Diagnosis not present

## 2015-06-19 DIAGNOSIS — F339 Major depressive disorder, recurrent, unspecified: Secondary | ICD-10-CM | POA: Diagnosis not present

## 2015-06-19 DIAGNOSIS — M6281 Muscle weakness (generalized): Secondary | ICD-10-CM | POA: Diagnosis not present

## 2015-06-19 DIAGNOSIS — E669 Obesity, unspecified: Secondary | ICD-10-CM | POA: Diagnosis not present

## 2015-06-19 NOTE — Telephone Encounter (Signed)
done

## 2015-06-19 NOTE — Telephone Encounter (Signed)
[name]: Jackelyn Poling with [facility] Can be reached: [phone]  Reason for call: Lots of things going on with pts power wheelchair. She said there has been a lot of back and forth with pts son and she needs to discuss some issues with you before calling him back.

## 2015-06-19 NOTE — Telephone Encounter (Signed)
Faxed.   KP 

## 2015-06-19 NOTE — Addendum Note (Signed)
Addended by: Rosalita Chessman on: 06/19/2015 01:17 PM   Modules accepted: Miquel Dunn

## 2015-06-19 NOTE — Telephone Encounter (Signed)
Debbie called and stated they are going to take a Demo out to the patient's home next week, she said she needed you to addend our note to say " I am seeing the patient today for a wheel chair assessment and PT evaluation for a powerchair." The patient may need another face to face if this does not work.      KP

## 2015-06-23 DIAGNOSIS — E669 Obesity, unspecified: Secondary | ICD-10-CM | POA: Diagnosis not present

## 2015-06-23 DIAGNOSIS — M170 Bilateral primary osteoarthritis of knee: Secondary | ICD-10-CM | POA: Diagnosis not present

## 2015-06-23 DIAGNOSIS — M6281 Muscle weakness (generalized): Secondary | ICD-10-CM | POA: Diagnosis not present

## 2015-06-23 DIAGNOSIS — R32 Unspecified urinary incontinence: Secondary | ICD-10-CM | POA: Diagnosis not present

## 2015-06-23 DIAGNOSIS — M19012 Primary osteoarthritis, left shoulder: Secondary | ICD-10-CM | POA: Diagnosis not present

## 2015-06-23 DIAGNOSIS — M19011 Primary osteoarthritis, right shoulder: Secondary | ICD-10-CM | POA: Diagnosis not present

## 2015-06-23 DIAGNOSIS — G894 Chronic pain syndrome: Secondary | ICD-10-CM | POA: Diagnosis not present

## 2015-06-23 DIAGNOSIS — I10 Essential (primary) hypertension: Secondary | ICD-10-CM | POA: Diagnosis not present

## 2015-06-23 DIAGNOSIS — F339 Major depressive disorder, recurrent, unspecified: Secondary | ICD-10-CM | POA: Diagnosis not present

## 2015-06-24 DIAGNOSIS — M6281 Muscle weakness (generalized): Secondary | ICD-10-CM | POA: Diagnosis not present

## 2015-06-24 DIAGNOSIS — M19012 Primary osteoarthritis, left shoulder: Secondary | ICD-10-CM | POA: Diagnosis not present

## 2015-06-24 DIAGNOSIS — I10 Essential (primary) hypertension: Secondary | ICD-10-CM | POA: Diagnosis not present

## 2015-06-24 DIAGNOSIS — R32 Unspecified urinary incontinence: Secondary | ICD-10-CM | POA: Diagnosis not present

## 2015-06-24 DIAGNOSIS — E669 Obesity, unspecified: Secondary | ICD-10-CM | POA: Diagnosis not present

## 2015-06-24 DIAGNOSIS — M170 Bilateral primary osteoarthritis of knee: Secondary | ICD-10-CM | POA: Diagnosis not present

## 2015-06-24 DIAGNOSIS — F339 Major depressive disorder, recurrent, unspecified: Secondary | ICD-10-CM | POA: Diagnosis not present

## 2015-06-24 DIAGNOSIS — M19011 Primary osteoarthritis, right shoulder: Secondary | ICD-10-CM | POA: Diagnosis not present

## 2015-06-24 DIAGNOSIS — G894 Chronic pain syndrome: Secondary | ICD-10-CM | POA: Diagnosis not present

## 2015-06-26 DIAGNOSIS — M19012 Primary osteoarthritis, left shoulder: Secondary | ICD-10-CM | POA: Diagnosis not present

## 2015-06-26 DIAGNOSIS — M6281 Muscle weakness (generalized): Secondary | ICD-10-CM | POA: Diagnosis not present

## 2015-06-26 DIAGNOSIS — F339 Major depressive disorder, recurrent, unspecified: Secondary | ICD-10-CM | POA: Diagnosis not present

## 2015-06-26 DIAGNOSIS — R32 Unspecified urinary incontinence: Secondary | ICD-10-CM | POA: Diagnosis not present

## 2015-06-26 DIAGNOSIS — M170 Bilateral primary osteoarthritis of knee: Secondary | ICD-10-CM | POA: Diagnosis not present

## 2015-06-26 DIAGNOSIS — M19011 Primary osteoarthritis, right shoulder: Secondary | ICD-10-CM | POA: Diagnosis not present

## 2015-06-26 DIAGNOSIS — I10 Essential (primary) hypertension: Secondary | ICD-10-CM | POA: Diagnosis not present

## 2015-06-26 DIAGNOSIS — G894 Chronic pain syndrome: Secondary | ICD-10-CM | POA: Diagnosis not present

## 2015-06-26 DIAGNOSIS — E669 Obesity, unspecified: Secondary | ICD-10-CM | POA: Diagnosis not present

## 2015-06-30 ENCOUNTER — Other Ambulatory Visit: Payer: Self-pay

## 2015-06-30 DIAGNOSIS — M170 Bilateral primary osteoarthritis of knee: Secondary | ICD-10-CM | POA: Diagnosis not present

## 2015-06-30 DIAGNOSIS — F339 Major depressive disorder, recurrent, unspecified: Secondary | ICD-10-CM | POA: Diagnosis not present

## 2015-06-30 DIAGNOSIS — R32 Unspecified urinary incontinence: Secondary | ICD-10-CM | POA: Diagnosis not present

## 2015-06-30 DIAGNOSIS — E669 Obesity, unspecified: Secondary | ICD-10-CM | POA: Diagnosis not present

## 2015-06-30 DIAGNOSIS — M19011 Primary osteoarthritis, right shoulder: Secondary | ICD-10-CM | POA: Diagnosis not present

## 2015-06-30 DIAGNOSIS — M19012 Primary osteoarthritis, left shoulder: Secondary | ICD-10-CM | POA: Diagnosis not present

## 2015-06-30 DIAGNOSIS — G894 Chronic pain syndrome: Secondary | ICD-10-CM | POA: Diagnosis not present

## 2015-06-30 DIAGNOSIS — I10 Essential (primary) hypertension: Secondary | ICD-10-CM | POA: Diagnosis not present

## 2015-06-30 DIAGNOSIS — M6281 Muscle weakness (generalized): Secondary | ICD-10-CM | POA: Diagnosis not present

## 2015-06-30 NOTE — Telephone Encounter (Signed)
Norma Ford  Verbal given to continue OT, twice a week for 1 week effective 07/05/15.     KP

## 2015-07-01 ENCOUNTER — Telehealth: Payer: Self-pay | Admitting: Family Medicine

## 2015-07-01 ENCOUNTER — Telehealth: Payer: Self-pay | Admitting: *Deleted

## 2015-07-01 DIAGNOSIS — M19012 Primary osteoarthritis, left shoulder: Secondary | ICD-10-CM | POA: Diagnosis not present

## 2015-07-01 DIAGNOSIS — M6281 Muscle weakness (generalized): Secondary | ICD-10-CM | POA: Diagnosis not present

## 2015-07-01 DIAGNOSIS — R32 Unspecified urinary incontinence: Secondary | ICD-10-CM | POA: Diagnosis not present

## 2015-07-01 DIAGNOSIS — G894 Chronic pain syndrome: Secondary | ICD-10-CM | POA: Diagnosis not present

## 2015-07-01 DIAGNOSIS — M170 Bilateral primary osteoarthritis of knee: Secondary | ICD-10-CM | POA: Diagnosis not present

## 2015-07-01 DIAGNOSIS — E669 Obesity, unspecified: Secondary | ICD-10-CM | POA: Diagnosis not present

## 2015-07-01 DIAGNOSIS — F339 Major depressive disorder, recurrent, unspecified: Secondary | ICD-10-CM | POA: Diagnosis not present

## 2015-07-01 DIAGNOSIS — M19011 Primary osteoarthritis, right shoulder: Secondary | ICD-10-CM | POA: Diagnosis not present

## 2015-07-01 DIAGNOSIS — I10 Essential (primary) hypertension: Secondary | ICD-10-CM | POA: Diagnosis not present

## 2015-07-01 NOTE — Telephone Encounter (Signed)
Dr Etter Sjogren-- please advise re: pt concern for weaning off metoprolol?  See additional phone note re: power chair.

## 2015-07-01 NOTE — Telephone Encounter (Signed)
[name]: Norma Ford  Relationship to patient: Self  Can be reached: [phone] Pharmacy:  Reason for call: Pt has a question about her medications. She also want to check on the status of her motorized wheelchair. Please call back to advise.

## 2015-07-01 NOTE — Telephone Encounter (Signed)
Norma Ford with [facility] came by office and left forms to be completed for pt's powerchair. States they need 2 addendums to previous office visit if possible. 1)  Needs to states reason that pt cannot use a scooter (larger, has handle bars). States usually equipment is too large for home environment. Power chair is smaller and has joystick operation.   2)  Needs patient's pain level on a scale of 1 - 10.   Ronny Flurry, CMA at 07/01/2015 10:33 AM     Status: Signed       Expand All Collapse All   Left message with Norma Ford at [facility], [phone] to call with status of chair.             Shiquita C Johnson at 07/01/2015 9:45 AM     Status: Signed       Expand All Collapse All   [name]: Norma Ford  Relationship to patient: Self  Can be reached: [phone] Pharmacy:  Reason for call: Pt has a question about her medications. She also want to check on the status of her motorized wheelchair. Please call back to advise.

## 2015-07-01 NOTE — Telephone Encounter (Signed)
Left message with Jackelyn Poling at [facility], [phone] to call with status of chair. Spoke with pt, she states she was told to wean off of metoprolol. She has been cutting tablet in 1/2 and she is wondering how to taper off medication completely?  Please advise.

## 2015-07-02 DIAGNOSIS — M170 Bilateral primary osteoarthritis of knee: Secondary | ICD-10-CM | POA: Diagnosis not present

## 2015-07-02 DIAGNOSIS — I10 Essential (primary) hypertension: Secondary | ICD-10-CM | POA: Diagnosis not present

## 2015-07-02 DIAGNOSIS — M19011 Primary osteoarthritis, right shoulder: Secondary | ICD-10-CM | POA: Diagnosis not present

## 2015-07-02 DIAGNOSIS — E669 Obesity, unspecified: Secondary | ICD-10-CM | POA: Diagnosis not present

## 2015-07-02 DIAGNOSIS — G894 Chronic pain syndrome: Secondary | ICD-10-CM | POA: Diagnosis not present

## 2015-07-02 DIAGNOSIS — M19012 Primary osteoarthritis, left shoulder: Secondary | ICD-10-CM | POA: Diagnosis not present

## 2015-07-02 DIAGNOSIS — R32 Unspecified urinary incontinence: Secondary | ICD-10-CM | POA: Diagnosis not present

## 2015-07-02 DIAGNOSIS — F339 Major depressive disorder, recurrent, unspecified: Secondary | ICD-10-CM | POA: Diagnosis not present

## 2015-07-02 DIAGNOSIS — M6281 Muscle weakness (generalized): Secondary | ICD-10-CM | POA: Diagnosis not present

## 2015-07-02 NOTE — Telephone Encounter (Signed)
Pt notified and made aware.  She stated understanding. She was also reminded of appointment scheduled for 07/07/15 @ 4pm.  Pt was appreciative of the reminder and said that she was make arrangements so that she could come.  She also asked for an update on her wheelchair.  She was informed that we are still working on it and will call her with updates.  She stated understanding and agreed with plan.

## 2015-07-02 NOTE — Telephone Encounter (Signed)
To MD to complete.

## 2015-07-02 NOTE — Progress Notes (Signed)
   Subjective:    Patient ID: Norma Ford, female    DOB: September 29, 1930, 81 y.o.   MRN: [id number]  HPI    Review of Systems     Objective:   Physical Exam        Assessment & Plan:

## 2015-07-02 NOTE — Telephone Encounter (Signed)
If she is taking 1/2 she can just stop

## 2015-07-03 DIAGNOSIS — M170 Bilateral primary osteoarthritis of knee: Secondary | ICD-10-CM | POA: Diagnosis not present

## 2015-07-03 DIAGNOSIS — M6281 Muscle weakness (generalized): Secondary | ICD-10-CM | POA: Diagnosis not present

## 2015-07-03 DIAGNOSIS — R32 Unspecified urinary incontinence: Secondary | ICD-10-CM | POA: Diagnosis not present

## 2015-07-03 DIAGNOSIS — M19012 Primary osteoarthritis, left shoulder: Secondary | ICD-10-CM | POA: Diagnosis not present

## 2015-07-03 DIAGNOSIS — G894 Chronic pain syndrome: Secondary | ICD-10-CM | POA: Diagnosis not present

## 2015-07-03 DIAGNOSIS — F339 Major depressive disorder, recurrent, unspecified: Secondary | ICD-10-CM | POA: Diagnosis not present

## 2015-07-03 DIAGNOSIS — M19011 Primary osteoarthritis, right shoulder: Secondary | ICD-10-CM | POA: Diagnosis not present

## 2015-07-03 DIAGNOSIS — I10 Essential (primary) hypertension: Secondary | ICD-10-CM | POA: Diagnosis not present

## 2015-07-03 DIAGNOSIS — E669 Obesity, unspecified: Secondary | ICD-10-CM | POA: Diagnosis not present

## 2015-07-06 DIAGNOSIS — M19011 Primary osteoarthritis, right shoulder: Secondary | ICD-10-CM | POA: Diagnosis not present

## 2015-07-06 DIAGNOSIS — F339 Major depressive disorder, recurrent, unspecified: Secondary | ICD-10-CM | POA: Diagnosis not present

## 2015-07-06 DIAGNOSIS — R32 Unspecified urinary incontinence: Secondary | ICD-10-CM | POA: Diagnosis not present

## 2015-07-06 DIAGNOSIS — M6281 Muscle weakness (generalized): Secondary | ICD-10-CM | POA: Diagnosis not present

## 2015-07-06 DIAGNOSIS — M170 Bilateral primary osteoarthritis of knee: Secondary | ICD-10-CM | POA: Diagnosis not present

## 2015-07-06 DIAGNOSIS — M19012 Primary osteoarthritis, left shoulder: Secondary | ICD-10-CM | POA: Diagnosis not present

## 2015-07-06 DIAGNOSIS — I10 Essential (primary) hypertension: Secondary | ICD-10-CM | POA: Diagnosis not present

## 2015-07-06 DIAGNOSIS — G894 Chronic pain syndrome: Secondary | ICD-10-CM | POA: Diagnosis not present

## 2015-07-06 DIAGNOSIS — E669 Obesity, unspecified: Secondary | ICD-10-CM | POA: Diagnosis not present

## 2015-07-07 ENCOUNTER — Encounter: Payer: Self-pay | Admitting: Family Medicine

## 2015-07-07 ENCOUNTER — Ambulatory Visit (INDEPENDENT_AMBULATORY_CARE_PROVIDER_SITE_OTHER): Payer: Medicare Other | Admitting: Family Medicine

## 2015-07-07 ENCOUNTER — Telehealth: Payer: Self-pay | Admitting: Family Medicine

## 2015-07-07 VITALS — BP 134/72 | HR 69 | Temp 98.2°F

## 2015-07-07 DIAGNOSIS — Z23 Encounter for immunization: Secondary | ICD-10-CM | POA: Diagnosis not present

## 2015-07-07 NOTE — Telephone Encounter (Signed)
Verbal given.     KP 

## 2015-07-07 NOTE — Telephone Encounter (Signed)
Mechele Claude with [facility] [phone]  Continue home health PT x 1 week (2 visits)

## 2015-07-07 NOTE — Progress Notes (Signed)
Pre visit review using our clinic review tool, if applicable. No additional management support is needed unless otherwise documented below in the visit note. 

## 2015-07-07 NOTE — Progress Notes (Signed)
Patient ID: Norma Ford, female    DOB: December 24, 1929  Age: 81 y.o. MRN: [id number]    Subjective:  Subjective HPI Norma Ford presents for bp check.  She is also requesting prevnar and flu.     Review of Systems  Constitutional: Negative for diaphoresis, appetite change, fatigue and unexpected weight change.  Eyes: Negative for pain, redness and visual disturbance.  Respiratory: Negative for cough, chest tightness, shortness of breath and wheezing.   Cardiovascular: Negative for chest pain, palpitations and leg swelling.  Endocrine: Negative for cold intolerance, heat intolerance, polydipsia, polyphagia and polyuria.  Genitourinary: Negative for dysuria, frequency and difficulty urinating.  Neurological: Negative for dizziness, light-headedness, numbness and headaches.    History Past Medical History  Diagnosis Date  . Hyperlipidemia   . HTN (hypertension)   . Cataracts, bilateral   . History of abnormal mammogram     2001-h/o abnormal mammo-told just calcium deposit-repeat  . 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 Kidney stone   . Barrett's esophagus 1999  . THROMBOCYTOPENIA 12/21/2009  . MYCOSIS FUNGOIDES LYMPH NODES MULTIPLE SITES 09/21/2009  . DECUBITUS ULCER, BUTTOCK 12/21/2009    She has past surgical history that includes Total knee arthroplasty; Esophagogastroduodenoscopy (03/2002); tubal ligation; Abdominal hysterectomy; Tonsillectomy; and appendectomy.   Her family history includes Heart disease in her father and mother; Kidney disease in her father.She reports that she has never smoked. She has never used smokeless tobacco. Her alcohol and drug histories are not on file.  Current Outpatient Prescriptions on File Prior to Visit  Medication Sig Dispense Refill  . clotrimazole-betamethasone (LOTRISONE) cream APPLY ONE APPLICATION TOPICALLY TWO TIMES DAILY 45 g 0  . furosemide (LASIX) 40 MG tablet Take 1 tablet by mouth two  times daily 180 tablet 1  .  HYDROcodone-acetaminophen (NORCO/VICODIN) 5-325 MG per tablet Take 1 tablet by mouth every 6 (six) hours as needed for moderate pain. 90 tablet 0  . losartan (COZAAR) 50 MG tablet Take 1 tablet (50 mg total) by mouth daily. 90 tablet 3  . metoprolol succinate (TOPROL-XL) 25 MG 24 hr tablet Take 1 tablet (25 mg total) by mouth daily. 90 tablet 3  . Misc. Devices (COMMODE BEDSIDE) MISC Bariatric Bedside commode 1 each 0  . mometasone (NASONEX) 50 MCG/ACT nasal spray Place 2 sprays into the nose daily.      . Multiple Vitamin (MULTIVITAMIN) tablet Take 1 tablet by mouth daily.      . polyethylene glycol powder (GLYCOLAX/MIRALAX) powder Take 17 g by mouth 2 (two) times daily as needed. 3350 g 1  . potassium chloride SA (K-DUR,KLOR-CON) 20 MEQ tablet Take 1 tablet by mouth two  times daily 180 tablet 1  . sertraline (ZOLOFT) 50 MG tablet Take 1 tablet by mouth  daily 90 tablet 1  . simvastatin (ZOCOR) 80 MG tablet Take 1 tablet by mouth at  bedtime 90 tablet 1  . traMADol (ULTRAM) 50 MG tablet Take 1 tablet (50 mg total) by mouth daily. 90 tablet 0   No current facility-administered medications on file prior to visit.     Objective:  Objective Physical Exam  Constitutional: She is oriented to person, place, and time. She appears well-developed and well-nourished.  HENT:  Head: Normocephalic and atraumatic.  Eyes: Conjunctivae and EOM are normal.  Neck: Normal range of motion. Neck supple. No JVD present. Carotid bruit is not present. No thyromegaly present.  Cardiovascular: Normal rate, regular rhythm and normal heart sounds.  No murmur heard. Pulmonary/Chest: Effort normal and breath sounds normal. No respiratory distress. She has no wheezes. She has no rales. She exhibits no tenderness.  Musculoskeletal: She exhibits no edema.  Neurological: She is alert and oriented to person, place, and time.  Psychiatric: She has a normal mood and affect. Her behavior is normal.   BP 134/72 mmHg   Pulse 69  Temp(Src) 98.2 F (36.8 C) (Oral)  SpO2 96% Wt Readings from Last 3 Encounters:  04/17/15 287 lb 3.2 oz (130.273 kg)  11/10/14 296 lb 6.4 oz (134.446 kg)  02/24/14 277 lb (125.646 kg)     Lab Results  Component Value Date   WBC 5.9 02/24/2014   HGB 13.6 02/24/2014   HCT 41.3 02/24/2014   PLT 172.0 02/24/2014   GLUCOSE 85 06/16/2015   CHOL 115 06/16/2015   TRIG 102.0 06/16/2015   HDL 42.10 06/16/2015   LDLCALC 53 06/16/2015   ALT 9 06/16/2015   AST 13 06/16/2015   NA 142 06/16/2015   K 4.2 06/16/2015   CL 106 06/16/2015   CREATININE 0.94 06/16/2015   BUN 25* 06/16/2015   CO2 31 06/16/2015   TSH 1.75 09/21/2009   MICROALBUR 0.7 11/10/2014    Dg Chest 2 View  11/10/2014   CLINICAL DATA:  One-month history of cough  EXAM: CHEST  2 VIEW  COMPARISON:  PA and lateral chest x-ray of October 05, 2011  FINDINGS: The lungs are slightly less well inflated today. There is stable scarring at both lung bases. There is no pleural effusion or alveolar infiltrate. The cardiac silhouette remains mildly enlarged. The pulmonary vascularity is not engorged. The trachea is midline. There is degenerative change of both shoulders.  IMPRESSION: There is no active cardiopulmonary disease. There is bibasilar scarring.   Electronically Signed   By: David  Martinique   On: 11/10/2014 16:29     Assessment & Plan:  Plan I have discontinued Ms. Both's NONFORMULARY OR COMPOUNDED ITEM and sulfamethoxazole-trimethoprim. I am also having her maintain her multivitamin, mometasone, polyethylene glycol powder, clotrimazole-betamethasone, potassium chloride SA, simvastatin, furosemide, sertraline, Commode Bedside, traMADol, losartan, HYDROcodone-acetaminophen, and metoprolol succinate.  No orders of the defined types were placed in this encounter.    Problem List Items Addressed This Visit    None      Follow-up: Return in about 3 months (around 10/07/2015), or if symptoms worsen or fail to improve,  for hypertension.  Garnet Koyanagi, DO

## 2015-07-07 NOTE — Patient Instructions (Signed)

## 2015-07-08 ENCOUNTER — Telehealth: Payer: Self-pay | Admitting: Family Medicine

## 2015-07-08 DIAGNOSIS — M19012 Primary osteoarthritis, left shoulder: Secondary | ICD-10-CM | POA: Diagnosis not present

## 2015-07-08 DIAGNOSIS — M19011 Primary osteoarthritis, right shoulder: Secondary | ICD-10-CM | POA: Diagnosis not present

## 2015-07-08 DIAGNOSIS — M170 Bilateral primary osteoarthritis of knee: Secondary | ICD-10-CM | POA: Diagnosis not present

## 2015-07-08 DIAGNOSIS — F339 Major depressive disorder, recurrent, unspecified: Secondary | ICD-10-CM | POA: Diagnosis not present

## 2015-07-08 DIAGNOSIS — I10 Essential (primary) hypertension: Secondary | ICD-10-CM | POA: Diagnosis not present

## 2015-07-08 DIAGNOSIS — R32 Unspecified urinary incontinence: Secondary | ICD-10-CM | POA: Diagnosis not present

## 2015-07-08 DIAGNOSIS — E669 Obesity, unspecified: Secondary | ICD-10-CM | POA: Diagnosis not present

## 2015-07-08 DIAGNOSIS — G894 Chronic pain syndrome: Secondary | ICD-10-CM

## 2015-07-08 DIAGNOSIS — M6281 Muscle weakness (generalized): Secondary | ICD-10-CM | POA: Diagnosis not present

## 2015-07-08 MED ORDER — HYDROCODONE-ACETAMINOPHEN 5-325 MG PO TABS: 1.0000 | ORAL_TABLET | Freq: Four times a day (QID) | ORAL | Status: DC | PRN

## 2015-07-08 NOTE — Telephone Encounter (Signed)
Pt needing refill on hydrocodone, said that she has 10 ud left, she takes 1/day. She said that she knows it's a little early for it but her daughter will need to get it filled for her when it's time. She is requesting call from [facility].

## 2015-07-08 NOTE — Telephone Encounter (Signed)
Med filled and given to provider for signature.

## 2015-07-08 NOTE — Telephone Encounter (Signed)
Last OV 07/07/15 Hydrocodone last filled 06/16/15 #90 with 0

## 2015-07-08 NOTE — Telephone Encounter (Signed)
Refill x1 

## 2015-07-10 DIAGNOSIS — R32 Unspecified urinary incontinence: Secondary | ICD-10-CM | POA: Diagnosis not present

## 2015-07-10 DIAGNOSIS — E669 Obesity, unspecified: Secondary | ICD-10-CM | POA: Diagnosis not present

## 2015-07-10 DIAGNOSIS — M170 Bilateral primary osteoarthritis of knee: Secondary | ICD-10-CM | POA: Diagnosis not present

## 2015-07-10 DIAGNOSIS — M19011 Primary osteoarthritis, right shoulder: Secondary | ICD-10-CM | POA: Diagnosis not present

## 2015-07-10 DIAGNOSIS — M6281 Muscle weakness (generalized): Secondary | ICD-10-CM | POA: Diagnosis not present

## 2015-07-10 DIAGNOSIS — M19012 Primary osteoarthritis, left shoulder: Secondary | ICD-10-CM | POA: Diagnosis not present

## 2015-07-10 DIAGNOSIS — F339 Major depressive disorder, recurrent, unspecified: Secondary | ICD-10-CM | POA: Diagnosis not present

## 2015-07-10 DIAGNOSIS — G894 Chronic pain syndrome: Secondary | ICD-10-CM | POA: Diagnosis not present

## 2015-07-10 DIAGNOSIS — I10 Essential (primary) hypertension: Secondary | ICD-10-CM | POA: Diagnosis not present

## 2015-07-15 DIAGNOSIS — I10 Essential (primary) hypertension: Secondary | ICD-10-CM | POA: Diagnosis not present

## 2015-07-15 DIAGNOSIS — M19012 Primary osteoarthritis, left shoulder: Secondary | ICD-10-CM | POA: Diagnosis not present

## 2015-07-15 DIAGNOSIS — M170 Bilateral primary osteoarthritis of knee: Secondary | ICD-10-CM | POA: Diagnosis not present

## 2015-07-15 DIAGNOSIS — G894 Chronic pain syndrome: Secondary | ICD-10-CM | POA: Diagnosis not present

## 2015-07-15 DIAGNOSIS — M6281 Muscle weakness (generalized): Secondary | ICD-10-CM | POA: Diagnosis not present

## 2015-07-15 DIAGNOSIS — R32 Unspecified urinary incontinence: Secondary | ICD-10-CM | POA: Diagnosis not present

## 2015-07-15 DIAGNOSIS — M19011 Primary osteoarthritis, right shoulder: Secondary | ICD-10-CM | POA: Diagnosis not present

## 2015-07-15 DIAGNOSIS — E669 Obesity, unspecified: Secondary | ICD-10-CM | POA: Diagnosis not present

## 2015-07-15 DIAGNOSIS — F339 Major depressive disorder, recurrent, unspecified: Secondary | ICD-10-CM | POA: Diagnosis not present

## 2015-07-17 DIAGNOSIS — R32 Unspecified urinary incontinence: Secondary | ICD-10-CM | POA: Diagnosis not present

## 2015-07-17 DIAGNOSIS — I10 Essential (primary) hypertension: Secondary | ICD-10-CM | POA: Diagnosis not present

## 2015-07-17 DIAGNOSIS — M19011 Primary osteoarthritis, right shoulder: Secondary | ICD-10-CM | POA: Diagnosis not present

## 2015-07-17 DIAGNOSIS — M6281 Muscle weakness (generalized): Secondary | ICD-10-CM | POA: Diagnosis not present

## 2015-07-17 DIAGNOSIS — M170 Bilateral primary osteoarthritis of knee: Secondary | ICD-10-CM | POA: Diagnosis not present

## 2015-07-17 DIAGNOSIS — G894 Chronic pain syndrome: Secondary | ICD-10-CM | POA: Diagnosis not present

## 2015-07-17 DIAGNOSIS — E669 Obesity, unspecified: Secondary | ICD-10-CM | POA: Diagnosis not present

## 2015-07-17 DIAGNOSIS — F339 Major depressive disorder, recurrent, unspecified: Secondary | ICD-10-CM | POA: Diagnosis not present

## 2015-07-17 DIAGNOSIS — M19012 Primary osteoarthritis, left shoulder: Secondary | ICD-10-CM | POA: Diagnosis not present

## 2015-07-21 ENCOUNTER — Other Ambulatory Visit: Payer: Self-pay | Admitting: Family Medicine

## 2015-07-21 DIAGNOSIS — Z9181 History of falling: Secondary | ICD-10-CM | POA: Diagnosis not present

## 2015-07-21 DIAGNOSIS — M19012 Primary osteoarthritis, left shoulder: Secondary | ICD-10-CM | POA: Diagnosis not present

## 2015-07-21 DIAGNOSIS — G894 Chronic pain syndrome: Secondary | ICD-10-CM | POA: Diagnosis not present

## 2015-07-21 DIAGNOSIS — M6281 Muscle weakness (generalized): Secondary | ICD-10-CM | POA: Diagnosis not present

## 2015-07-21 DIAGNOSIS — M19011 Primary osteoarthritis, right shoulder: Secondary | ICD-10-CM | POA: Diagnosis not present

## 2015-07-21 DIAGNOSIS — F329 Major depressive disorder, single episode, unspecified: Secondary | ICD-10-CM | POA: Diagnosis not present

## 2015-07-21 DIAGNOSIS — I10 Essential (primary) hypertension: Secondary | ICD-10-CM | POA: Diagnosis not present

## 2015-07-21 DIAGNOSIS — M170 Bilateral primary osteoarthritis of knee: Secondary | ICD-10-CM | POA: Diagnosis not present

## 2015-07-22 MED ORDER — LOSARTAN POTASSIUM 50 MG PO TABS: 50.0000 mg | ORAL_TABLET | Freq: Every day | ORAL | Status: DC

## 2015-07-22 NOTE — Telephone Encounter (Signed)
Rx phoned in(Erica) to the pharmacy.//AB/CMA

## 2015-07-24 ENCOUNTER — Other Ambulatory Visit: Payer: Self-pay

## 2015-07-24 DIAGNOSIS — F329 Major depressive disorder, single episode, unspecified: Secondary | ICD-10-CM | POA: Diagnosis not present

## 2015-07-24 DIAGNOSIS — M6281 Muscle weakness (generalized): Secondary | ICD-10-CM | POA: Diagnosis not present

## 2015-07-24 DIAGNOSIS — M19012 Primary osteoarthritis, left shoulder: Secondary | ICD-10-CM | POA: Diagnosis not present

## 2015-07-24 DIAGNOSIS — Z9181 History of falling: Secondary | ICD-10-CM | POA: Diagnosis not present

## 2015-07-24 DIAGNOSIS — M19011 Primary osteoarthritis, right shoulder: Secondary | ICD-10-CM | POA: Diagnosis not present

## 2015-07-24 DIAGNOSIS — M170 Bilateral primary osteoarthritis of knee: Secondary | ICD-10-CM | POA: Diagnosis not present

## 2015-07-24 DIAGNOSIS — G894 Chronic pain syndrome: Secondary | ICD-10-CM | POA: Diagnosis not present

## 2015-07-24 DIAGNOSIS — I10 Essential (primary) hypertension: Secondary | ICD-10-CM | POA: Diagnosis not present

## 2015-07-24 MED ORDER — POLYETHYLENE GLYCOL 3350 17 GM/SCOOP PO POWD: 17.0000 g | Freq: Two times a day (BID) | ORAL | Status: DC | PRN

## 2015-07-27 DIAGNOSIS — M170 Bilateral primary osteoarthritis of knee: Secondary | ICD-10-CM | POA: Diagnosis not present

## 2015-07-27 DIAGNOSIS — M6281 Muscle weakness (generalized): Secondary | ICD-10-CM | POA: Diagnosis not present

## 2015-07-27 DIAGNOSIS — F329 Major depressive disorder, single episode, unspecified: Secondary | ICD-10-CM | POA: Diagnosis not present

## 2015-07-27 DIAGNOSIS — M19011 Primary osteoarthritis, right shoulder: Secondary | ICD-10-CM | POA: Diagnosis not present

## 2015-07-27 DIAGNOSIS — G894 Chronic pain syndrome: Secondary | ICD-10-CM | POA: Diagnosis not present

## 2015-07-27 DIAGNOSIS — Z9181 History of falling: Secondary | ICD-10-CM | POA: Diagnosis not present

## 2015-07-27 DIAGNOSIS — M19012 Primary osteoarthritis, left shoulder: Secondary | ICD-10-CM | POA: Diagnosis not present

## 2015-07-27 DIAGNOSIS — I10 Essential (primary) hypertension: Secondary | ICD-10-CM | POA: Diagnosis not present

## 2015-07-29 DIAGNOSIS — F329 Major depressive disorder, single episode, unspecified: Secondary | ICD-10-CM | POA: Diagnosis not present

## 2015-07-29 DIAGNOSIS — I10 Essential (primary) hypertension: Secondary | ICD-10-CM | POA: Diagnosis not present

## 2015-07-29 DIAGNOSIS — Z9181 History of falling: Secondary | ICD-10-CM | POA: Diagnosis not present

## 2015-07-29 DIAGNOSIS — M19011 Primary osteoarthritis, right shoulder: Secondary | ICD-10-CM | POA: Diagnosis not present

## 2015-07-29 DIAGNOSIS — M6281 Muscle weakness (generalized): Secondary | ICD-10-CM | POA: Diagnosis not present

## 2015-07-29 DIAGNOSIS — G894 Chronic pain syndrome: Secondary | ICD-10-CM | POA: Diagnosis not present

## 2015-07-29 DIAGNOSIS — M170 Bilateral primary osteoarthritis of knee: Secondary | ICD-10-CM | POA: Diagnosis not present

## 2015-07-29 DIAGNOSIS — M19012 Primary osteoarthritis, left shoulder: Secondary | ICD-10-CM | POA: Diagnosis not present

## 2015-08-03 ENCOUNTER — Other Ambulatory Visit: Payer: Self-pay | Admitting: Family Medicine

## 2015-08-03 DIAGNOSIS — I10 Essential (primary) hypertension: Secondary | ICD-10-CM | POA: Diagnosis not present

## 2015-08-03 DIAGNOSIS — M170 Bilateral primary osteoarthritis of knee: Secondary | ICD-10-CM | POA: Diagnosis not present

## 2015-08-03 DIAGNOSIS — M19012 Primary osteoarthritis, left shoulder: Secondary | ICD-10-CM | POA: Diagnosis not present

## 2015-08-03 DIAGNOSIS — M6281 Muscle weakness (generalized): Secondary | ICD-10-CM | POA: Diagnosis not present

## 2015-08-03 DIAGNOSIS — Z9181 History of falling: Secondary | ICD-10-CM | POA: Diagnosis not present

## 2015-08-03 DIAGNOSIS — F329 Major depressive disorder, single episode, unspecified: Secondary | ICD-10-CM | POA: Diagnosis not present

## 2015-08-03 DIAGNOSIS — G894 Chronic pain syndrome: Secondary | ICD-10-CM | POA: Diagnosis not present

## 2015-08-03 DIAGNOSIS — M19011 Primary osteoarthritis, right shoulder: Secondary | ICD-10-CM | POA: Diagnosis not present

## 2015-08-06 DIAGNOSIS — Z9181 History of falling: Secondary | ICD-10-CM | POA: Diagnosis not present

## 2015-08-06 DIAGNOSIS — M19012 Primary osteoarthritis, left shoulder: Secondary | ICD-10-CM | POA: Diagnosis not present

## 2015-08-06 DIAGNOSIS — M19011 Primary osteoarthritis, right shoulder: Secondary | ICD-10-CM | POA: Diagnosis not present

## 2015-08-06 DIAGNOSIS — I10 Essential (primary) hypertension: Secondary | ICD-10-CM | POA: Diagnosis not present

## 2015-08-06 DIAGNOSIS — M170 Bilateral primary osteoarthritis of knee: Secondary | ICD-10-CM | POA: Diagnosis not present

## 2015-08-06 DIAGNOSIS — G894 Chronic pain syndrome: Secondary | ICD-10-CM | POA: Diagnosis not present

## 2015-08-06 DIAGNOSIS — M6281 Muscle weakness (generalized): Secondary | ICD-10-CM | POA: Diagnosis not present

## 2015-08-06 DIAGNOSIS — F329 Major depressive disorder, single episode, unspecified: Secondary | ICD-10-CM | POA: Diagnosis not present

## 2015-08-07 ENCOUNTER — Telehealth: Payer: Self-pay

## 2015-08-07 MED ORDER — TRAMADOL HCL 50 MG PO TABS: 50.0000 mg | ORAL_TABLET | Freq: Every day | ORAL | Status: DC

## 2015-08-07 NOTE — Telephone Encounter (Signed)
Faxed to [facility].      KP

## 2015-08-07 NOTE — Telephone Encounter (Signed)
Last seen 07/07/15 and filled 06/05/15 #90 Last UDS in 2013   Please advise     KP

## 2015-08-07 NOTE — Telephone Encounter (Signed)
Ok to refills--- make sure we send her to lab on next ov

## 2015-08-11 DIAGNOSIS — F329 Major depressive disorder, single episode, unspecified: Secondary | ICD-10-CM | POA: Diagnosis not present

## 2015-08-11 DIAGNOSIS — M170 Bilateral primary osteoarthritis of knee: Secondary | ICD-10-CM | POA: Diagnosis not present

## 2015-08-11 DIAGNOSIS — G894 Chronic pain syndrome: Secondary | ICD-10-CM | POA: Diagnosis not present

## 2015-08-11 DIAGNOSIS — M19011 Primary osteoarthritis, right shoulder: Secondary | ICD-10-CM | POA: Diagnosis not present

## 2015-08-11 DIAGNOSIS — M19012 Primary osteoarthritis, left shoulder: Secondary | ICD-10-CM | POA: Diagnosis not present

## 2015-08-11 DIAGNOSIS — Z9181 History of falling: Secondary | ICD-10-CM | POA: Diagnosis not present

## 2015-08-11 DIAGNOSIS — I10 Essential (primary) hypertension: Secondary | ICD-10-CM | POA: Diagnosis not present

## 2015-08-11 DIAGNOSIS — M6281 Muscle weakness (generalized): Secondary | ICD-10-CM | POA: Diagnosis not present

## 2015-08-13 DIAGNOSIS — M19012 Primary osteoarthritis, left shoulder: Secondary | ICD-10-CM | POA: Diagnosis not present

## 2015-08-13 DIAGNOSIS — F329 Major depressive disorder, single episode, unspecified: Secondary | ICD-10-CM | POA: Diagnosis not present

## 2015-08-13 DIAGNOSIS — Z9181 History of falling: Secondary | ICD-10-CM | POA: Diagnosis not present

## 2015-08-13 DIAGNOSIS — G894 Chronic pain syndrome: Secondary | ICD-10-CM | POA: Diagnosis not present

## 2015-08-13 DIAGNOSIS — M19011 Primary osteoarthritis, right shoulder: Secondary | ICD-10-CM | POA: Diagnosis not present

## 2015-08-13 DIAGNOSIS — M6281 Muscle weakness (generalized): Secondary | ICD-10-CM | POA: Diagnosis not present

## 2015-08-13 DIAGNOSIS — M170 Bilateral primary osteoarthritis of knee: Secondary | ICD-10-CM | POA: Diagnosis not present

## 2015-08-13 DIAGNOSIS — I10 Essential (primary) hypertension: Secondary | ICD-10-CM | POA: Diagnosis not present

## 2015-08-17 ENCOUNTER — Telehealth: Payer: Self-pay | Admitting: Family Medicine

## 2015-08-17 DIAGNOSIS — I10 Essential (primary) hypertension: Secondary | ICD-10-CM | POA: Diagnosis not present

## 2015-08-17 DIAGNOSIS — G894 Chronic pain syndrome: Secondary | ICD-10-CM

## 2015-08-17 DIAGNOSIS — M19011 Primary osteoarthritis, right shoulder: Secondary | ICD-10-CM | POA: Diagnosis not present

## 2015-08-17 DIAGNOSIS — M6281 Muscle weakness (generalized): Secondary | ICD-10-CM | POA: Diagnosis not present

## 2015-08-17 DIAGNOSIS — F329 Major depressive disorder, single episode, unspecified: Secondary | ICD-10-CM | POA: Diagnosis not present

## 2015-08-17 DIAGNOSIS — M170 Bilateral primary osteoarthritis of knee: Secondary | ICD-10-CM | POA: Diagnosis not present

## 2015-08-17 DIAGNOSIS — M19012 Primary osteoarthritis, left shoulder: Secondary | ICD-10-CM | POA: Diagnosis not present

## 2015-08-17 DIAGNOSIS — Z9181 History of falling: Secondary | ICD-10-CM | POA: Diagnosis not present

## 2015-08-17 MED ORDER — HYDROCODONE-ACETAMINOPHEN 5-325 MG PO TABS: 1.0000 | ORAL_TABLET | Freq: Four times a day (QID) | ORAL | Status: DC | PRN

## 2015-08-17 NOTE — Telephone Encounter (Signed)
Relation to PB:DHDI  Call back number: [phone] Pharmacy:  Reason for call:  Patient requesting a refill HYDROcodone-acetaminophen (NORCO/VICODIN) 5-325 MG per tablet. (patient would to speak with Maudie Mercury directly due to a personal question)

## 2015-08-17 NOTE — Telephone Encounter (Signed)
Last seen 07/07/15 and filled 07/08/15 #90 UDS 10/23/12 low risk  Please advise    KP

## 2015-08-17 NOTE — Telephone Encounter (Signed)
Refill x1 

## 2015-08-17 NOTE — Telephone Encounter (Signed)
Patient aware Rx ready for pick up.      KP 

## 2015-08-19 DIAGNOSIS — M19011 Primary osteoarthritis, right shoulder: Secondary | ICD-10-CM | POA: Diagnosis not present

## 2015-08-19 DIAGNOSIS — M170 Bilateral primary osteoarthritis of knee: Secondary | ICD-10-CM | POA: Diagnosis not present

## 2015-08-19 DIAGNOSIS — M6281 Muscle weakness (generalized): Secondary | ICD-10-CM | POA: Diagnosis not present

## 2015-08-19 DIAGNOSIS — I10 Essential (primary) hypertension: Secondary | ICD-10-CM | POA: Diagnosis not present

## 2015-08-19 DIAGNOSIS — G894 Chronic pain syndrome: Secondary | ICD-10-CM | POA: Diagnosis not present

## 2015-08-19 DIAGNOSIS — M19012 Primary osteoarthritis, left shoulder: Secondary | ICD-10-CM | POA: Diagnosis not present

## 2015-08-19 DIAGNOSIS — F329 Major depressive disorder, single episode, unspecified: Secondary | ICD-10-CM | POA: Diagnosis not present

## 2015-08-19 DIAGNOSIS — Z9181 History of falling: Secondary | ICD-10-CM | POA: Diagnosis not present

## 2015-08-24 DIAGNOSIS — G894 Chronic pain syndrome: Secondary | ICD-10-CM | POA: Diagnosis not present

## 2015-08-24 DIAGNOSIS — I10 Essential (primary) hypertension: Secondary | ICD-10-CM | POA: Diagnosis not present

## 2015-08-24 DIAGNOSIS — M19012 Primary osteoarthritis, left shoulder: Secondary | ICD-10-CM | POA: Diagnosis not present

## 2015-08-24 DIAGNOSIS — M170 Bilateral primary osteoarthritis of knee: Secondary | ICD-10-CM | POA: Diagnosis not present

## 2015-08-24 DIAGNOSIS — Z9181 History of falling: Secondary | ICD-10-CM | POA: Diagnosis not present

## 2015-08-24 DIAGNOSIS — M6281 Muscle weakness (generalized): Secondary | ICD-10-CM | POA: Diagnosis not present

## 2015-08-24 DIAGNOSIS — M19011 Primary osteoarthritis, right shoulder: Secondary | ICD-10-CM | POA: Diagnosis not present

## 2015-08-24 DIAGNOSIS — F329 Major depressive disorder, single episode, unspecified: Secondary | ICD-10-CM | POA: Diagnosis not present

## 2015-08-26 DIAGNOSIS — M170 Bilateral primary osteoarthritis of knee: Secondary | ICD-10-CM | POA: Diagnosis not present

## 2015-08-26 DIAGNOSIS — G894 Chronic pain syndrome: Secondary | ICD-10-CM | POA: Diagnosis not present

## 2015-08-26 DIAGNOSIS — Z9181 History of falling: Secondary | ICD-10-CM | POA: Diagnosis not present

## 2015-08-26 DIAGNOSIS — M19012 Primary osteoarthritis, left shoulder: Secondary | ICD-10-CM | POA: Diagnosis not present

## 2015-08-26 DIAGNOSIS — M6281 Muscle weakness (generalized): Secondary | ICD-10-CM | POA: Diagnosis not present

## 2015-08-26 DIAGNOSIS — I10 Essential (primary) hypertension: Secondary | ICD-10-CM | POA: Diagnosis not present

## 2015-08-26 DIAGNOSIS — M19011 Primary osteoarthritis, right shoulder: Secondary | ICD-10-CM | POA: Diagnosis not present

## 2015-08-26 DIAGNOSIS — F329 Major depressive disorder, single episode, unspecified: Secondary | ICD-10-CM | POA: Diagnosis not present

## 2015-08-31 DIAGNOSIS — M170 Bilateral primary osteoarthritis of knee: Secondary | ICD-10-CM | POA: Diagnosis not present

## 2015-08-31 DIAGNOSIS — M6281 Muscle weakness (generalized): Secondary | ICD-10-CM | POA: Diagnosis not present

## 2015-08-31 DIAGNOSIS — M19011 Primary osteoarthritis, right shoulder: Secondary | ICD-10-CM | POA: Diagnosis not present

## 2015-08-31 DIAGNOSIS — F329 Major depressive disorder, single episode, unspecified: Secondary | ICD-10-CM | POA: Diagnosis not present

## 2015-08-31 DIAGNOSIS — I10 Essential (primary) hypertension: Secondary | ICD-10-CM | POA: Diagnosis not present

## 2015-08-31 DIAGNOSIS — G894 Chronic pain syndrome: Secondary | ICD-10-CM | POA: Diagnosis not present

## 2015-08-31 DIAGNOSIS — M19012 Primary osteoarthritis, left shoulder: Secondary | ICD-10-CM | POA: Diagnosis not present

## 2015-08-31 DIAGNOSIS — Z9181 History of falling: Secondary | ICD-10-CM | POA: Diagnosis not present

## 2015-09-02 DIAGNOSIS — I10 Essential (primary) hypertension: Secondary | ICD-10-CM | POA: Diagnosis not present

## 2015-09-02 DIAGNOSIS — M19011 Primary osteoarthritis, right shoulder: Secondary | ICD-10-CM | POA: Diagnosis not present

## 2015-09-02 DIAGNOSIS — F329 Major depressive disorder, single episode, unspecified: Secondary | ICD-10-CM | POA: Diagnosis not present

## 2015-09-02 DIAGNOSIS — M19012 Primary osteoarthritis, left shoulder: Secondary | ICD-10-CM | POA: Diagnosis not present

## 2015-09-02 DIAGNOSIS — Z9181 History of falling: Secondary | ICD-10-CM | POA: Diagnosis not present

## 2015-09-02 DIAGNOSIS — M170 Bilateral primary osteoarthritis of knee: Secondary | ICD-10-CM | POA: Diagnosis not present

## 2015-09-02 DIAGNOSIS — G894 Chronic pain syndrome: Secondary | ICD-10-CM | POA: Diagnosis not present

## 2015-09-02 DIAGNOSIS — M6281 Muscle weakness (generalized): Secondary | ICD-10-CM | POA: Diagnosis not present

## 2015-09-07 DIAGNOSIS — Z9181 History of falling: Secondary | ICD-10-CM | POA: Diagnosis not present

## 2015-09-07 DIAGNOSIS — M19011 Primary osteoarthritis, right shoulder: Secondary | ICD-10-CM | POA: Diagnosis not present

## 2015-09-07 DIAGNOSIS — G894 Chronic pain syndrome: Secondary | ICD-10-CM | POA: Diagnosis not present

## 2015-09-07 DIAGNOSIS — M170 Bilateral primary osteoarthritis of knee: Secondary | ICD-10-CM | POA: Diagnosis not present

## 2015-09-07 DIAGNOSIS — F329 Major depressive disorder, single episode, unspecified: Secondary | ICD-10-CM | POA: Diagnosis not present

## 2015-09-07 DIAGNOSIS — M6281 Muscle weakness (generalized): Secondary | ICD-10-CM | POA: Diagnosis not present

## 2015-09-07 DIAGNOSIS — M19012 Primary osteoarthritis, left shoulder: Secondary | ICD-10-CM | POA: Diagnosis not present

## 2015-09-07 DIAGNOSIS — I10 Essential (primary) hypertension: Secondary | ICD-10-CM | POA: Diagnosis not present

## 2015-09-16 ENCOUNTER — Telehealth: Payer: Self-pay | Admitting: Family Medicine

## 2015-09-16 ENCOUNTER — Telehealth: Payer: Self-pay | Admitting: *Deleted

## 2015-09-16 DIAGNOSIS — G894 Chronic pain syndrome: Secondary | ICD-10-CM

## 2015-09-16 NOTE — Telephone Encounter (Signed)
Relation to LD:KCCQ Call back number: [phone]   Reason for call:  Patient requesting a refill HYDROcodone-acetaminophen (NORCO/VICODIN) 5-325 MG tablet.

## 2015-09-16 NOTE — Telephone Encounter (Signed)
Last filled: 08/17/15 Amt: 90, 0 Last OV: 07/07/15 Due for screen UDS 10/23/12 low risk  Please advise.

## 2015-09-16 NOTE — Telephone Encounter (Signed)
Forwarded to Dr. Etter Sjogren for review/signature. JG//CMA

## 2015-09-17 DIAGNOSIS — F329 Major depressive disorder, single episode, unspecified: Secondary | ICD-10-CM

## 2015-09-17 DIAGNOSIS — G894 Chronic pain syndrome: Secondary | ICD-10-CM

## 2015-09-17 DIAGNOSIS — M170 Bilateral primary osteoarthritis of knee: Secondary | ICD-10-CM | POA: Diagnosis not present

## 2015-09-17 DIAGNOSIS — M6281 Muscle weakness (generalized): Secondary | ICD-10-CM | POA: Diagnosis not present

## 2015-09-17 DIAGNOSIS — M19012 Primary osteoarthritis, left shoulder: Secondary | ICD-10-CM | POA: Diagnosis not present

## 2015-09-17 DIAGNOSIS — M19011 Primary osteoarthritis, right shoulder: Secondary | ICD-10-CM | POA: Diagnosis not present

## 2015-09-17 MED ORDER — HYDROCODONE-ACETAMINOPHEN 5-325 MG PO TABS: 1.0000 | ORAL_TABLET | Freq: Four times a day (QID) | ORAL | Status: DC | PRN

## 2015-09-17 NOTE — Telephone Encounter (Signed)
Patient aware the Rx is ready for pick up.     KP 

## 2015-09-17 NOTE — Telephone Encounter (Signed)
Rx printed and placed in Dr. Lowne's red folder for review and signature. 

## 2015-09-17 NOTE — Telephone Encounter (Signed)
Refill x1 

## 2015-09-22 ENCOUNTER — Other Ambulatory Visit: Payer: Self-pay

## 2015-09-22 MED ORDER — TRAMADOL HCL 50 MG PO TABS: 50.0000 mg | ORAL_TABLET | Freq: Every day | ORAL | Status: DC

## 2015-09-22 NOTE — Telephone Encounter (Signed)
Last seen 07/07/15 and filled 08/07/15 #90  Please advise     KP

## 2015-09-23 NOTE — Telephone Encounter (Signed)
Forms faxed to [facility] successfully. Sent for scanning. JG//CMA  

## 2015-10-03 DIAGNOSIS — M170 Bilateral primary osteoarthritis of knee: Secondary | ICD-10-CM | POA: Diagnosis not present

## 2015-10-06 ENCOUNTER — Telehealth: Payer: Self-pay

## 2015-10-06 DIAGNOSIS — E785 Hyperlipidemia, unspecified: Secondary | ICD-10-CM

## 2015-10-19 ENCOUNTER — Ambulatory Visit: Payer: Medicare Other | Admitting: Family Medicine

## 2015-10-22 ENCOUNTER — Encounter: Payer: Self-pay | Admitting: Medical

## 2015-10-22 ENCOUNTER — Ambulatory Visit (INDEPENDENT_AMBULATORY_CARE_PROVIDER_SITE_OTHER): Payer: Medicare Other | Admitting: Medical

## 2015-10-22 VITALS — BP 128/78 | HR 83 | Temp 98.6°F

## 2015-10-22 DIAGNOSIS — F329 Major depressive disorder, single episode, unspecified: Secondary | ICD-10-CM

## 2015-10-22 DIAGNOSIS — G894 Chronic pain syndrome: Secondary | ICD-10-CM

## 2015-10-22 DIAGNOSIS — F32A Depression, unspecified: Secondary | ICD-10-CM

## 2015-10-22 DIAGNOSIS — W19XXXA Unspecified fall, initial encounter: Secondary | ICD-10-CM | POA: Diagnosis not present

## 2015-10-22 DIAGNOSIS — E785 Hyperlipidemia, unspecified: Secondary | ICD-10-CM

## 2015-10-22 LAB — LIPID PANEL
CHOL/HDL RATIO: 4
CHOLESTEROL: 183 mg/dL (ref 0–200)
HDL: 42.2 mg/dL (ref >39.00)
LDL CALC: 111 mg/dL — AB (ref 0–99)
NONHDL: 140.64
Triglycerides: 146 mg/dL (ref 0.0–149.0)
VLDL: 29.2 mg/dL (ref 0.0–40.0)

## 2015-10-22 LAB — COMPREHENSIVE METABOLIC PANEL
ALBUMIN: 3.5 g/dL (ref 3.5–5.2)
ALT: 10 U/L (ref 0–35)
AST: 14 U/L (ref 0–37)
Alkaline Phosphatase: 52 U/L (ref 39–117)
BUN: 15 mg/dL (ref 6–23)
CHLORIDE: 103 meq/L (ref 96–112)
CO2: 33 meq/L — AB (ref 19–32)
CREATININE: 0.69 mg/dL (ref 0.40–1.20)
Calcium: 9.1 mg/dL (ref 8.4–10.5)
GFR: 86.3 mL/min (ref >60.00)
GLUCOSE: 75 mg/dL (ref 70–99)
POTASSIUM: 4.2 meq/L (ref 3.5–5.1)
SODIUM: 141 meq/L (ref 135–145)
Total Bilirubin: 0.3 mg/dL (ref 0.2–1.2)
Total Protein: 6.7 g/dL (ref 6.0–8.3)

## 2015-10-22 MED ORDER — HYDROCODONE-ACETAMINOPHEN 5-325 MG PO TABS: 1.0000 | ORAL_TABLET | Freq: Four times a day (QID) | ORAL | Status: DC | PRN

## 2015-10-22 NOTE — Telephone Encounter (Signed)
Pt stopped statin  3 wks due to diffuse thigh pain on both sides. Her pain improved almost immediately(she is convinced statin cause of thigh pain). She is now on fish oil. Lipids worsened a little. But not that bad. She wanted me to notify you. Do you want me to just advise continue fish oil and repeat lipid in another 3 months.

## 2015-10-22 NOTE — Progress Notes (Signed)
Subjective:    Patient ID: Norma Ford, female    DOB: [birth year], 81 y.o.   MRN: [id number]  HPI  Pt is in for cholesterol check. August is when her last check was done. Pt did stop her cholesterol medication about 2-3 weeks ago. Pt states he ws feeling achiness in both thighs. Son had myalgias and stopped the medications/statin. His leg pains got better. Pt thought her statin may have caused the same reaction. So she stopped statin. Used fish oil since. He thighs both feel a lot better now.  Pt states she has area of groin down leg. She states this is chronic. She get hydrocodone from her pcp. Dr. Etter Ford treats her for chronic pain syndrome.  Pt had fall on September 18, 2015. Was on walker and talking on phone. She fell hit her rt side. Contused various area. EMS came and evaluated. Since then bruises healed. No residual areas of pain.  Pt wants note to be excused from [place] duty.   Pt bit sad husband death in Mar 30, 2023. She is on sertraline 50 mg a day.     Review of Systems  Constitutional: Negative for fever, chills and fatigue.  Respiratory: Negative for cough and wheezing.   Cardiovascular: Negative for chest pain and palpitations.  Gastrointestinal: Negative for abdominal pain.  Musculoskeletal:       See hpi.  Neurological: Negative for dizziness and headaches.  Psychiatric/Behavioral: Positive for dysphoric mood. Negative for suicidal ideas and behavioral problems.          Past Medical History  Diagnosis Date  . Hyperlipidemia   . HTN (hypertension)   . Cataracts, bilateral   . History of abnormal mammogram     2001-h/o abnormal mammo-told just calcium deposit-repeat  . 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 Kidney stone   . Barrett's esophagus 1999  . THROMBOCYTOPENIA 12/21/2009  . MYCOSIS FUNGOIDES LYMPH NODES MULTIPLE SITES 09/21/2009  . DECUBITUS ULCER, BUTTOCK 12/21/2009    Social History   Social History  . Marital Status: Married   Spouse Name: N/A  . Number of Children: N/A  . Years of Education: N/A   Occupational History  . Not on file.   Social History Main Topics  . Smoking status: Never Smoker   . Smokeless tobacco: Never Used  . Alcohol Use: Not on file  . Drug Use: Not on file  . Sexual Activity: No   Other Topics Concern  . Not on file   Social History Narrative    Past Surgical History  Procedure Laterality Date  . Total knee arthroplasty      left 2009, right 2003  . Esophagogastroduodenoscopy  Mar 29, 2002  . Tubal ligation    . Abdominal hysterectomy    . Tonsillectomy    . Appendectomy      Family History  Problem Relation Age of Onset  . Heart disease Mother   . Heart disease Father     pacemaker  . Kidney disease Father     Allergies  Allergen Reactions  . Codeine     REACTION: ITCHING    Current Outpatient Prescriptions on File Prior to Visit  Medication Sig Dispense Refill  . clotrimazole-betamethasone (LOTRISONE) cream APPLY ONE APPLICATION TOPICALLY TWO TIMES DAILY 45 g 0  . furosemide (LASIX) 40 MG tablet Take 1 tablet by mouth two  times daily 180 tablet 1  . losartan (COZAAR) 50 MG tablet Take 1 tablet (50 mg total) by mouth  daily. 90 tablet 3  . metoprolol succinate (TOPROL-XL) 25 MG 24 hr tablet Take 1 tablet (25 mg total) by mouth daily. 90 tablet 3  . Misc. Devices (COMMODE BEDSIDE) MISC Bariatric Bedside commode 1 each 0  . mometasone (NASONEX) 50 MCG/ACT nasal spray Place 2 sprays into the nose daily.      . Multiple Vitamin (MULTIVITAMIN) tablet Take 1 tablet by mouth daily.      . polyethylene glycol powder (GLYCOLAX/MIRALAX) powder Take 17 g by mouth 2 (two) times daily as needed. 3350 g 3  . potassium chloride SA (K-DUR,KLOR-CON) 20 MEQ tablet Take 1 tablet by mouth two  times daily 180 tablet 1  . sertraline (ZOLOFT) 50 MG tablet Take 1 tablet by mouth  daily 90 tablet 1  . simvastatin (ZOCOR) 80 MG tablet Take 1 tablet by mouth at  bedtime (Patient not  taking: Reported on 10/22/2015) 90 tablet 1  . traMADol (ULTRAM) 50 MG tablet Take 1 tablet (50 mg total) by mouth daily. 90 tablet 0   No current facility-administered medications on file prior to visit.    BP 128/78 mmHg  Pulse 83  Temp(Src) 98.6 F (37 C) (Oral)  Ht   Wt   SpO2 93%     Objective:   Physical Exam  General- No acute distress. Pleasant patient. In power chari Neck- Full range of motion, no jvd Lungs- Clear, even and unlabored. Heart- regular rate and rhythm. Neurologic- CNII- XII grossly intact.  Abdomen- soft nt, nd, +bs, no rebound or guarding. Rt side shoulder and hip- no pain on palpation. No bruising. Thighs- on palpation no tenderness(less pain now that she stopped statin.)       Assessment & Plan:  Will get your lipid panel and cmp. Continue fish oil. Will mention to Dr. Etter Ford your stopping of statin medication. See if she agrees.Stay off statin presently.  For chronic pain syndrome will continue hydrocodone. I will rx #45 tablets since I could not find Dr. Etter Ford to fill #90.   Asked family to call Dr. Etter Ford and Norma Ford for further refills if needed.  For depression use sertraline. If mood worsens then may increase dose of sertraline. Notify us.  I would recommend you use your chair to avoid any future falls.  I typed letter informing court of your conditions so they can make informed decision on if you are able to serve.  Follow up with Dr. Etter Ford in 3 wks or as needed

## 2015-10-22 NOTE — Progress Notes (Signed)
Pre visit review using our clinic review tool, if applicable. No additional management support is needed unless otherwise documented below in the visit note. 

## 2015-10-22 NOTE — Patient Instructions (Addendum)
Will get your lipid panel and cmp. Continue fish oil. Will mention to Dr. Etter Sjogren your stopping of statin medication. See if she agrees.Stay off statin presently.  For chronic pain syndrome will continue hydrocodone. I will rx #45 tablets since I could not find Dr. Etter Sjogren to fill #90.   For depression use sertraline. If mood worsens then may increase dose of sertraline. Notify us.  I would recommend you use your chair to avoid any future falls.  I typed letter informing court of your conditions so they can make informed decision on if you are able to serve.  Follow up with Dr. Etter Sjogren in 3 wks or as needed

## 2015-10-26 NOTE — Telephone Encounter (Signed)
Repeat in 2 months

## 2015-10-26 NOTE — Telephone Encounter (Signed)
Patient has an upcoming appointment with Dr. Etter Sjogren.

## 2015-10-26 NOTE — Telephone Encounter (Signed)
Will you put future order to recheck lipid panel in 2 months fasting. Notify pt this is when Dr. Etter Sjogren wants it checked since she stopped her statin med.

## 2015-11-02 DIAGNOSIS — M170 Bilateral primary osteoarthritis of knee: Secondary | ICD-10-CM | POA: Diagnosis not present

## 2015-11-12 ENCOUNTER — Encounter: Payer: Self-pay | Admitting: Family Medicine

## 2015-11-12 ENCOUNTER — Ambulatory Visit (INDEPENDENT_AMBULATORY_CARE_PROVIDER_SITE_OTHER): Payer: Medicare Other | Admitting: Family Medicine

## 2015-11-12 VITALS — BP 128/66 | HR 69 | Temp 97.5°F

## 2015-11-12 DIAGNOSIS — E785 Hyperlipidemia, unspecified: Secondary | ICD-10-CM | POA: Diagnosis not present

## 2015-11-12 DIAGNOSIS — I10 Essential (primary) hypertension: Secondary | ICD-10-CM

## 2015-11-12 DIAGNOSIS — G894 Chronic pain syndrome: Secondary | ICD-10-CM | POA: Diagnosis not present

## 2015-11-12 DIAGNOSIS — M25551 Pain in right hip: Secondary | ICD-10-CM

## 2015-11-12 DIAGNOSIS — Z9181 History of falling: Secondary | ICD-10-CM

## 2015-11-12 DIAGNOSIS — M25552 Pain in left hip: Secondary | ICD-10-CM

## 2015-11-12 MED ORDER — TRAMADOL HCL 50 MG PO TABS: 50.0000 mg | ORAL_TABLET | Freq: Every day | ORAL | Status: DC

## 2015-11-12 MED ORDER — LOSARTAN POTASSIUM 50 MG PO TABS: 50.0000 mg | ORAL_TABLET | Freq: Every day | ORAL | Status: DC

## 2015-11-12 MED ORDER — HYDROCODONE-ACETAMINOPHEN 5-325 MG PO TABS: 1.0000 | ORAL_TABLET | Freq: Four times a day (QID) | ORAL | Status: DC | PRN

## 2015-11-12 MED FILL — HYDROCODON-APAP 5-325: 5-325 | 12 days supply | Qty: 45 | Fill #0

## 2015-11-12 NOTE — Progress Notes (Signed)
\Patient ID: Norma Ford, female    DOB: July 21, 1930  Age: 82 y.o. MRN: [id number]    Subjective:  Subjective HPI Norma Ford presents for f/u htn , cholesterol and c/o hip pain.  Pt had a fall and she was achy and hip and low back never completely healed.  No other complaints.     Review of Systems  Constitutional: Negative for fever and chills.  HENT: Negative for congestion, postnasal drip, rhinorrhea and sinus pressure.   Respiratory: Negative for cough, chest tightness, shortness of breath and wheezing.   Cardiovascular: Negative for chest pain, palpitations and leg swelling.  Musculoskeletal: Positive for arthralgias.       R hip pain and low back pain  Allergic/Immunologic: Negative for environmental allergies.    History Past Medical History  Diagnosis Date  . Hyperlipidemia   . HTN (hypertension)   . Cataracts, bilateral   . History of abnormal mammogram     2001-h/o abnormal mammo-told just calcium deposit-repeat  . 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 Kidney stone   . Barrett's esophagus 1999  . THROMBOCYTOPENIA 12/21/2009  . MYCOSIS FUNGOIDES LYMPH NODES MULTIPLE SITES 09/21/2009  . DECUBITUS ULCER, BUTTOCK 12/21/2009    She has past surgical history that includes Total knee arthroplasty; Esophagogastroduodenoscopy (03/2002); tubal ligation; Abdominal hysterectomy; Tonsillectomy; and appendectomy.   Her family history includes Heart disease in her father and mother; Kidney disease in her father.She reports that she has never smoked. She has never used smokeless tobacco. Her alcohol and drug histories are not on file.  Current Outpatient Prescriptions on File Prior to Visit  Medication Sig Dispense Refill  . clotrimazole-betamethasone (LOTRISONE) cream APPLY ONE APPLICATION TOPICALLY TWO TIMES DAILY 45 g 0  . furosemide (LASIX) 40 MG tablet Take 1 tablet by mouth two  times daily 180 tablet 1  . metoprolol succinate (TOPROL-XL) 25 MG 24 hr  tablet Take 1 tablet (25 mg total) by mouth daily. 90 tablet 3  . Misc. Devices (COMMODE BEDSIDE) MISC Bariatric Bedside commode 1 each 0  . mometasone (NASONEX) 50 MCG/ACT nasal spray Place 2 sprays into the nose daily.      . Multiple Vitamin (MULTIVITAMIN) tablet Take 1 tablet by mouth daily.      . polyethylene glycol powder (GLYCOLAX/MIRALAX) powder Take 17 g by mouth 2 (two) times daily as needed. 3350 g 3  . potassium chloride SA (K-DUR,KLOR-CON) 20 MEQ tablet Take 1 tablet by mouth two  times daily 180 tablet 1  . sertraline (ZOLOFT) 50 MG tablet Take 1 tablet by mouth  daily 90 tablet 1  . simvastatin (ZOCOR) 80 MG tablet Take 1 tablet by mouth at  bedtime (Patient not taking: Reported on 10/22/2015) 90 tablet 1   No current facility-administered medications on file prior to visit.     Objective:  Objective Physical Exam  Constitutional: She is oriented to person, place, and time. She appears well-developed and well-nourished.  HENT:  Head: Normocephalic and atraumatic.  Eyes: Conjunctivae and EOM are normal.  Neck: Normal range of motion. Neck supple. No JVD present. Carotid bruit is not present. No thyromegaly present.  Cardiovascular: Normal rate, regular rhythm and normal heart sounds.   No murmur heard. Pulmonary/Chest: Effort normal and breath sounds normal. No respiratory distress. She has no wheezes. She has no rales. She exhibits no tenderness.  Musculoskeletal: She exhibits edema and tenderness.       Right hip: She exhibits decreased range of  motion, decreased strength and tenderness.       Lumbar back: She exhibits tenderness and pain.  Neurological: She is alert and oriented to person, place, and time.  Pt sitting in wheelchair Usually uses walker at home  Psychiatric: She has a normal mood and affect.  Nursing note and vitals reviewed.  BP 128/66 mmHg  Pulse 69  Temp(Src) 97.5 F (36.4 C) (Oral)  Wt   SpO2 94% Wt Readings from Last 3 Encounters:  04/17/15  287 lb 3.2 oz (130.273 kg)  11/10/14 296 lb 6.4 oz (134.446 kg)  02/24/14 277 lb (125.646 kg)     Lab Results  Component Value Date   WBC 5.9 02/24/2014   HGB 13.6 02/24/2014   HCT 41.3 02/24/2014   PLT 172.0 02/24/2014   GLUCOSE 75 10/22/2015   CHOL 183 10/22/2015   TRIG 146.0 10/22/2015   HDL 42.20 10/22/2015   LDLCALC 111* 10/22/2015   ALT 10 10/22/2015   AST 14 10/22/2015   NA 141 10/22/2015   K 4.2 10/22/2015   CL 103 10/22/2015   CREATININE 0.69 10/22/2015   BUN 15 10/22/2015   CO2 33* 10/22/2015   TSH 1.75 09/21/2009   MICROALBUR 0.7 11/10/2014    Dg Chest 2 View  11/10/2014  CLINICAL DATA:  One-month history of cough EXAM: CHEST  2 VIEW COMPARISON:  PA and lateral chest x-ray of October 05, 2011 FINDINGS: The lungs are slightly less well inflated today. There is stable scarring at both lung bases. There is no pleural effusion or alveolar infiltrate. The cardiac silhouette remains mildly enlarged. The pulmonary vascularity is not engorged. The trachea is midline. There is degenerative change of both shoulders. IMPRESSION: There is no active cardiopulmonary disease. There is bibasilar scarring. Electronically Signed   By: David  Martinique   On: 11/10/2014 16:29     Assessment & Plan:  Plan I am having Ms. Sprankle maintain her multivitamin, mometasone, clotrimazole-betamethasone, potassium chloride SA, Commode Bedside, metoprolol succinate, polyethylene glycol powder, simvastatin, sertraline, furosemide, losartan, HYDROcodone-acetaminophen, and traMADol.  Meds ordered this encounter  Medications  . losartan (COZAAR) 50 MG tablet    Sig: Take 1 tablet (50 mg total) by mouth daily.    Dispense:  90 tablet    Refill:  3  . HYDROcodone-acetaminophen (NORCO/VICODIN) 5-325 MG tablet    Sig: Take 1 tablet by mouth every 6 (six) hours as needed for moderate pain.    Dispense:  45 tablet    Refill:  0  . traMADol (ULTRAM) 50 MG tablet    Sig: Take 1 tablet (50 mg total) by mouth  daily.    Dispense:  90 tablet    Refill:  0    Problem List Items Addressed This Visit    History of fall    Refer to home health for PT and evaluation of home for fall risk      Relevant Orders   Ambulatory referral to [place] hypertension - Primary   Relevant Medications   losartan (COZAAR) 50 MG tablet   Other Relevant Orders   Ambulatory referral to [place]    Other Visit Diagnoses    Chronic pain syndrome        Relevant Medications    HYDROcodone-acetaminophen (NORCO/VICODIN) 5-325 MG tablet    traMADol (ULTRAM) 50 MG tablet    Other Relevant Orders    Ambulatory referral to Home Health    Hyperlipidemia        Relevant Medications  losartan (COZAAR) 50 MG tablet    Other Relevant Orders    Ambulatory referral to [place]    Hip pain, bilateral        Relevant Orders    DG HIPS BILAT WITH PELVIS 2V    Ambulatory referral to [place]       Follow-up: Return in about 3 months (around 02/10/2016), or if symptoms worsen or fail to improve, for hypertension, hyperlipidemia.  Garnet Koyanagi, DO

## 2015-11-12 NOTE — Progress Notes (Signed)
Pre visit review using our clinic review tool, if applicable. No additional management support is needed unless otherwise documented below in the visit note. 

## 2015-11-12 NOTE — Patient Instructions (Signed)
Hypertension Hypertension, commonly called high blood pressure, is when the force of blood pumping through your arteries is too strong. Your arteries are the blood vessels that carry blood from your heart throughout your body. A blood pressure reading consists of a higher number over a lower number, such as 110/72. The higher number (systolic) is the pressure inside your arteries when your heart pumps. The lower number (diastolic) is the pressure inside your arteries when your heart relaxes. Ideally you want your blood pressure below 120/80. Hypertension forces your heart to work harder to pump blood. Your arteries may become narrow or stiff. Having untreated or uncontrolled hypertension can cause heart attack, stroke, kidney disease, and other problems. RISK FACTORS Some risk factors for high blood pressure are controllable. Others are not.  Risk factors you cannot control include:   Race. You may be at higher risk if you are African American.  Age. Risk increases with age.  Gender. Men are at higher risk than women before age 45 years. After age 65, women are at higher risk than men. Risk factors you can control include:  Not getting enough exercise or physical activity.  Being overweight.  Getting too much fat, sugar, calories, or salt in your diet.  Drinking too much alcohol. SIGNS AND SYMPTOMS Hypertension does not usually cause signs or symptoms. Extremely high blood pressure (hypertensive crisis) may cause headache, anxiety, shortness of breath, and nosebleed. DIAGNOSIS To check if you have hypertension, your health care provider will measure your blood pressure while you are seated, with your arm held at the level of your heart. It should be measured at least twice using the same arm. Certain conditions can cause a difference in blood pressure between your right and left arms. A blood pressure reading that is higher than normal on one occasion does not mean that you need treatment. If  it is not clear whether you have high blood pressure, you may be asked to return on a different day to have your blood pressure checked again. Or, you may be asked to monitor your blood pressure at home for 1 or more weeks. TREATMENT Treating high blood pressure includes making lifestyle changes and possibly taking medicine. Living a healthy lifestyle can help lower high blood pressure. You may need to change some of your habits. Lifestyle changes may include:  Following the DASH diet. This diet is high in fruits, vegetables, and whole grains. It is low in salt, red meat, and added sugars.  Keep your sodium intake below 2,300 mg per day.  Getting at least 30-45 minutes of aerobic exercise at least 4 times per week.  Losing weight if necessary.  Not smoking.  Limiting alcoholic beverages.  Learning ways to reduce stress. Your health care provider may prescribe medicine if lifestyle changes are not enough to get your blood pressure under control, and if one of the following is true:  You are 18-59 years of age and your systolic blood pressure is above 140.  You are 60 years of age or older, and your systolic blood pressure is above 150.  Your diastolic blood pressure is above 90.  You have diabetes, and your systolic blood pressure is over 140 or your diastolic blood pressure is over 90.  You have kidney disease and your blood pressure is above 140/90.  You have heart disease and your blood pressure is above 140/90. Your personal target blood pressure may vary depending on your medical conditions, your age, and other factors. HOME CARE INSTRUCTIONS    Have your blood pressure rechecked as directed by your health care provider.   Take medicines only as directed by your health care provider. Follow the directions carefully. Blood pressure medicines must be taken as prescribed. The medicine does not work as well when you skip doses. Skipping doses also puts you at risk for  problems.  Do not smoke.   Monitor your blood pressure at home as directed by your health care provider. SEEK MEDICAL CARE IF:   You think you are having a reaction to medicines taken.  You have recurrent headaches or feel dizzy.  You have swelling in your ankles.  You have trouble with your vision. SEEK IMMEDIATE MEDICAL CARE IF:  You develop a severe headache or confusion.  You have unusual weakness, numbness, or feel faint.  You have severe chest or abdominal pain.  You vomit repeatedly.  You have trouble breathing. MAKE SURE YOU:   Understand these instructions.  Will watch your condition.  Will get help right away if you are not doing well or get worse.   This information is not intended to replace advice given to you by your health care provider. Make sure you discuss any questions you have with your health care provider.   Document Released: 10/24/2005 Document Revised: 03/10/2015 Document Reviewed: 08/16/2013 Elsevier Interactive Patient Education 2016 Elsevier Inc.  

## 2015-11-14 DIAGNOSIS — Z9181 History of falling: Secondary | ICD-10-CM | POA: Insufficient documentation

## 2015-11-14 NOTE — Assessment & Plan Note (Signed)
Refer to home health for PT and evaluation of home for fall risk

## 2015-11-16 ENCOUNTER — Telehealth: Payer: Self-pay | Admitting: *Deleted

## 2015-11-16 NOTE — Telephone Encounter (Signed)
Received fax from [facility] for Delay of Care notice; forwarded to provider/SLS

## 2015-11-17 ENCOUNTER — Telehealth: Payer: Self-pay | Admitting: Family Medicine

## 2015-11-17 DIAGNOSIS — G894 Chronic pain syndrome: Secondary | ICD-10-CM | POA: Diagnosis not present

## 2015-11-17 DIAGNOSIS — M25551 Pain in right hip: Secondary | ICD-10-CM | POA: Diagnosis not present

## 2015-11-17 DIAGNOSIS — I10 Essential (primary) hypertension: Secondary | ICD-10-CM | POA: Diagnosis not present

## 2015-11-17 DIAGNOSIS — E785 Hyperlipidemia, unspecified: Secondary | ICD-10-CM | POA: Diagnosis not present

## 2015-11-17 NOTE — Telephone Encounter (Signed)
[facility] called to inform Dr. Etter Sjogren that patient was seen today by PT and they are planning to see patient twice a week for 8 weeks for strengthening, balancing and gait training. Need order.

## 2015-11-17 NOTE — Telephone Encounter (Signed)
Ok to give order? 

## 2015-11-17 NOTE — Telephone Encounter (Signed)
To MD for FYI.       KP 

## 2015-11-17 NOTE — Telephone Encounter (Signed)
[name]: Zigmund Daniel with [facility] Can be reached: [phone]  Reason for call: Update on [facility] - they were not able to see the pt within 24-48 hours from the referral, they are opening the patient today

## 2015-11-18 NOTE — Telephone Encounter (Signed)
Verbal left on Vm for PT.     KP

## 2015-11-18 NOTE — Telephone Encounter (Signed)
Signed form faxed successfully. Sent for scanning. JG//CMA

## 2015-11-20 DIAGNOSIS — M25551 Pain in right hip: Secondary | ICD-10-CM | POA: Diagnosis not present

## 2015-11-20 DIAGNOSIS — I10 Essential (primary) hypertension: Secondary | ICD-10-CM | POA: Diagnosis not present

## 2015-11-20 DIAGNOSIS — E785 Hyperlipidemia, unspecified: Secondary | ICD-10-CM | POA: Diagnosis not present

## 2015-11-20 DIAGNOSIS — G894 Chronic pain syndrome: Secondary | ICD-10-CM | POA: Diagnosis not present

## 2015-11-24 DIAGNOSIS — I10 Essential (primary) hypertension: Secondary | ICD-10-CM | POA: Diagnosis not present

## 2015-11-24 DIAGNOSIS — M25551 Pain in right hip: Secondary | ICD-10-CM | POA: Diagnosis not present

## 2015-11-24 DIAGNOSIS — E785 Hyperlipidemia, unspecified: Secondary | ICD-10-CM | POA: Diagnosis not present

## 2015-11-24 DIAGNOSIS — G894 Chronic pain syndrome: Secondary | ICD-10-CM | POA: Diagnosis not present

## 2015-11-26 DIAGNOSIS — G894 Chronic pain syndrome: Secondary | ICD-10-CM | POA: Diagnosis not present

## 2015-11-26 DIAGNOSIS — E785 Hyperlipidemia, unspecified: Secondary | ICD-10-CM | POA: Diagnosis not present

## 2015-11-26 DIAGNOSIS — M25551 Pain in right hip: Secondary | ICD-10-CM | POA: Diagnosis not present

## 2015-11-26 DIAGNOSIS — I10 Essential (primary) hypertension: Secondary | ICD-10-CM | POA: Diagnosis not present

## 2015-11-30 ENCOUNTER — Telehealth: Payer: Self-pay | Admitting: Family Medicine

## 2015-11-30 DIAGNOSIS — G894 Chronic pain syndrome: Secondary | ICD-10-CM

## 2015-11-30 NOTE — Telephone Encounter (Signed)
Relation to [id number] Call back number:[phone]   Reason for call:  patient would like to discuss simvastatin (ZOCOR) 80 MG tablet and tramadol with Maudie Mercury. Patient received medication the other day but tramadol was not delivered and patient states [facility] wanted to know why she stopped taking simvastatin and she tried to explain but they didn't understand. Patient asked to speak with Maudie Mercury.

## 2015-12-01 ENCOUNTER — Telehealth: Payer: Self-pay | Admitting: *Deleted

## 2015-12-01 DIAGNOSIS — I10 Essential (primary) hypertension: Secondary | ICD-10-CM | POA: Diagnosis not present

## 2015-12-01 DIAGNOSIS — M25551 Pain in right hip: Secondary | ICD-10-CM | POA: Diagnosis not present

## 2015-12-01 DIAGNOSIS — G894 Chronic pain syndrome: Secondary | ICD-10-CM | POA: Diagnosis not present

## 2015-12-01 DIAGNOSIS — E785 Hyperlipidemia, unspecified: Secondary | ICD-10-CM | POA: Diagnosis not present

## 2015-12-01 MED ORDER — HYDROCODONE-ACETAMINOPHEN 5-325 MG PO TABS: 1.0000 | ORAL_TABLET | Freq: Four times a day (QID) | ORAL | Status: DC | PRN

## 2015-12-01 NOTE — Telephone Encounter (Signed)
Ok to refill x 1  

## 2015-12-01 NOTE — Telephone Encounter (Signed)
She is requesting Hydrocodone. Tramadol was sent.     KP

## 2015-12-01 NOTE — Telephone Encounter (Signed)
Spoke with patient and she said the insurance company asked why she has not filled the Zocor 80 and she told them she stopped taking it. She stated she has not received her Tramadol yet and wanted to get a refill on the hydrocodone. She said she normally gets 90 but only got 45 the last time.   Hydrocodone It was filled on 11/12/15 #45  Normally get #90 please advise     KP

## 2015-12-01 NOTE — Telephone Encounter (Signed)
Forms received from [facility] and Plan of Care; forwarded to provider/SLS 01/24

## 2015-12-01 NOTE — Telephone Encounter (Signed)
Patient aware Rx ready for pick up.      KP 

## 2015-12-01 NOTE — Telephone Encounter (Signed)
Refill tramadol #90

## 2015-12-03 DIAGNOSIS — G894 Chronic pain syndrome: Secondary | ICD-10-CM | POA: Diagnosis not present

## 2015-12-03 DIAGNOSIS — M170 Bilateral primary osteoarthritis of knee: Secondary | ICD-10-CM | POA: Diagnosis not present

## 2015-12-03 DIAGNOSIS — M25551 Pain in right hip: Secondary | ICD-10-CM | POA: Diagnosis not present

## 2015-12-03 DIAGNOSIS — I10 Essential (primary) hypertension: Secondary | ICD-10-CM | POA: Diagnosis not present

## 2015-12-03 DIAGNOSIS — E785 Hyperlipidemia, unspecified: Secondary | ICD-10-CM | POA: Diagnosis not present

## 2015-12-03 MED FILL — HYDROCODON-APAP 5-325: 5-325 | 23 days supply | Qty: 90 | Fill #0

## 2015-12-08 DIAGNOSIS — I10 Essential (primary) hypertension: Secondary | ICD-10-CM | POA: Diagnosis not present

## 2015-12-08 DIAGNOSIS — M25551 Pain in right hip: Secondary | ICD-10-CM | POA: Diagnosis not present

## 2015-12-08 DIAGNOSIS — E785 Hyperlipidemia, unspecified: Secondary | ICD-10-CM | POA: Diagnosis not present

## 2015-12-08 DIAGNOSIS — G894 Chronic pain syndrome: Secondary | ICD-10-CM | POA: Diagnosis not present

## 2015-12-10 DIAGNOSIS — E785 Hyperlipidemia, unspecified: Secondary | ICD-10-CM | POA: Diagnosis not present

## 2015-12-10 DIAGNOSIS — I10 Essential (primary) hypertension: Secondary | ICD-10-CM | POA: Diagnosis not present

## 2015-12-10 DIAGNOSIS — M25551 Pain in right hip: Secondary | ICD-10-CM | POA: Diagnosis not present

## 2015-12-10 DIAGNOSIS — G894 Chronic pain syndrome: Secondary | ICD-10-CM | POA: Diagnosis not present

## 2015-12-14 NOTE — Telephone Encounter (Signed)
Completed paperwork faxed, copy to scan, copy to billing/SLS 01/30

## 2015-12-15 DIAGNOSIS — I10 Essential (primary) hypertension: Secondary | ICD-10-CM | POA: Diagnosis not present

## 2015-12-15 DIAGNOSIS — G894 Chronic pain syndrome: Secondary | ICD-10-CM | POA: Diagnosis not present

## 2015-12-15 DIAGNOSIS — M25551 Pain in right hip: Secondary | ICD-10-CM | POA: Diagnosis not present

## 2015-12-15 DIAGNOSIS — E785 Hyperlipidemia, unspecified: Secondary | ICD-10-CM | POA: Diagnosis not present

## 2015-12-17 DIAGNOSIS — E785 Hyperlipidemia, unspecified: Secondary | ICD-10-CM | POA: Diagnosis not present

## 2015-12-17 DIAGNOSIS — I10 Essential (primary) hypertension: Secondary | ICD-10-CM | POA: Diagnosis not present

## 2015-12-17 DIAGNOSIS — M25551 Pain in right hip: Secondary | ICD-10-CM | POA: Diagnosis not present

## 2015-12-17 DIAGNOSIS — G894 Chronic pain syndrome: Secondary | ICD-10-CM | POA: Diagnosis not present

## 2015-12-22 DIAGNOSIS — M25551 Pain in right hip: Secondary | ICD-10-CM | POA: Diagnosis not present

## 2015-12-22 DIAGNOSIS — I10 Essential (primary) hypertension: Secondary | ICD-10-CM | POA: Diagnosis not present

## 2015-12-22 DIAGNOSIS — E785 Hyperlipidemia, unspecified: Secondary | ICD-10-CM | POA: Diagnosis not present

## 2015-12-22 DIAGNOSIS — G894 Chronic pain syndrome: Secondary | ICD-10-CM | POA: Diagnosis not present

## 2015-12-24 DIAGNOSIS — G894 Chronic pain syndrome: Secondary | ICD-10-CM | POA: Diagnosis not present

## 2015-12-24 DIAGNOSIS — E785 Hyperlipidemia, unspecified: Secondary | ICD-10-CM | POA: Diagnosis not present

## 2015-12-24 DIAGNOSIS — M25551 Pain in right hip: Secondary | ICD-10-CM | POA: Diagnosis not present

## 2015-12-24 DIAGNOSIS — I10 Essential (primary) hypertension: Secondary | ICD-10-CM | POA: Diagnosis not present

## 2015-12-29 ENCOUNTER — Telehealth: Payer: Self-pay | Admitting: Family Medicine

## 2015-12-29 ENCOUNTER — Telehealth: Payer: Self-pay | Admitting: *Deleted

## 2015-12-29 DIAGNOSIS — G894 Chronic pain syndrome: Secondary | ICD-10-CM | POA: Diagnosis not present

## 2015-12-29 DIAGNOSIS — E785 Hyperlipidemia, unspecified: Secondary | ICD-10-CM | POA: Diagnosis not present

## 2015-12-29 DIAGNOSIS — I10 Essential (primary) hypertension: Secondary | ICD-10-CM | POA: Diagnosis not present

## 2015-12-29 DIAGNOSIS — M25551 Pain in right hip: Secondary | ICD-10-CM | POA: Diagnosis not present

## 2015-12-29 MED ORDER — HYDROCODONE-ACETAMINOPHEN 5-325 MG PO TABS: 1.0000 | ORAL_TABLET | Freq: Four times a day (QID) | ORAL | Status: DC | PRN

## 2015-12-29 NOTE — Telephone Encounter (Signed)
Refill x1 

## 2015-12-29 NOTE — Telephone Encounter (Signed)
Pt already called to cancel Medicare Wellness Visit.

## 2015-12-29 NOTE — Telephone Encounter (Signed)
Patient aware Rx ready for pick up.      KP 

## 2015-12-29 NOTE — Addendum Note (Signed)
Addended by: Ewing Schlein on: 12/29/2015 05:45 PM   Modules accepted: Orders

## 2015-12-29 NOTE — Telephone Encounter (Signed)
[name]: Self  Can be reached: [phone]   Reason for call: Refill on HYDROcodone-acetaminophen (NORCO/VICODIN) 5-325 MG tablet [id number]

## 2015-12-29 NOTE — Telephone Encounter (Signed)
Last seen 11/11/25 and filled 12/01/15 #90   Please advise    KP

## 2015-12-30 ENCOUNTER — Ambulatory Visit: Payer: Medicare Other

## 2015-12-30 MED FILL — HYDROCODON-APAP 5-325: 5-325 | 23 days supply | Qty: 90 | Fill #0

## 2015-12-31 DIAGNOSIS — G894 Chronic pain syndrome: Secondary | ICD-10-CM | POA: Diagnosis not present

## 2015-12-31 DIAGNOSIS — E785 Hyperlipidemia, unspecified: Secondary | ICD-10-CM | POA: Diagnosis not present

## 2015-12-31 DIAGNOSIS — M25551 Pain in right hip: Secondary | ICD-10-CM | POA: Diagnosis not present

## 2015-12-31 DIAGNOSIS — I10 Essential (primary) hypertension: Secondary | ICD-10-CM | POA: Diagnosis not present

## 2016-01-03 DIAGNOSIS — M170 Bilateral primary osteoarthritis of knee: Secondary | ICD-10-CM | POA: Diagnosis not present

## 2016-01-05 DIAGNOSIS — Z7982 Long term (current) use of aspirin: Secondary | ICD-10-CM | POA: Diagnosis not present

## 2016-01-05 DIAGNOSIS — Z9181 History of falling: Secondary | ICD-10-CM | POA: Diagnosis not present

## 2016-01-05 DIAGNOSIS — E785 Hyperlipidemia, unspecified: Secondary | ICD-10-CM | POA: Diagnosis not present

## 2016-01-05 DIAGNOSIS — I10 Essential (primary) hypertension: Secondary | ICD-10-CM | POA: Diagnosis not present

## 2016-01-05 DIAGNOSIS — M25551 Pain in right hip: Secondary | ICD-10-CM | POA: Diagnosis not present

## 2016-01-05 DIAGNOSIS — Z79891 Long term (current) use of opiate analgesic: Secondary | ICD-10-CM | POA: Diagnosis not present

## 2016-01-05 DIAGNOSIS — G894 Chronic pain syndrome: Secondary | ICD-10-CM | POA: Diagnosis not present

## 2016-01-07 DIAGNOSIS — I10 Essential (primary) hypertension: Secondary | ICD-10-CM | POA: Diagnosis not present

## 2016-01-07 DIAGNOSIS — E785 Hyperlipidemia, unspecified: Secondary | ICD-10-CM | POA: Diagnosis not present

## 2016-01-07 DIAGNOSIS — M25551 Pain in right hip: Secondary | ICD-10-CM | POA: Diagnosis not present

## 2016-01-07 DIAGNOSIS — G894 Chronic pain syndrome: Secondary | ICD-10-CM | POA: Diagnosis not present

## 2016-01-07 DIAGNOSIS — Z79891 Long term (current) use of opiate analgesic: Secondary | ICD-10-CM | POA: Diagnosis not present

## 2016-01-07 DIAGNOSIS — Z9181 History of falling: Secondary | ICD-10-CM | POA: Diagnosis not present

## 2016-01-07 DIAGNOSIS — Z7982 Long term (current) use of aspirin: Secondary | ICD-10-CM | POA: Diagnosis not present

## 2016-01-12 ENCOUNTER — Telehealth: Payer: Self-pay | Admitting: *Deleted

## 2016-01-12 NOTE — Telephone Encounter (Signed)
Received PT Discharge Summary; forwarded to provider/SLS 03/07

## 2016-01-13 ENCOUNTER — Other Ambulatory Visit: Payer: Self-pay | Admitting: Family Medicine

## 2016-01-25 ENCOUNTER — Telehealth: Payer: Self-pay | Admitting: Family Medicine

## 2016-01-25 DIAGNOSIS — G894 Chronic pain syndrome: Secondary | ICD-10-CM

## 2016-01-25 MED ORDER — HYDROCODONE-ACETAMINOPHEN 5-325 MG PO TABS: 1.0000 | ORAL_TABLET | Freq: Four times a day (QID) | ORAL | Status: DC | PRN

## 2016-01-25 MED ORDER — TRAMADOL HCL 50 MG PO TABS: 50.0000 mg | ORAL_TABLET | Freq: Every day | ORAL | Status: DC

## 2016-01-25 NOTE — Telephone Encounter (Signed)
Last OV 11/12/15 Hydrocodone last filled 12/29/15 #90 with 0  CSC, low risk, Due for UDS

## 2016-01-25 NOTE — Telephone Encounter (Signed)
Patient has been made aware, she will have her son pick up the Rx.     KP  She is requesting the Tramadol, she alternates it. Last filled 11/12/15 #90 she would like it sent to [facility].   Please advise    KP

## 2016-01-25 NOTE — Telephone Encounter (Signed)
Refill x1--- - can post date 2 refills

## 2016-01-25 NOTE — Telephone Encounter (Signed)
Pt is requesting a refill on her HYDROcodone Rx.   CB: [phone]

## 2016-01-25 NOTE — Telephone Encounter (Signed)
Rx faxed.    KP 

## 2016-01-25 NOTE — Telephone Encounter (Signed)
Refill x1 

## 2016-01-26 MED FILL — HYDROCODON-APAP 5-325: 5-325 | 22 days supply | Qty: 90 | Fill #0

## 2016-01-31 DIAGNOSIS — M170 Bilateral primary osteoarthritis of knee: Secondary | ICD-10-CM | POA: Diagnosis not present

## 2016-02-11 ENCOUNTER — Ambulatory Visit (HOSPITAL_BASED_OUTPATIENT_CLINIC_OR_DEPARTMENT_OTHER)
Admission: RE | Admit: 2016-02-11 | Discharge: 2016-02-11 | Disposition: A | Discharge status: Home / Self Care | Payer: Medicare Other | Source: Ambulatory Visit | Attending: Family Medicine | Admitting: Family Medicine

## 2016-02-11 ENCOUNTER — Ambulatory Visit (INDEPENDENT_AMBULATORY_CARE_PROVIDER_SITE_OTHER): Payer: Medicare Other | Admitting: Family Medicine

## 2016-02-11 VITALS — BP 124/68 | HR 69 | Temp 98.0°F

## 2016-02-11 DIAGNOSIS — R6889 Other general symptoms and signs: Secondary | ICD-10-CM

## 2016-02-11 DIAGNOSIS — R350 Frequency of micturition: Secondary | ICD-10-CM | POA: Diagnosis not present

## 2016-02-11 DIAGNOSIS — M25552 Pain in left hip: Secondary | ICD-10-CM | POA: Diagnosis not present

## 2016-02-11 DIAGNOSIS — M170 Bilateral primary osteoarthritis of knee: Secondary | ICD-10-CM

## 2016-02-11 DIAGNOSIS — M1990 Unspecified osteoarthritis, unspecified site: Secondary | ICD-10-CM | POA: Diagnosis not present

## 2016-02-11 DIAGNOSIS — R296 Repeated falls: Secondary | ICD-10-CM | POA: Diagnosis not present

## 2016-02-11 DIAGNOSIS — M25551 Pain in right hip: Secondary | ICD-10-CM

## 2016-02-11 LAB — COMPREHENSIVE METABOLIC PANEL
ALBUMIN: 3.5 g/dL (ref 3.5–5.2)
ALK PHOS: 52 U/L (ref 39–117)
ALT: 9 U/L (ref 0–35)
AST: 13 U/L (ref 0–37)
BILIRUBIN TOTAL: 0.4 mg/dL (ref 0.2–1.2)
BUN: 17 mg/dL (ref 6–23)
CO2: 33 mEq/L — ABNORMAL HIGH (ref 19–32)
Calcium: 9.2 mg/dL (ref 8.4–10.5)
Chloride: 104 mEq/L (ref 96–112)
Creatinine, Ser: 0.74 mg/dL (ref 0.40–1.20)
GFR: 79.55 mL/min (ref >60.00)
Glucose, Bld: 83 mg/dL (ref 70–99)
Potassium: 4 mEq/L (ref 3.5–5.1)
SODIUM: 141 meq/L (ref 135–145)
TOTAL PROTEIN: 6.6 g/dL (ref 6.0–8.3)

## 2016-02-11 LAB — POC URINALSYSI DIPSTICK (AUTOMATED)
Bilirubin, UA: NEGATIVE
Blood, UA: NEGATIVE
GLUCOSE UA: NEGATIVE
KETONES UA: NEGATIVE
LEUKOCYTES UA: NEGATIVE
Nitrite, UA: NEGATIVE
Protein, UA: NEGATIVE
Spec Grav, UA: 1.025
Urobilinogen, UA: 2
pH, UA: 5

## 2016-02-11 LAB — CBC WITH DIFFERENTIAL/PLATELET
BASOS ABS: 0 10*3/uL (ref 0.0–0.1)
Basophils Relative: 0.6 % (ref 0.0–3.0)
EOS ABS: 0.1 10*3/uL (ref 0.0–0.7)
EOS PCT: 2.4 % (ref 0.0–5.0)
HCT: 38 % (ref 36.0–46.0)
HEMOGLOBIN: 12.8 g/dL (ref 12.0–15.0)
LYMPHS ABS: 1 10*3/uL (ref 0.7–4.0)
Lymphocytes Relative: 19.4 % (ref 12.0–46.0)
MCHC: 33.5 g/dL (ref 30.0–36.0)
MCV: 96.4 fl (ref 78.0–100.0)
MONO ABS: 0.8 10*3/uL (ref 0.1–1.0)
Monocytes Relative: 16.5 % — ABNORMAL HIGH (ref 3.0–12.0)
NEUTROS PCT: 61.1 % (ref 43.0–77.0)
Neutro Abs: 3.1 10*3/uL (ref 1.4–7.7)
Platelets: 165 10*3/uL (ref 150.0–400.0)
RBC: 3.95 Mil/uL (ref 3.87–5.11)
RDW: 14.5 % (ref 11.5–15.5)
WBC: 5 10*3/uL (ref 4.0–10.5)

## 2016-02-11 LAB — TSH: TSH: 2.06 u[IU]/mL (ref 0.35–4.50)

## 2016-02-11 MED ORDER — NONFORMULARY OR COMPOUNDED ITEM: Status: DC

## 2016-02-11 NOTE — Progress Notes (Signed)
Pre visit review using our clinic review tool, if applicable. No additional management support is needed unless otherwise documented below in the visit note. 

## 2016-02-11 NOTE — Patient Instructions (Signed)
Fall Prevention in the Home  Falls can cause injuries and can affect people from all age groups. There are many simple things that you can do to make your home safe and to help prevent falls. WHAT CAN I DO ON THE OUTSIDE OF MY HOME?  Regularly repair the edges of walkways and driveways and fix any cracks.  Remove high doorway thresholds.  Trim any shrubbery on the main path into your home.  Use bright outdoor lighting.  Clear walkways of debris and clutter, including tools and rocks.  Regularly check that handrails are securely fastened and in good repair. Both sides of any steps should have handrails.  Install guardrails along the edges of any raised decks or porches.  Have leaves, snow, and ice cleared regularly.  Use sand or salt on walkways during winter months.  In the garage, clean up any spills right away, including grease or oil spills. WHAT CAN I DO IN THE BATHROOM?  Use night lights.  Install grab bars by the toilet and in the tub and shower. Do not use towel bars as grab bars.  Use non-skid mats or decals on the floor of the tub or shower.  If you need to sit down while you are in the shower, use a plastic, non-slip stool..  Keep the floor dry. Immediately clean up any water that spills on the floor.  Remove soap buildup in the tub or shower on a regular basis.  Attach bath mats securely with double-sided non-slip rug tape.  Remove throw rugs and other tripping hazards from the floor. WHAT CAN I DO IN THE BEDROOM?  Use night lights.  Make sure that a bedside light is easy to reach.  Do not use oversized bedding that drapes onto the floor.  Have a firm chair that has side arms to use for getting dressed.  Remove throw rugs and other tripping hazards from the floor. WHAT CAN I DO IN THE KITCHEN?   Clean up any spills right away.  Avoid walking on wet floors.  Place frequently used items in easy-to-reach places.  If you need to reach for something  above you, use a sturdy step stool that has a grab bar.  Keep electrical cables out of the way.  Do not use floor polish or wax that makes floors slippery. If you have to use wax, make sure that it is non-skid floor wax.  Remove throw rugs and other tripping hazards from the floor. WHAT CAN I DO IN THE STAIRWAYS?  Do not leave any items on the stairs.  Make sure that there are handrails on both sides of the stairs. Fix handrails that are broken or loose. Make sure that handrails are as long as the stairways.  Check any carpeting to make sure that it is firmly attached to the stairs. Fix any carpet that is loose or worn.  Avoid having throw rugs at the top or bottom of stairways, or secure the rugs with carpet tape to prevent them from moving.  Make sure that you have a light switch at the top of the stairs and the bottom of the stairs. If you do not have them, have them installed. WHAT ARE SOME OTHER FALL PREVENTION TIPS?  Wear closed-toe shoes that fit well and support your feet. Wear shoes that have rubber soles or low heels.  When you use a stepladder, make sure that it is completely opened and that the sides are firmly locked. Have someone hold the ladder while you   are using it. Do not climb a closed stepladder.  Add color or contrast paint or tape to grab bars and handrails in your home. Place contrasting color strips on the first and last steps.  Use mobility aids as needed, such as canes, walkers, scooters, and crutches.  Turn on lights if it is dark. Replace any light bulbs that burn out.  Set up furniture so that there are clear paths. Keep the furniture in the same spot.  Fix any uneven floor surfaces.  Choose a carpet design that does not hide the edge of steps of a stairway.  Be aware of any and all pets.  Review your medicines with your healthcare provider. Some medicines can cause dizziness or changes in blood pressure, which increase your risk of falling. Talk  with your health care provider about other ways that you can decrease your risk of falls. This may include working with a physical therapist or trainer to improve your strength, balance, and endurance.   This information is not intended to replace advice given to you by your health care provider. Make sure you discuss any questions you have with your health care provider.   Document Released: 10/14/2002 Document Revised: 03/10/2015 Document Reviewed: 11/28/2014 Elsevier Interactive Patient Education 2016 Elsevier Inc.  

## 2016-02-11 NOTE — Progress Notes (Signed)
Patient ID: Norma Ford, female    DOB: [birth year]  Age: 82 y.o. MRN: [id number]    Subjective:  Subjective HPI Norma Ford presents for frequent falls and f/u Pt.   Pt slid off bed and onto fall.    Review of Systems  Constitutional: Negative for diaphoresis, appetite change, fatigue and unexpected weight change.  Eyes: Negative for pain, redness and visual disturbance.  Respiratory: Negative for cough, chest tightness, shortness of breath and wheezing.   Cardiovascular: Negative for chest pain, palpitations and leg swelling.  Endocrine: Negative for cold intolerance, heat intolerance, polydipsia, polyphagia and polyuria.  Genitourinary: Negative for dysuria, frequency and difficulty urinating.  Neurological: Negative for dizziness, light-headedness, numbness and headaches.    History Past Medical History  Diagnosis Date  . Hyperlipidemia   . HTN (hypertension)   . Cataracts, bilateral   . History of abnormal mammogram     2001-h/o abnormal mammo-told just calcium deposit-repeat  . 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 Kidney stone   . Barrett's esophagus 1999  . THROMBOCYTOPENIA 12/21/2009  . MYCOSIS FUNGOIDES LYMPH NODES MULTIPLE SITES 09/21/2009  . DECUBITUS ULCER, BUTTOCK 12/21/2009    She has past surgical history that includes Total knee arthroplasty; Esophagogastroduodenoscopy (03/2002); tubal ligation; Abdominal hysterectomy; Tonsillectomy; and appendectomy.   Her family history includes Heart disease in her father and mother; Kidney disease in her father.She reports that she has never smoked. She has never used smokeless tobacco. Her alcohol and drug histories are not on file.  Current Outpatient Prescriptions on File Prior to Visit  Medication Sig Dispense Refill  . clotrimazole-betamethasone (LOTRISONE) cream APPLY ONE APPLICATION TOPICALLY TWO TIMES DAILY 45 g 0  . furosemide (LASIX) 40 MG tablet Take 1 tablet by mouth two  times daily 180  tablet 1  . HYDROcodone-acetaminophen (NORCO/VICODIN) 5-325 MG tablet Take 1 tablet by mouth every 6 (six) hours as needed for moderate pain. 90 tablet 0  . HYDROcodone-acetaminophen (NORCO/VICODIN) 5-325 MG tablet Take 1 tablet by mouth every 6 (six) hours as needed for moderate pain. 90 tablet 0  . HYDROcodone-acetaminophen (NORCO/VICODIN) 5-325 MG tablet Take 1 tablet by mouth every 6 (six) hours as needed for moderate pain. 90 tablet 0  . losartan (COZAAR) 50 MG tablet Take 1 tablet (50 mg total) by mouth daily. 90 tablet 3  . metoprolol succinate (TOPROL-XL) 25 MG 24 hr tablet Take 1 tablet (25 mg total) by mouth daily. 90 tablet 3  . Misc. Devices (COMMODE BEDSIDE) MISC Bariatric Bedside commode 1 each 0  . mometasone (NASONEX) 50 MCG/ACT nasal spray Place 2 sprays into the nose daily.      . Multiple Vitamin (MULTIVITAMIN) tablet Take 1 tablet by mouth daily.      . polyethylene glycol powder (GLYCOLAX/MIRALAX) powder Take 17 g by mouth 2 (two) times daily as needed. 3350 g 3  . potassium chloride SA (K-DUR,KLOR-CON) 20 MEQ tablet Take 1 tablet by mouth two  times daily 180 tablet 0  . sertraline (ZOLOFT) 50 MG tablet Take 1 tablet by mouth  daily 90 tablet 1  . simvastatin (ZOCOR) 80 MG tablet Take 1 tablet by mouth at  bedtime 90 tablet 1  . traMADol (ULTRAM) 50 MG tablet Take 1 tablet (50 mg total) by mouth daily. 90 tablet 0   No current facility-administered medications on file prior to visit.     Objective:  Objective Physical Exam  Constitutional: She is oriented to person, place, and  time. She appears well-developed and well-nourished.  HENT:  Head: Normocephalic and atraumatic.  Eyes: Conjunctivae and EOM are normal.  Neck: Normal range of motion. Neck supple. No JVD present. Carotid bruit is not present. No thyromegaly present.  Cardiovascular: Normal rate, regular rhythm and normal heart sounds.   No murmur heard. Pulmonary/Chest: Effort normal and breath sounds normal.  No respiratory distress. She has no wheezes. She has no rales. She exhibits no tenderness.  Musculoskeletal: She exhibits no edema.  Neurological: She is alert and oriented to person, place, and time.  Psychiatric: She has a normal mood and affect. Her behavior is normal. Judgment and thought content normal.  Nursing note and vitals reviewed.  BP 124/68 mmHg  Pulse 69  Temp(Src) 98 F (36.7 C) (Oral)  SpO2 96% Wt Readings from Last 3 Encounters:  04/17/15 287 lb 3.2 oz (130.273 kg)  11/10/14 296 lb 6.4 oz (134.446 kg)  02/24/14 277 lb (125.646 kg)     Lab Results  Component Value Date   WBC 5.9 02/24/2014   HGB 13.6 02/24/2014   HCT 41.3 02/24/2014   PLT 172.0 02/24/2014   GLUCOSE 75 10/22/2015   CHOL 183 10/22/2015   TRIG 146.0 10/22/2015   HDL 42.20 10/22/2015   LDLCALC 111* 10/22/2015   ALT 10 10/22/2015   AST 14 10/22/2015   NA 141 10/22/2015   K 4.2 10/22/2015   CL 103 10/22/2015   CREATININE 0.69 10/22/2015   BUN 15 10/22/2015   CO2 33* 10/22/2015   TSH 1.75 09/21/2009   MICROALBUR 0.7 11/10/2014    Dg Chest 2 View  11/10/2014  CLINICAL DATA:  One-month history of cough EXAM: CHEST  2 VIEW COMPARISON:  PA and lateral chest x-ray of October 05, 2011 FINDINGS: The lungs are slightly less well inflated today. There is stable scarring at both lung bases. There is no pleural effusion or alveolar infiltrate. The cardiac silhouette remains mildly enlarged. The pulmonary vascularity is not engorged. The trachea is midline. There is degenerative change of both shoulders. IMPRESSION: There is no active cardiopulmonary disease. There is bibasilar scarring. Electronically Signed   By: David  Martinique   On: 11/10/2014 16:29     Assessment & Plan:  Plan I am having Ms. Burandt maintain her multivitamin, mometasone, clotrimazole-betamethasone, Commode Bedside, metoprolol succinate, polyethylene glycol powder, simvastatin, sertraline, furosemide, losartan, potassium chloride SA,  HYDROcodone-acetaminophen, HYDROcodone-acetaminophen, HYDROcodone-acetaminophen, and traMADol.  No orders of the defined types were placed in this encounter.    Problem List Items Addressed This Visit    None    Visit Diagnoses    Urine frequency    -  Primary    Relevant Orders    POCT Urinalysis Dipstick (Automated) (Completed)    Comprehensive metabolic panel    CBC with Differential/Platelet    Comprehensive metabolic panel    Ambulatory referral to [place]    Frequent falls        Relevant Orders    Comprehensive metabolic panel    CBC with Differential/Platelet    Comprehensive metabolic panel    Ambulatory referral to [place]    Cold intolerance        Relevant Orders    TSH    Ambulatory referral to Home Health       Follow-up: No Follow-up on file.  Ann Held, DO

## 2016-02-12 ENCOUNTER — Telehealth: Payer: Self-pay | Admitting: Family Medicine

## 2016-02-12 NOTE — Telephone Encounter (Signed)
Norma Ford is aware the X-ray was normal and I would call back with the rest when they have been resulted.    KP

## 2016-02-12 NOTE — Telephone Encounter (Signed)
Pt's daughter in law Lucita Ferrara called in to request lab results.    CB: [phone]

## 2016-02-14 ENCOUNTER — Encounter: Payer: Self-pay | Admitting: Family Medicine

## 2016-02-14 DIAGNOSIS — R296 Repeated falls: Secondary | ICD-10-CM | POA: Insufficient documentation

## 2016-02-14 NOTE — Assessment & Plan Note (Signed)
con't pain meds prn

## 2016-02-14 NOTE — Progress Notes (Signed)
Patient ID: Norma Ford, female    DOB: February 15, 1930  Age: 82 y.o. MRN: [id number]    Subjective:  Subjective HPI Norma Ford presents for fatigue, frequent falls and urinary frequency.  No dysuria, no fevers.   Review of Systems  Constitutional: Positive for fatigue. Negative for diaphoresis, appetite change and unexpected weight change.  Eyes: Negative for pain, redness and visual disturbance.  Respiratory: Negative for cough, chest tightness, shortness of breath and wheezing.   Cardiovascular: Negative for chest pain, palpitations and leg swelling.  Endocrine: Negative for cold intolerance, heat intolerance, polydipsia, polyphagia and polyuria.  Genitourinary: Positive for frequency. Negative for dysuria and difficulty urinating.  Neurological: Negative for dizziness, light-headedness, numbness and headaches.    History Past Medical History  Diagnosis Date  . Hyperlipidemia   . HTN (hypertension)   . Cataracts, bilateral   . History of abnormal mammogram     2001-h/o abnormal mammo-told just calcium deposit-repeat  . 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 Kidney stone   . Barrett's esophagus 1999  . THROMBOCYTOPENIA 12/21/2009  . MYCOSIS FUNGOIDES LYMPH NODES MULTIPLE SITES 09/21/2009  . DECUBITUS ULCER, BUTTOCK 12/21/2009    She has past surgical history that includes Total knee arthroplasty; Esophagogastroduodenoscopy (03/2002); tubal ligation; Abdominal hysterectomy; Tonsillectomy; and appendectomy.   Her family history includes Heart disease in her father and mother; Kidney disease in her father.She reports that she has never smoked. She has never used smokeless tobacco. Her alcohol and drug histories are not on file.  Current Outpatient Prescriptions on File Prior to Visit  Medication Sig Dispense Refill  . clotrimazole-betamethasone (LOTRISONE) cream APPLY ONE APPLICATION TOPICALLY TWO TIMES DAILY 45 g 0  . furosemide (LASIX) 40 MG tablet Take 1 tablet  by mouth two  times daily 180 tablet 1  . HYDROcodone-acetaminophen (NORCO/VICODIN) 5-325 MG tablet Take 1 tablet by mouth every 6 (six) hours as needed for moderate pain. 90 tablet 0  . HYDROcodone-acetaminophen (NORCO/VICODIN) 5-325 MG tablet Take 1 tablet by mouth every 6 (six) hours as needed for moderate pain. 90 tablet 0  . HYDROcodone-acetaminophen (NORCO/VICODIN) 5-325 MG tablet Take 1 tablet by mouth every 6 (six) hours as needed for moderate pain. 90 tablet 0  . losartan (COZAAR) 50 MG tablet Take 1 tablet (50 mg total) by mouth daily. 90 tablet 3  . metoprolol succinate (TOPROL-XL) 25 MG 24 hr tablet Take 1 tablet (25 mg total) by mouth daily. 90 tablet 3  . Misc. Devices (COMMODE BEDSIDE) MISC Bariatric Bedside commode 1 each 0  . mometasone (NASONEX) 50 MCG/ACT nasal spray Place 2 sprays into the nose daily.      . Multiple Vitamin (MULTIVITAMIN) tablet Take 1 tablet by mouth daily.      . polyethylene glycol powder (GLYCOLAX/MIRALAX) powder Take 17 g by mouth 2 (two) times daily as needed. 3350 g 3  . potassium chloride SA (K-DUR,KLOR-CON) 20 MEQ tablet Take 1 tablet by mouth two  times daily 180 tablet 0  . sertraline (ZOLOFT) 50 MG tablet Take 1 tablet by mouth  daily 90 tablet 1  . simvastatin (ZOCOR) 80 MG tablet Take 1 tablet by mouth at  bedtime 90 tablet 1  . traMADol (ULTRAM) 50 MG tablet Take 1 tablet (50 mg total) by mouth daily. 90 tablet 0   No current facility-administered medications on file prior to visit.     Objective:  Objective Physical Exam  Constitutional: She is oriented to person, place, and  time. She appears well-developed and well-nourished.  HENT:  Head: Normocephalic and atraumatic.  Eyes: Conjunctivae and EOM are normal.  Neck: Normal range of motion. Neck supple. No JVD present. Carotid bruit is not present. No thyromegaly present.  Cardiovascular: Normal rate, regular rhythm and normal heart sounds.   No murmur heard. Pulmonary/Chest: Effort  normal and breath sounds normal. No respiratory distress. She has no wheezes. She has no rales. She exhibits no tenderness.  Musculoskeletal: She exhibits tenderness. She exhibits no edema.  Neurological: She is alert and oriented to person, place, and time.  Psychiatric: She has a normal mood and affect.  Nursing note and vitals reviewed.  BP 124/68 mmHg  Pulse 69  Temp(Src) 98 F (36.7 C) (Oral)  SpO2 96% Wt Readings from Last 3 Encounters:  04/17/15 287 lb 3.2 oz (130.273 kg)  11/10/14 296 lb 6.4 oz (134.446 kg)  02/24/14 277 lb (125.646 kg)     Lab Results  Component Value Date   WBC 5.0 02/11/2016   HGB 12.8 02/11/2016   HCT 38.0 02/11/2016   PLT 165.0 02/11/2016   GLUCOSE 83 02/11/2016   CHOL 183 10/22/2015   TRIG 146.0 10/22/2015   HDL 42.20 10/22/2015   LDLCALC 111* 10/22/2015   ALT 9 02/11/2016   AST 13 02/11/2016   NA 141 02/11/2016   K 4.0 02/11/2016   CL 104 02/11/2016   CREATININE 0.74 02/11/2016   BUN 17 02/11/2016   CO2 33* 02/11/2016   TSH 2.06 02/11/2016   MICROALBUR 0.7 11/10/2014    Dg Hips Bilat With Pelvis 2v  02/11/2016  CLINICAL DATA:  [place] 1 week ago.  Bilateral hip pain. EXAM: DG HIP (WITH OR WITHOUT PELVIS) 2V BILAT COMPARISON:  None. FINDINGS: No evidence of hip, pelvic or sacral fracture on these views. IMPRESSION: Negative. Electronically Signed   By: Nelson Chimes M.D.   On: 02/11/2016 16:33     Assessment & Plan:  Plan I am having Norma Ford start on NONFORMULARY OR COMPOUNDED ITEM. I am also having her maintain her multivitamin, mometasone, clotrimazole-betamethasone, Commode Bedside, metoprolol succinate, polyethylene glycol powder, simvastatin, sertraline, furosemide, losartan, potassium chloride SA, HYDROcodone-acetaminophen, HYDROcodone-acetaminophen, HYDROcodone-acetaminophen, and traMADol.  Meds ordered this encounter  Medications  . NONFORMULARY OR COMPOUNDED ITEM    Sig: Wheelchair, manual #1  As directed---dx osteoarthritis,  frequent falls    Dispense:  1 each    Refill:  o    Problem List Items Addressed This Visit    None    Visit Diagnoses    Urine frequency    -  Primary    Relevant Medications    NONFORMULARY OR COMPOUNDED ITEM    Other Relevant Orders    POCT Urinalysis Dipstick (Automated) (Completed)    Comprehensive metabolic panel (Completed)    CBC with Differential/Platelet (Completed)    Comprehensive metabolic panel    Ambulatory referral to [place]    Frequent falls        Relevant Medications    NONFORMULARY OR COMPOUNDED ITEM    Other Relevant Orders    Comprehensive metabolic panel (Completed)    CBC with Differential/Platelet (Completed)    Comprehensive metabolic panel    Ambulatory referral to [place]    Cold intolerance        Relevant Medications    NONFORMULARY OR COMPOUNDED ITEM    Other Relevant Orders    TSH (Completed)    Ambulatory referral to [place]    Osteoarthritis, unspecified osteoarthritis type,  unspecified site        Relevant Medications    NONFORMULARY OR COMPOUNDED ITEM       Follow-up: Return if symptoms worsen or fail to improve.  Ann Held, DO

## 2016-02-14 NOTE — Assessment & Plan Note (Signed)
Pt has already had PT Family requesting wheelchair to help get pt around

## 2016-02-15 ENCOUNTER — Telehealth: Payer: Self-pay | Admitting: Family Medicine

## 2016-02-15 NOTE — Telephone Encounter (Signed)
[name]: Lucita Ferrara Relationship to patient: Daughter Can be reached: [phone] Pharmacy:  Reason for call: Request results from labs done 4/6.

## 2016-02-15 NOTE — Telephone Encounter (Signed)
Notes Recorded by Ann Held, DO on 02/14/2016 at 6:23 PM Normal labs  Norma Ford is aware the results were Normal.  KP

## 2016-02-16 DIAGNOSIS — C8408 Mycosis fungoides, lymph nodes of multiple sites: Secondary | ICD-10-CM | POA: Diagnosis not present

## 2016-02-16 DIAGNOSIS — R296 Repeated falls: Secondary | ICD-10-CM | POA: Diagnosis not present

## 2016-02-16 DIAGNOSIS — Z9181 History of falling: Secondary | ICD-10-CM | POA: Diagnosis not present

## 2016-02-16 DIAGNOSIS — I10 Essential (primary) hypertension: Secondary | ICD-10-CM | POA: Diagnosis not present

## 2016-02-16 DIAGNOSIS — M170 Bilateral primary osteoarthritis of knee: Secondary | ICD-10-CM | POA: Diagnosis not present

## 2016-02-16 DIAGNOSIS — M1991 Primary osteoarthritis, unspecified site: Secondary | ICD-10-CM | POA: Diagnosis not present

## 2016-02-16 DIAGNOSIS — Z79891 Long term (current) use of opiate analgesic: Secondary | ICD-10-CM | POA: Diagnosis not present

## 2016-02-17 ENCOUNTER — Telehealth: Payer: Self-pay | Admitting: *Deleted

## 2016-02-17 ENCOUNTER — Telehealth: Payer: Self-pay | Admitting: Family Medicine

## 2016-02-17 NOTE — Telephone Encounter (Signed)
[name]: [facility]  Can be reached: [phone]    Reason for call: [facility] is requesting an order for Nursing 1 time each week for 5 weeks to monitor disease process.,

## 2016-02-17 NOTE — Telephone Encounter (Signed)
Verbal order given pending faxed order.   

## 2016-02-17 NOTE — Telephone Encounter (Signed)
Received via fax and forwarded to Dr. Carollee Herter. JG//CMA

## 2016-02-22 DIAGNOSIS — M170 Bilateral primary osteoarthritis of knee: Secondary | ICD-10-CM | POA: Diagnosis not present

## 2016-02-22 DIAGNOSIS — Z79891 Long term (current) use of opiate analgesic: Secondary | ICD-10-CM | POA: Diagnosis not present

## 2016-02-22 DIAGNOSIS — R296 Repeated falls: Secondary | ICD-10-CM | POA: Diagnosis not present

## 2016-02-22 DIAGNOSIS — I10 Essential (primary) hypertension: Secondary | ICD-10-CM | POA: Diagnosis not present

## 2016-02-22 DIAGNOSIS — M1991 Primary osteoarthritis, unspecified site: Secondary | ICD-10-CM | POA: Diagnosis not present

## 2016-02-22 DIAGNOSIS — C8408 Mycosis fungoides, lymph nodes of multiple sites: Secondary | ICD-10-CM | POA: Diagnosis not present

## 2016-02-22 DIAGNOSIS — Z9181 History of falling: Secondary | ICD-10-CM | POA: Diagnosis not present

## 2016-02-24 ENCOUNTER — Telehealth: Payer: Self-pay | Admitting: Family Medicine

## 2016-02-24 DIAGNOSIS — M170 Bilateral primary osteoarthritis of knee: Secondary | ICD-10-CM | POA: Diagnosis not present

## 2016-02-24 DIAGNOSIS — R296 Repeated falls: Secondary | ICD-10-CM | POA: Diagnosis not present

## 2016-02-24 DIAGNOSIS — Z9181 History of falling: Secondary | ICD-10-CM | POA: Diagnosis not present

## 2016-02-24 DIAGNOSIS — I10 Essential (primary) hypertension: Secondary | ICD-10-CM | POA: Diagnosis not present

## 2016-02-24 DIAGNOSIS — M1991 Primary osteoarthritis, unspecified site: Secondary | ICD-10-CM | POA: Diagnosis not present

## 2016-02-24 DIAGNOSIS — C8408 Mycosis fungoides, lymph nodes of multiple sites: Secondary | ICD-10-CM | POA: Diagnosis not present

## 2016-02-24 DIAGNOSIS — Z79891 Long term (current) use of opiate analgesic: Secondary | ICD-10-CM | POA: Diagnosis not present

## 2016-02-24 NOTE — Telephone Encounter (Signed)
Gave verbal auth to proceed with PT as below.

## 2016-02-24 NOTE — Telephone Encounter (Signed)
[name]: Verdis Frederickson  Relation to pt: PT from [place]  Call back number: [phone]    Reason for call:  Requesting verbal orders for home health PT for twice a week for 6 weeks transferring, balancing and gait training

## 2016-02-25 DIAGNOSIS — Z79891 Long term (current) use of opiate analgesic: Secondary | ICD-10-CM | POA: Diagnosis not present

## 2016-02-25 DIAGNOSIS — Z9181 History of falling: Secondary | ICD-10-CM | POA: Diagnosis not present

## 2016-02-25 DIAGNOSIS — M1991 Primary osteoarthritis, unspecified site: Secondary | ICD-10-CM | POA: Diagnosis not present

## 2016-02-25 DIAGNOSIS — R296 Repeated falls: Secondary | ICD-10-CM | POA: Diagnosis not present

## 2016-02-25 DIAGNOSIS — M170 Bilateral primary osteoarthritis of knee: Secondary | ICD-10-CM | POA: Diagnosis not present

## 2016-02-25 DIAGNOSIS — I10 Essential (primary) hypertension: Secondary | ICD-10-CM | POA: Diagnosis not present

## 2016-02-25 DIAGNOSIS — C8408 Mycosis fungoides, lymph nodes of multiple sites: Secondary | ICD-10-CM | POA: Diagnosis not present

## 2016-02-25 NOTE — Telephone Encounter (Signed)
Forms faxed this morning. Received a second fax requesting additional information, forwarded to Dr. Carollee Herter. JG//CMA

## 2016-02-25 NOTE — Telephone Encounter (Addendum)
[name]: Lucita Ferrara Relationship to patient: daughter-in-law Can be reached: [phone]  Reason for call: Lucita Ferrara states that [facility] faxed in form for manual wheelchair and they need it back signed before they can provide for pt.

## 2016-02-25 NOTE — Telephone Encounter (Signed)
agree

## 2016-02-26 MED ORDER — BARIATRIC ROLLATOR MISC: Status: DC

## 2016-02-26 MED ORDER — NONFORMULARY OR COMPOUNDED ITEM
Status: DC
Start: 2016-02-26 — End: 2016-08-15

## 2016-02-26 MED FILL — HYDROCODON-APAP 5-325: 5-325 | 22 days supply | Qty: 90 | Fill #0

## 2016-02-26 NOTE — Addendum Note (Signed)
Addended by: Ewing Schlein on: 02/26/2016 03:50 PM   Modules accepted: Orders

## 2016-02-26 NOTE — Telephone Encounter (Signed)
Ok to give rx for both

## 2016-02-26 NOTE — Telephone Encounter (Signed)
Please advise      KP 

## 2016-02-26 NOTE — Telephone Encounter (Signed)
Spoke with Norma Ford and she will have her Brother in [place] pick it up when he comes to get the prescription.     KP

## 2016-02-26 NOTE — Telephone Encounter (Signed)
[name]: Lucita Ferrara  Relationship to patient: daughter-in-law  Can be reached: [phone]  Pt cancelled order for the manual wheelchair as PT has recommended a different device. Please send new RX needs sent to [facility] for "walker with seat" and have DX on it. If you call Lucita Ferrara she will come pick up written RX to take to [facility] and pick up the walker. They are also hoping to get pt a lift chair. Please write RX for lift chair also. She will pick that up too.

## 2016-03-01 DIAGNOSIS — C8408 Mycosis fungoides, lymph nodes of multiple sites: Secondary | ICD-10-CM | POA: Diagnosis not present

## 2016-03-01 DIAGNOSIS — M170 Bilateral primary osteoarthritis of knee: Secondary | ICD-10-CM | POA: Diagnosis not present

## 2016-03-01 DIAGNOSIS — Z79891 Long term (current) use of opiate analgesic: Secondary | ICD-10-CM | POA: Diagnosis not present

## 2016-03-01 DIAGNOSIS — M1991 Primary osteoarthritis, unspecified site: Secondary | ICD-10-CM | POA: Diagnosis not present

## 2016-03-01 DIAGNOSIS — R296 Repeated falls: Secondary | ICD-10-CM | POA: Diagnosis not present

## 2016-03-01 DIAGNOSIS — I10 Essential (primary) hypertension: Secondary | ICD-10-CM | POA: Diagnosis not present

## 2016-03-01 DIAGNOSIS — Z9181 History of falling: Secondary | ICD-10-CM | POA: Diagnosis not present

## 2016-03-02 ENCOUNTER — Telehealth: Payer: Self-pay | Admitting: Family Medicine

## 2016-03-02 DIAGNOSIS — M170 Bilateral primary osteoarthritis of knee: Secondary | ICD-10-CM | POA: Diagnosis not present

## 2016-03-02 DIAGNOSIS — Z9181 History of falling: Secondary | ICD-10-CM | POA: Diagnosis not present

## 2016-03-02 DIAGNOSIS — I10 Essential (primary) hypertension: Secondary | ICD-10-CM | POA: Diagnosis not present

## 2016-03-02 DIAGNOSIS — G894 Chronic pain syndrome: Secondary | ICD-10-CM

## 2016-03-02 DIAGNOSIS — Z79891 Long term (current) use of opiate analgesic: Secondary | ICD-10-CM | POA: Diagnosis not present

## 2016-03-02 DIAGNOSIS — M1991 Primary osteoarthritis, unspecified site: Secondary | ICD-10-CM | POA: Diagnosis not present

## 2016-03-02 DIAGNOSIS — R296 Repeated falls: Secondary | ICD-10-CM | POA: Diagnosis not present

## 2016-03-02 DIAGNOSIS — C8408 Mycosis fungoides, lymph nodes of multiple sites: Secondary | ICD-10-CM | POA: Diagnosis not present

## 2016-03-02 MED ORDER — TRAMADOL HCL 50 MG PO TABS: 50.0000 mg | ORAL_TABLET | Freq: Every day | ORAL | Status: DC

## 2016-03-02 NOTE — Telephone Encounter (Signed)
I called [facility] and they never received the Rx that was approved on 01/25/16 that was faxed. Rx phoned in today.    KP

## 2016-03-02 NOTE — Telephone Encounter (Signed)
Pt called in a refill request for her TraMADol she says that her carrier [employer] requested that she provide PCP with numbers for refilling.    Phone: [phone]  Fax: [phone]

## 2016-03-03 DIAGNOSIS — R296 Repeated falls: Secondary | ICD-10-CM | POA: Diagnosis not present

## 2016-03-03 DIAGNOSIS — M170 Bilateral primary osteoarthritis of knee: Secondary | ICD-10-CM | POA: Diagnosis not present

## 2016-03-03 DIAGNOSIS — Z9181 History of falling: Secondary | ICD-10-CM | POA: Diagnosis not present

## 2016-03-03 DIAGNOSIS — Z79891 Long term (current) use of opiate analgesic: Secondary | ICD-10-CM | POA: Diagnosis not present

## 2016-03-03 DIAGNOSIS — C8408 Mycosis fungoides, lymph nodes of multiple sites: Secondary | ICD-10-CM | POA: Diagnosis not present

## 2016-03-03 DIAGNOSIS — M1991 Primary osteoarthritis, unspecified site: Secondary | ICD-10-CM | POA: Diagnosis not present

## 2016-03-03 DIAGNOSIS — I10 Essential (primary) hypertension: Secondary | ICD-10-CM | POA: Diagnosis not present

## 2016-03-08 DIAGNOSIS — R296 Repeated falls: Secondary | ICD-10-CM | POA: Diagnosis not present

## 2016-03-08 DIAGNOSIS — Z9181 History of falling: Secondary | ICD-10-CM | POA: Diagnosis not present

## 2016-03-08 DIAGNOSIS — I10 Essential (primary) hypertension: Secondary | ICD-10-CM | POA: Diagnosis not present

## 2016-03-08 DIAGNOSIS — Z79891 Long term (current) use of opiate analgesic: Secondary | ICD-10-CM | POA: Diagnosis not present

## 2016-03-08 DIAGNOSIS — M170 Bilateral primary osteoarthritis of knee: Secondary | ICD-10-CM | POA: Diagnosis not present

## 2016-03-08 DIAGNOSIS — M1991 Primary osteoarthritis, unspecified site: Secondary | ICD-10-CM | POA: Diagnosis not present

## 2016-03-08 DIAGNOSIS — C8408 Mycosis fungoides, lymph nodes of multiple sites: Secondary | ICD-10-CM | POA: Diagnosis not present

## 2016-03-09 DIAGNOSIS — Z79891 Long term (current) use of opiate analgesic: Secondary | ICD-10-CM | POA: Diagnosis not present

## 2016-03-09 DIAGNOSIS — M170 Bilateral primary osteoarthritis of knee: Secondary | ICD-10-CM | POA: Diagnosis not present

## 2016-03-09 DIAGNOSIS — R296 Repeated falls: Secondary | ICD-10-CM | POA: Diagnosis not present

## 2016-03-09 DIAGNOSIS — Z9181 History of falling: Secondary | ICD-10-CM | POA: Diagnosis not present

## 2016-03-09 DIAGNOSIS — I10 Essential (primary) hypertension: Secondary | ICD-10-CM | POA: Diagnosis not present

## 2016-03-09 DIAGNOSIS — C8408 Mycosis fungoides, lymph nodes of multiple sites: Secondary | ICD-10-CM | POA: Diagnosis not present

## 2016-03-09 DIAGNOSIS — M1991 Primary osteoarthritis, unspecified site: Secondary | ICD-10-CM | POA: Diagnosis not present

## 2016-03-10 DIAGNOSIS — Z9181 History of falling: Secondary | ICD-10-CM | POA: Diagnosis not present

## 2016-03-10 DIAGNOSIS — R296 Repeated falls: Secondary | ICD-10-CM | POA: Diagnosis not present

## 2016-03-10 DIAGNOSIS — C8408 Mycosis fungoides, lymph nodes of multiple sites: Secondary | ICD-10-CM | POA: Diagnosis not present

## 2016-03-10 DIAGNOSIS — M1991 Primary osteoarthritis, unspecified site: Secondary | ICD-10-CM | POA: Diagnosis not present

## 2016-03-10 DIAGNOSIS — M170 Bilateral primary osteoarthritis of knee: Secondary | ICD-10-CM | POA: Diagnosis not present

## 2016-03-10 DIAGNOSIS — Z79891 Long term (current) use of opiate analgesic: Secondary | ICD-10-CM | POA: Diagnosis not present

## 2016-03-10 DIAGNOSIS — I10 Essential (primary) hypertension: Secondary | ICD-10-CM | POA: Diagnosis not present

## 2016-03-15 DIAGNOSIS — R296 Repeated falls: Secondary | ICD-10-CM | POA: Diagnosis not present

## 2016-03-15 DIAGNOSIS — M1991 Primary osteoarthritis, unspecified site: Secondary | ICD-10-CM | POA: Diagnosis not present

## 2016-03-15 DIAGNOSIS — I10 Essential (primary) hypertension: Secondary | ICD-10-CM | POA: Diagnosis not present

## 2016-03-15 DIAGNOSIS — M170 Bilateral primary osteoarthritis of knee: Secondary | ICD-10-CM | POA: Diagnosis not present

## 2016-03-15 DIAGNOSIS — Z79891 Long term (current) use of opiate analgesic: Secondary | ICD-10-CM | POA: Diagnosis not present

## 2016-03-15 DIAGNOSIS — C8408 Mycosis fungoides, lymph nodes of multiple sites: Secondary | ICD-10-CM | POA: Diagnosis not present

## 2016-03-15 DIAGNOSIS — Z9181 History of falling: Secondary | ICD-10-CM | POA: Diagnosis not present

## 2016-03-16 DIAGNOSIS — Z79891 Long term (current) use of opiate analgesic: Secondary | ICD-10-CM | POA: Diagnosis not present

## 2016-03-16 DIAGNOSIS — M1991 Primary osteoarthritis, unspecified site: Secondary | ICD-10-CM | POA: Diagnosis not present

## 2016-03-16 DIAGNOSIS — Z9181 History of falling: Secondary | ICD-10-CM | POA: Diagnosis not present

## 2016-03-16 DIAGNOSIS — R296 Repeated falls: Secondary | ICD-10-CM | POA: Diagnosis not present

## 2016-03-16 DIAGNOSIS — I10 Essential (primary) hypertension: Secondary | ICD-10-CM | POA: Diagnosis not present

## 2016-03-16 DIAGNOSIS — C8408 Mycosis fungoides, lymph nodes of multiple sites: Secondary | ICD-10-CM | POA: Diagnosis not present

## 2016-03-16 DIAGNOSIS — M170 Bilateral primary osteoarthritis of knee: Secondary | ICD-10-CM | POA: Diagnosis not present

## 2016-03-17 DIAGNOSIS — Z9181 History of falling: Secondary | ICD-10-CM | POA: Diagnosis not present

## 2016-03-17 DIAGNOSIS — C8408 Mycosis fungoides, lymph nodes of multiple sites: Secondary | ICD-10-CM | POA: Diagnosis not present

## 2016-03-17 DIAGNOSIS — Z79891 Long term (current) use of opiate analgesic: Secondary | ICD-10-CM | POA: Diagnosis not present

## 2016-03-17 DIAGNOSIS — I10 Essential (primary) hypertension: Secondary | ICD-10-CM | POA: Diagnosis not present

## 2016-03-17 DIAGNOSIS — M170 Bilateral primary osteoarthritis of knee: Secondary | ICD-10-CM | POA: Diagnosis not present

## 2016-03-17 DIAGNOSIS — R296 Repeated falls: Secondary | ICD-10-CM | POA: Diagnosis not present

## 2016-03-17 DIAGNOSIS — M1991 Primary osteoarthritis, unspecified site: Secondary | ICD-10-CM | POA: Diagnosis not present

## 2016-03-21 ENCOUNTER — Telehealth: Payer: Self-pay | Admitting: *Deleted

## 2016-03-21 NOTE — Telephone Encounter (Signed)
Forwarded to Dr. Lowne-Chase. JG//CMA 

## 2016-03-22 DIAGNOSIS — I10 Essential (primary) hypertension: Secondary | ICD-10-CM | POA: Diagnosis not present

## 2016-03-22 DIAGNOSIS — M1991 Primary osteoarthritis, unspecified site: Secondary | ICD-10-CM | POA: Diagnosis not present

## 2016-03-22 DIAGNOSIS — Z9181 History of falling: Secondary | ICD-10-CM | POA: Diagnosis not present

## 2016-03-22 DIAGNOSIS — R296 Repeated falls: Secondary | ICD-10-CM | POA: Diagnosis not present

## 2016-03-22 DIAGNOSIS — M170 Bilateral primary osteoarthritis of knee: Secondary | ICD-10-CM | POA: Diagnosis not present

## 2016-03-22 DIAGNOSIS — Z79891 Long term (current) use of opiate analgesic: Secondary | ICD-10-CM | POA: Diagnosis not present

## 2016-03-22 DIAGNOSIS — C8408 Mycosis fungoides, lymph nodes of multiple sites: Secondary | ICD-10-CM | POA: Diagnosis not present

## 2016-03-23 NOTE — Telephone Encounter (Signed)
Signed forms faxed to [facility] successfully at [phone]. Sent for scanning. JG//CMA

## 2016-03-24 DIAGNOSIS — Z9181 History of falling: Secondary | ICD-10-CM | POA: Diagnosis not present

## 2016-03-24 DIAGNOSIS — C8408 Mycosis fungoides, lymph nodes of multiple sites: Secondary | ICD-10-CM | POA: Diagnosis not present

## 2016-03-24 DIAGNOSIS — M170 Bilateral primary osteoarthritis of knee: Secondary | ICD-10-CM | POA: Diagnosis not present

## 2016-03-24 DIAGNOSIS — M1991 Primary osteoarthritis, unspecified site: Secondary | ICD-10-CM | POA: Diagnosis not present

## 2016-03-24 DIAGNOSIS — R296 Repeated falls: Secondary | ICD-10-CM | POA: Diagnosis not present

## 2016-03-24 DIAGNOSIS — I10 Essential (primary) hypertension: Secondary | ICD-10-CM | POA: Diagnosis not present

## 2016-03-24 DIAGNOSIS — Z79891 Long term (current) use of opiate analgesic: Secondary | ICD-10-CM | POA: Diagnosis not present

## 2016-03-29 DIAGNOSIS — I10 Essential (primary) hypertension: Secondary | ICD-10-CM | POA: Diagnosis not present

## 2016-03-29 DIAGNOSIS — R296 Repeated falls: Secondary | ICD-10-CM | POA: Diagnosis not present

## 2016-03-29 DIAGNOSIS — C8408 Mycosis fungoides, lymph nodes of multiple sites: Secondary | ICD-10-CM | POA: Diagnosis not present

## 2016-03-29 DIAGNOSIS — Z9181 History of falling: Secondary | ICD-10-CM | POA: Diagnosis not present

## 2016-03-29 DIAGNOSIS — Z79891 Long term (current) use of opiate analgesic: Secondary | ICD-10-CM | POA: Diagnosis not present

## 2016-03-29 DIAGNOSIS — M1991 Primary osteoarthritis, unspecified site: Secondary | ICD-10-CM | POA: Diagnosis not present

## 2016-03-29 DIAGNOSIS — M170 Bilateral primary osteoarthritis of knee: Secondary | ICD-10-CM | POA: Diagnosis not present

## 2016-03-29 MED FILL — HYDROCODON-APAP 5-325: 5-325 | 22 days supply | Qty: 90 | Fill #0

## 2016-04-01 DIAGNOSIS — R296 Repeated falls: Secondary | ICD-10-CM | POA: Diagnosis not present

## 2016-04-01 DIAGNOSIS — C8408 Mycosis fungoides, lymph nodes of multiple sites: Secondary | ICD-10-CM | POA: Diagnosis not present

## 2016-04-01 DIAGNOSIS — I10 Essential (primary) hypertension: Secondary | ICD-10-CM | POA: Diagnosis not present

## 2016-04-01 DIAGNOSIS — M1991 Primary osteoarthritis, unspecified site: Secondary | ICD-10-CM | POA: Diagnosis not present

## 2016-04-01 DIAGNOSIS — Z9181 History of falling: Secondary | ICD-10-CM | POA: Diagnosis not present

## 2016-04-01 DIAGNOSIS — M170 Bilateral primary osteoarthritis of knee: Secondary | ICD-10-CM | POA: Diagnosis not present

## 2016-04-01 DIAGNOSIS — Z79891 Long term (current) use of opiate analgesic: Secondary | ICD-10-CM | POA: Diagnosis not present

## 2016-04-22 ENCOUNTER — Other Ambulatory Visit: Payer: Self-pay | Admitting: Family Medicine

## 2016-04-28 ENCOUNTER — Telehealth: Payer: Self-pay | Admitting: Family Medicine

## 2016-04-28 DIAGNOSIS — G894 Chronic pain syndrome: Secondary | ICD-10-CM

## 2016-04-28 MED ORDER — HYDROCODONE-ACETAMINOPHEN 5-325 MG PO TABS: 1.0000 | ORAL_TABLET | Freq: Four times a day (QID) | ORAL | Status: DC | PRN

## 2016-04-28 NOTE — Telephone Encounter (Signed)
You were giving her 3 at a time, is that ok to continue to fill?   KP

## 2016-04-28 NOTE — Telephone Encounter (Signed)
Pt request a refill for her  HYDROcodone Rx.   CB: [phone]

## 2016-04-28 NOTE — Telephone Encounter (Signed)
Refill x1 

## 2016-04-28 NOTE — Telephone Encounter (Signed)
Last seen 02/11/16 and filled on 03/26/16 #90   Please advise     KP

## 2016-04-28 NOTE — Telephone Encounter (Signed)
yes

## 2016-04-28 NOTE — Telephone Encounter (Signed)
Patient aware the med's are ready for pick up.    KP

## 2016-05-02 DIAGNOSIS — M170 Bilateral primary osteoarthritis of knee: Secondary | ICD-10-CM | POA: Diagnosis not present

## 2016-05-04 MED FILL — HYDROCODON-APAP 5-325: 5-325 | 22 days supply | Qty: 90 | Fill #0

## 2016-05-25 ENCOUNTER — Ambulatory Visit: Payer: Medicare Other | Admitting: Podiatry

## 2016-06-01 DIAGNOSIS — M170 Bilateral primary osteoarthritis of knee: Secondary | ICD-10-CM | POA: Diagnosis not present

## 2016-06-02 MED FILL — HYDROCODON-APAP 5-325: 5-325 | 22 days supply | Qty: 90 | Fill #0

## 2016-06-03 ENCOUNTER — Other Ambulatory Visit: Payer: Self-pay

## 2016-06-03 DIAGNOSIS — G894 Chronic pain syndrome: Secondary | ICD-10-CM

## 2016-06-03 MED ORDER — TRAMADOL HCL 50 MG PO TABS: 50.0000 mg | ORAL_TABLET | Freq: Every day | ORAL | 0 refills | Status: DC

## 2016-06-03 NOTE — Telephone Encounter (Signed)
Last seen 4.6.17 and filled 03/02/16 #90   Please advise   KP

## 2016-06-13 ENCOUNTER — Other Ambulatory Visit: Payer: Self-pay | Admitting: Family Medicine

## 2016-06-13 ENCOUNTER — Telehealth: Payer: Self-pay | Admitting: Family Medicine

## 2016-06-13 DIAGNOSIS — I10 Essential (primary) hypertension: Secondary | ICD-10-CM

## 2016-06-13 NOTE — Telephone Encounter (Signed)
Pt called in to update. She says that she called Pharmacy and they have her taken care of.

## 2016-06-13 NOTE — Telephone Encounter (Signed)
°  Relation to pt: self  Call back number:[phone] Pharmacy: [place], [place] [phone] (Phone) [phone] (Fax)     Reason for call:  Patient inquiring about losartan (COZAAR) 50 MG tablet and states when medication was received thru the mail order it was blue rather then white. Please advise

## 2016-06-13 NOTE — Telephone Encounter (Signed)
Noted. Thanks.

## 2016-07-01 ENCOUNTER — Encounter: Payer: Self-pay | Admitting: Family Medicine

## 2016-07-01 ENCOUNTER — Encounter (HOSPITAL_COMMUNITY): Payer: Self-pay | Admitting: Vascular Surgery

## 2016-07-01 ENCOUNTER — Ambulatory Visit (INDEPENDENT_AMBULATORY_CARE_PROVIDER_SITE_OTHER): Payer: Medicare Other | Admitting: Family Medicine

## 2016-07-01 ENCOUNTER — Ambulatory Visit (HOSPITAL_BASED_OUTPATIENT_CLINIC_OR_DEPARTMENT_OTHER)
Admission: RE | Admit: 2016-07-01 | Discharge: 2016-07-01 | Disposition: A | Discharge status: Home / Self Care | Payer: Medicare Other | Source: Ambulatory Visit | Attending: Family Medicine | Admitting: Family Medicine

## 2016-07-01 ENCOUNTER — Emergency Department (HOSPITAL_COMMUNITY)
Admission: EM | Admit: 2016-07-01 | Discharge: 2016-07-02 | Disposition: A | Discharge status: Home / Self Care | Payer: Medicare Other | Attending: Emergency Medicine | Admitting: Emergency Medicine

## 2016-07-01 VITALS — BP 132/72 | HR 73 | Temp 98.1°F

## 2016-07-01 DIAGNOSIS — I10 Essential (primary) hypertension: Secondary | ICD-10-CM | POA: Insufficient documentation

## 2016-07-01 DIAGNOSIS — M79604 Pain in right leg: Secondary | ICD-10-CM | POA: Diagnosis not present

## 2016-07-01 DIAGNOSIS — M7989 Other specified soft tissue disorders: Secondary | ICD-10-CM

## 2016-07-01 DIAGNOSIS — M1611 Unilateral primary osteoarthritis, right hip: Secondary | ICD-10-CM | POA: Diagnosis not present

## 2016-07-01 DIAGNOSIS — G894 Chronic pain syndrome: Secondary | ICD-10-CM | POA: Diagnosis not present

## 2016-07-01 DIAGNOSIS — Z79899 Other long term (current) drug therapy: Secondary | ICD-10-CM | POA: Diagnosis not present

## 2016-07-01 DIAGNOSIS — Z96653 Presence of artificial knee joint, bilateral: Secondary | ICD-10-CM | POA: Insufficient documentation

## 2016-07-01 MED FILL — HYDROCODON-APAP 5-325: 5-325 | 22 days supply | Qty: 90 | Fill #0

## 2016-07-01 NOTE — Progress Notes (Signed)
Pre visit review using our clinic review tool, if applicable. No additional management support is needed unless otherwise documented below in the visit note. 

## 2016-07-01 NOTE — ED Triage Notes (Addendum)
Pt reports to the ED for eval of left leg pain that radiates down her left leg. Pt had an U/S done and she was told she did not have a blood clot so she was referred here to see Dr. Sharol Given. Pt has been having increased pain over the past week. She has increased her pain medication from 1 time a day to 2 times a day but she still only has minimal relief. [place] in November but denies any recent injury to that leg. She has had X-rays done and they were all unremarkable.

## 2016-07-01 NOTE — Progress Notes (Signed)
Patient ID: Norma Ford, female    DOB: [birth year]  Age: 82 y.o. MRN: [id number]    Subjective:  Subjective  HPI Norma Ford presents for R leg and groin pain.  She states this has been going on for 6 months but recently worsened.  The leg is swollen as well.  No chest pain or worsening sob  Review of Systems  Constitutional: Negative for appetite change, diaphoresis, fatigue and unexpected weight change.  Eyes: Negative for pain, redness and visual disturbance.  Respiratory: Negative for cough, chest tightness, shortness of breath and wheezing.   Cardiovascular: Positive for leg swelling. Negative for chest pain and palpitations.  Endocrine: Negative for cold intolerance, heat intolerance, polydipsia, polyphagia and polyuria.  Genitourinary: Negative for difficulty urinating, dysuria and frequency.  Musculoskeletal: Positive for joint swelling.       R groin and leg pain  Neurological: Negative for dizziness, light-headedness, numbness and headaches.    History Past Medical History:  Diagnosis Date  . Adrenal tumor 2002   right, followed by Surgery  . Barrett's esophagus 1999  . Cataracts, bilateral   . DECUBITUS ULCER, BUTTOCK 12/21/2009  . History of abnormal mammogram    2001-h/o abnormal mammo-told just calcium deposit-repeat  . HTN (hypertension)   . Hyperlipidemia   . Kidney stone   . MYCOSIS FUNGOIDES LYMPH NODES MULTIPLE SITES 09/21/2009  . THROMBOCYTOPENIA 12/21/2009    She has a past surgical history that includes Total knee arthroplasty; Esophagogastroduodenoscopy (03/2002); tubal ligation; Abdominal hysterectomy; Tonsillectomy; and appendectomy.   Her family history includes Heart disease in her father and mother; Kidney disease in her father.She reports that she has never smoked. She has never used smokeless tobacco. Her alcohol and drug histories are not on file.  Current Outpatient Prescriptions on File Prior to Visit  Medication Sig Dispense Refill  .  clotrimazole-betamethasone (LOTRISONE) cream APPLY ONE APPLICATION TOPICALLY TWO TIMES DAILY 45 g 0  . furosemide (LASIX) 40 MG tablet Take 1 tablet by mouth two  times daily 180 tablet 3  . HYDROcodone-acetaminophen (NORCO/VICODIN) 5-325 MG tablet Take 1 tablet by mouth every 6 (six) hours as needed for moderate pain. 90 tablet 0  . losartan (COZAAR) 50 MG tablet Take 1 tablet by mouth  daily 90 tablet 1  . metoprolol succinate (TOPROL-XL) 25 MG 24 hr tablet Take 1 tablet (25 mg total) by mouth daily. 90 tablet 3  . Misc. Devices (BARIATRIC ROLLATOR) MISC Four Wheel [employer] officer, museum with Seat. Dx: 1 each 0  . Misc. Devices (COMMODE BEDSIDE) MISC Bariatric Bedside commode 1 each 0  . mometasone (NASONEX) 50 MCG/ACT nasal spray Place 2 sprays into the nose daily.      . Multiple Vitamin (MULTIVITAMIN) tablet Take 1 tablet by mouth daily.      . NONFORMULARY OR COMPOUNDED ITEM Wheelchair, manual #1  As directed---dx osteoarthritis, frequent falls 1 each o  . NONFORMULARY OR COMPOUNDED ITEM Lift Chair to use at home as needed 1 each 0  . polyethylene glycol powder (GLYCOLAX/MIRALAX) powder Take 17 g by mouth 2 (two) times daily as needed. 3350 g 3  . potassium chloride SA (K-DUR,KLOR-CON) 20 MEQ tablet Take 1 tablet by mouth two  times daily 180 tablet 1  . sertraline (ZOLOFT) 50 MG tablet Take 1 tablet by mouth  daily 90 tablet 3  . simvastatin (ZOCOR) 80 MG tablet Take 1 tablet by mouth at  bedtime 90 tablet 1  . traMADol (ULTRAM) 50 MG tablet Take 1 tablet (  50 mg total) by mouth daily. 90 tablet 0   No current facility-administered medications on file prior to visit.      Objective:  Objective  Physical Exam  Constitutional: She is oriented to person, place, and time. She appears well-developed and well-nourished.  HENT:  Head: Normocephalic and atraumatic.  Eyes: Conjunctivae and EOM are normal.  Neck: Normal range of motion. Neck supple. No JVD present. Carotid bruit is not present. No  thyromegaly present.  Cardiovascular: Normal rate and regular rhythm.   Pulmonary/Chest: Effort normal and breath sounds normal. No respiratory distress. She has no wheezes. She has no rales. She exhibits no tenderness.  Musculoskeletal: She exhibits edema and tenderness.  r leg -- + pain with palp R thigh and swelling                + pain in calf  + swelling  Neurological: She is alert and oriented to person, place, and time.  Psychiatric: She has a normal mood and affect.  Nursing note and vitals reviewed.  BP 132/72 (BP Location: Right Arm, Patient Position: Sitting) Comment (BP Location): Right wrist  Pulse 73   Temp 98.1 F (36.7 C)   SpO2 93%  Wt Readings from Last 3 Encounters:  04/17/15 287 lb 3.2 oz (130.3 kg)  11/10/14 296 lb 6.4 oz (134.4 kg)  02/24/14 277 lb (125.6 kg)     Lab Results  Component Value Date   WBC 5.0 02/11/2016   HGB 12.8 02/11/2016   HCT 38.0 02/11/2016   PLT 165.0 02/11/2016   GLUCOSE 83 02/11/2016   CHOL 183 10/22/2015   TRIG 146.0 10/22/2015   HDL 42.20 10/22/2015   LDLCALC 111 (H) 10/22/2015   ALT 9 02/11/2016   AST 13 02/11/2016   NA 141 02/11/2016   K 4.0 02/11/2016   CL 104 02/11/2016   CREATININE 0.74 02/11/2016   BUN 17 02/11/2016   CO2 33 (H) 02/11/2016   TSH 2.06 02/11/2016   MICROALBUR 0.7 11/10/2014    Dg Hips Bilat With Pelvis 2v  Result Date: 02/11/2016 CLINICAL DATA:  [place] 1 week ago.  Bilateral hip pain. EXAM: DG HIP (WITH OR WITHOUT PELVIS) 2V BILAT COMPARISON:  None. FINDINGS: No evidence of hip, pelvic or sacral fracture on these views. IMPRESSION: Negative. Electronically Signed   By: Nelson Chimes M.D.   On: 02/11/2016 16:33     Assessment & Plan:  Plan  I am having Ms. Unterreiner maintain her multivitamin, mometasone, clotrimazole-betamethasone, Commode Bedside, metoprolol succinate, polyethylene glycol powder, simvastatin, NONFORMULARY OR COMPOUNDED ITEM, Bariatric Rollator, NONFORMULARY OR COMPOUNDED ITEM, sertraline,  furosemide, HYDROcodone-acetaminophen, traMADol, losartan, and potassium chloride SA.  No orders of the defined types were placed in this encounter.   Problem List Items Addressed This Visit    None    Visit Diagnoses    Leg swelling    -  Primary   Relevant Orders   US Venous Img Lower Unilateral Right (Completed)   Chronic pain syndrome       Relevant Orders   Ambulatory referral to Orthopedic Surgery    [place] leg-- Neg DVT Refer to ortho---  Family returned to office -- they do not want to wait for referral-- they want to take her to the ER  Pt has pain meds at home--- they are not helping her pain  Follow-up: No Follow-up on file.  Ann Held, DO

## 2016-07-01 NOTE — Assessment & Plan Note (Signed)
Pain and swelling x > 6 months-- recently became more painful [place] leg -- r/o dvt con't pain meds

## 2016-07-01 NOTE — Patient Instructions (Signed)
We will get an [place] today If pain worsens over the weekend -- go to Er

## 2016-07-02 ENCOUNTER — Emergency Department (HOSPITAL_COMMUNITY): Payer: Medicare Other

## 2016-07-02 DIAGNOSIS — M1611 Unilateral primary osteoarthritis, right hip: Secondary | ICD-10-CM | POA: Diagnosis not present

## 2016-07-02 MED ORDER — OXYCODONE HCL 5 MG PO TABS
2.5000 mg | ORAL_TABLET | Freq: Once | ORAL | Status: AC
  Administered 2016-07-02: 2.5 mg via ORAL
  Filled 2016-07-02: qty 1

## 2016-07-02 MED ORDER — NAPROXEN 250 MG PO TABS
500.0000 mg | ORAL_TABLET | Freq: Once | ORAL | Status: AC
  Administered 2016-07-02: 500 mg via ORAL
  Filled 2016-07-02: qty 2

## 2016-07-02 MED ORDER — ACETAMINOPHEN 500 MG PO TABS
1000.0000 mg | ORAL_TABLET | Freq: Once | ORAL | Status: AC
  Administered 2016-07-02: 1000 mg via ORAL
  Filled 2016-07-02: qty 2

## 2016-07-02 NOTE — ED Notes (Signed)
MD at bedside. 

## 2016-07-02 NOTE — ED Provider Notes (Signed)
[facility] DEPT Provider Note   CSN: [id number] Arrival date & time: 07/01/16  1739  By signing my name below, I, Irene Pap, attest that this documentation has been prepared under the direction and in the presence of Deno Etienne, DO. Electronically Signed: Irene Pap, ED Scribe. 07/02/16. 12:48 AM.  History   Chief Complaint Chief Complaint  Patient presents with  . Leg Pain   The history is provided by the patient. No language interpreter was used.  Leg Pain   This is a new problem. The current episode started 12 to 24 hours ago. The problem occurs constantly. The problem has been gradually worsening. The pain is present in the right upper leg and right lower leg. The quality of the pain is described as aching. The pain is moderate. Pertinent negatives include no numbness. Treatments tried: Tramadol. The treatment provided no relief.  HPI Comments: Norma Ford is a 82 y.o. female with a hx of HTN and osteoarthritis who presents to the Emergency Department complaining of right groin pain that radiates down the leg onset one week ago. Pt was seen by her PCP with US performed for blood clot rule out. Pt was then referred to see Dr. Sharol Given outpatient. Family preferred to be seen in the ED. Pt reports worsening pain with palpation of the area and walking. She states that her right leg is significantly more swollen than the left leg. She denies recent injuries or falls. She denies numbness or weakness.   Past Medical History:  Diagnosis Date  . Adrenal tumor 2002   right, followed by Surgery  . Barrett's esophagus 1999  . Cataracts, bilateral   . DECUBITUS ULCER, BUTTOCK 12/21/2009  . History of abnormal mammogram    2001-h/o abnormal mammo-told just calcium deposit-repeat  . HTN (hypertension)   . Hyperlipidemia   . Kidney stone   . MYCOSIS FUNGOIDES LYMPH NODES MULTIPLE SITES 09/21/2009  . THROMBOCYTOPENIA 12/21/2009    Patient Active Problem List   Diagnosis Date Noted    . Right leg pain 07/01/2016  . Frequent falls 02/14/2016  . History of fall 11/14/2015  . Osteoarthritis of both knees 05/15/2015  . Severe obesity (BMI >= 40) ([place]) 02/24/2014  . Cellulitis and abscess of lower leg 05/21/2012  . Calf pain 05/21/2012  . Edema 05/21/2012  . Chronic bronchitis 11/03/2011  . Hypoxia 10/20/2011  . THROMBOCYTOPENIA 12/21/2009  . DECUBITUS ULCER, BUTTOCK 12/21/2009  . MYCOSIS FUNGOIDES LYMPH NODES MULTIPLE SITES 09/21/2009  . DEGENERATIVE JOINT DISEASE, KNEE 09/21/2009  . SHOULDER PAIN, RIGHT 09/21/2009  . FEMALE STRESS INCONTINENCE 01/16/2009  . HYPERLIPIDEMIA 02/12/2008  . Essential hypertension 02/12/2008    Past Surgical History:  Procedure Laterality Date  . ABDOMINAL HYSTERECTOMY    . appendectomy    . ESOPHAGOGASTRODUODENOSCOPY  03/2002  . TONSILLECTOMY    . TOTAL KNEE ARTHROPLASTY     left 2009, right 2003  . tubal ligation      OB History    No data available       Home Medications    Prior to Admission medications   Medication Sig Start Date End Date Taking? Authorizing Provider  furosemide (LASIX) 40 MG tablet Take 1 tablet by mouth two  times daily 04/22/16  Yes Yvonne R Lowne Chase, DO  HYDROcodone-acetaminophen (NORCO/VICODIN) 5-325 MG tablet Take 1 tablet by mouth every 6 (six) hours as needed for moderate pain. 04/28/16  Yes Yvonne R Lowne Chase, DO  losartan (COZAAR) 50 MG tablet Take 1 tablet by  mouth  daily 06/13/16  Yes Yvonne R Lowne Chase, DO  mometasone (NASONEX) 50 MCG/ACT nasal spray Place 2 sprays into the nose daily.     Yes Historical Provider, MD  Multiple Vitamin (MULTIVITAMIN) tablet Take 1 tablet by mouth daily.     Yes Historical Provider, MD  Omega-3 Fatty Acids (FISH OIL PO) Take 1 tablet by mouth 2 (two) times daily.   Yes Historical Provider, MD  polyethylene glycol powder (GLYCOLAX/MIRALAX) powder Take 17 g by mouth 2 (two) times daily as needed. 07/24/15  Yes Yvonne R Lowne Chase, DO  potassium chloride SA  (K-DUR,KLOR-CON) 20 MEQ tablet Take 1 tablet by mouth two  times daily 06/13/16  Yes Alferd Apa Lowne Chase, DO  sertraline (ZOLOFT) 50 MG tablet Take 1 tablet by mouth  daily 04/22/16  Yes Yvonne R Lowne Chase, DO  traMADol (ULTRAM) 50 MG tablet Take 1 tablet (50 mg total) by mouth daily. 06/03/16  Yes Yvonne R Lowne Chase, DO  clotrimazole-betamethasone (LOTRISONE) cream APPLY ONE APPLICATION TOPICALLY TWO TIMES DAILY Patient not taking: Reported on 07/02/2016 04/14/15   Wallene Huh, DPM  metoprolol succinate (TOPROL-XL) 25 MG 24 hr tablet Take 1 tablet (25 mg total) by mouth daily. Patient not taking: Reported on 07/02/2016 06/16/15   Ann Held, DO  Misc. Devices (BARIATRIC ROLLATOR) MISC Four Wheel Education officer, museum with Seat. Dx: 02/26/16   Ann Held, DO  Misc. Devices (COMMODE BEDSIDE) MISC Bariatric Bedside commode 05/26/15   Ann Held, DO  NONFORMULARY OR COMPOUNDED ITEM Wheelchair, manual #1  As directed---dx osteoarthritis, frequent falls 02/11/16   Ann Held, DO  NONFORMULARY OR COMPOUNDED ITEM Lift Chair to use at home as needed 02/26/16   Ann Held, DO  simvastatin (ZOCOR) 80 MG tablet Take 1 tablet by mouth at  bedtime Patient not taking: Reported on 07/02/2016 08/03/15   Ann Held, DO    Family History Family History  Problem Relation Age of Onset  . Heart disease Mother   . Heart disease Father     pacemaker  . Kidney disease Father     Social History Social History  Substance Use Topics  . Smoking status: Never Smoker  . Smokeless tobacco: Never Used  . Alcohol use Not on file     Allergies   Codeine   Review of Systems Review of Systems  Constitutional: Negative for chills and fever.  HENT: Negative for congestion and rhinorrhea.   Eyes: Negative for redness and visual disturbance.  Respiratory: Negative for shortness of breath and wheezing.   Cardiovascular: Positive for leg swelling. Negative for chest  pain and palpitations.  Gastrointestinal: Negative for nausea and vomiting.  Genitourinary: Negative for dysuria and urgency.  Musculoskeletal: Positive for arthralgias. Negative for myalgias.  Skin: Negative for pallor and wound.  Neurological: Negative for dizziness, weakness, numbness and headaches.     Physical Exam Updated Vital Signs BP 171/68   Pulse 60   Temp 98.7 F (37.1 C) (Oral)   Resp 22   SpO2 99%   Physical Exam  Constitutional: She is oriented to person, place, and time. She appears well-developed and well-nourished. No distress.  HENT:  Head: Normocephalic and atraumatic.  Eyes: EOM are normal. Pupils are equal, round, and reactive to light.  Neck: Normal range of motion. Neck supple.  Cardiovascular: Normal rate and regular rhythm.  Exam reveals no gallop and no friction rub.   No murmur heard.  Pulmonary/Chest: Effort normal. She has no wheezes. She has no rales.  Abdominal: Soft. She exhibits no distension. There is no tenderness.  Musculoskeletal:  chronic skin changes to bilateral feet; nonspecific erythema; neither leg is significantly warmer than the other; pain worse with palpation about the groin at the attachment of the adductor muscle of the right leg; no significant tenderness with external and internal rotation of the hip; no midline spine tenderness  Neurological: She is alert and oriented to person, place, and time.  Skin: Skin is warm and dry. She is not diaphoretic.  Psychiatric: She has a normal mood and affect. Her behavior is normal.  Nursing note and vitals reviewed.    ED Treatments / Results  DIAGNOSTIC STUDIES: Oxygen Saturation is 97% on RA, normal by my interpretation.    COORDINATION OF CARE: 12:46 AM-Discussed treatment plan which includes x-ray with pt at bedside and pt agreed to plan.    Labs (all labs ordered are listed, but only abnormal results are displayed) Labs Reviewed - No data to display  EKG  EKG  Interpretation None       Radiology US Venous Img Lower Unilateral Right  Result Date: 07/01/2016 CLINICAL DATA:  82 year old female with a history of leg pain EXAM: RIGHT LOWER EXTREMITY VENOUS DOPPLER ULTRASOUND TECHNIQUE: Gray-scale sonography with graded compression, as well as color Doppler and duplex ultrasound were performed to evaluate the lower extremity deep venous systems from the level of the common femoral vein and including the common femoral, femoral, profunda femoral, popliteal and calf veins including the posterior tibial, peroneal and gastrocnemius veins when visible. The superficial great saphenous vein was also interrogated. Spectral Doppler was utilized to evaluate flow at rest and with distal augmentation maneuvers in the common femoral, femoral and popliteal veins. COMPARISON:  None. FINDINGS: Contralateral Common Femoral Vein: Respiratory phasicity is normal and symmetric with the symptomatic side. No evidence of thrombus. Normal compressibility. Common Femoral Vein: No evidence of thrombus. Normal compressibility, respiratory phasicity and response to augmentation. Saphenofemoral Junction: No evidence of thrombus. Normal compressibility and flow on color Doppler imaging. Profunda Femoral Vein: No evidence of thrombus. Normal compressibility and flow on color Doppler imaging. Femoral Vein: No evidence of thrombus. Normal compressibility, respiratory phasicity and response to augmentation. Popliteal Vein: No evidence of thrombus. Normal compressibility, respiratory phasicity and response to augmentation. Calf Veins: Limited visualization of the calf veins. Superficial Great Saphenous Vein: No evidence of thrombus. Normal compressibility and flow on color Doppler imaging. Other Findings:  Edema of the right lower extremity. IMPRESSION: Sonographic survey of the right lower extremity negative for DVT. Edema of the right lower extremity. Signed, Dulcy Fanny. Earleen Newport, DO [facility] Electronically Signed   By: Corrie Mckusick D.O.   On: 07/01/2016 15:56   Dg Hip Unilat W Or Wo Pelvis 2-3 Views Right  Result Date: 07/02/2016 CLINICAL DATA:  Right hip and leg pain today.  No known injury. EXAM: DG HIP (WITH OR WITHOUT PELVIS) 2-3V RIGHT COMPARISON:  None. FINDINGS: Degenerative changes in the right hip. No evidence of acute fracture or dislocation in either the pelvis or right hip. Degenerative changes also demonstrated in the lower lumbar spine and left hip. SI joints and symphysis pubis are not displaced. IMPRESSION: Degenerative changes in the right hip.  No acute bony abnormalities. Electronically Signed   By: Lucienne Capers M.D.   On: 07/02/2016 02:00    Procedures Procedures (including critical care time)  Medications Ordered in  ED Medications  acetaminophen (TYLENOL) tablet 1,000 mg (1,000 mg Oral Given 07/02/16 0112)  naproxen (NAPROSYN) tablet 500 mg (500 mg Oral Given 07/02/16 0111)  oxyCODONE (Oxy IR/ROXICODONE) immediate release tablet 2.5 mg (2.5 mg Oral Given 07/02/16 0115)     Initial Impression / Assessment and Plan / ED Course  I have reviewed the triage vital signs and the nursing notes.  Pertinent labs & imaging results that were available during my care of the patient were reviewed by me and considered in my medical decision making (see chart for details).  Clinical Course    82 yo F with R leg pain.  Seen in the clinic just prior to this visit.  Pain for 9 months.  [place] negative for DVT.  Patient concerned about the pain came here for urgent ortho evaluation.    Patient symptoms most likely with nerve compression, no cauda equina s/sx.  Dg right hip negative, suggested PCP and ortho outpatient follow up.   5:06 AM:  I have discussed the diagnosis/risks/treatment options with the patient and believe the pt to be eligible for discharge home to follow-up with PCP. We also discussed returning to  the ED immediately if new or worsening sx occur. We discussed the sx which are most concerning (e.g., sudden worsening pain, fever, inability to tolerate by mouth ) that necessitate immediate return. Medications administered to the patient during their visit and any new prescriptions provided to the patient are listed below.  Medications given during this visit Medications  acetaminophen (TYLENOL) tablet 1,000 mg (1,000 mg Oral Given 07/02/16 0112)  naproxen (NAPROSYN) tablet 500 mg (500 mg Oral Given 07/02/16 0111)  oxyCODONE (Oxy IR/ROXICODONE) immediate release tablet 2.5 mg (2.5 mg Oral Given 07/02/16 0115)     The patient appears reasonably screen and/or stabilized for discharge and I doubt any other medical condition or other [facility] requiring further screening, evaluation, or treatment in the ED at this time prior to discharge.    Final Clinical Impressions(s) / ED Diagnoses   Final diagnoses:  Right leg pain   I personally performed the services described in this documentation, which was scribed in my presence. The recorded information has been reviewed and is accurate.   New Prescriptions Discharge Medication List as of 07/02/2016  2:04 AM       Deno Etienne, DO 07/02/16 [id number]

## 2016-07-02 NOTE — ED Notes (Signed)
Pt requesting to speak with MD prior to D/C. MD made aware.

## 2016-07-02 NOTE — Discharge Instructions (Signed)
Take 2 over-the-counter naproxen tablets twice a day for pain. Also take tylenol 1000mg(2 extra strength) four times a day.   Then take the pain medicine if you feel like you need it. Narcotics do not help with the pain, they only make you care about it less.  You can become addicted to this, people may break into your house to steal it.  It will constipate you.  If you drive under the influence of this medicine you can get a DUI.    

## 2016-07-04 ENCOUNTER — Ambulatory Visit (INDEPENDENT_AMBULATORY_CARE_PROVIDER_SITE_OTHER): Payer: Medicare Other | Admitting: Family Medicine

## 2016-07-04 ENCOUNTER — Encounter: Payer: Self-pay | Admitting: Family Medicine

## 2016-07-04 VITALS — BP 117/62 | HR 63 | Temp 98.1°F

## 2016-07-04 DIAGNOSIS — L03115 Cellulitis of right lower limb: Secondary | ICD-10-CM | POA: Diagnosis not present

## 2016-07-04 DIAGNOSIS — M79604 Pain in right leg: Secondary | ICD-10-CM | POA: Diagnosis not present

## 2016-07-04 NOTE — Progress Notes (Signed)
Pre visit review using our clinic review tool, if applicable. No additional management support is needed unless otherwise documented below in the visit note. 

## 2016-07-04 NOTE — Progress Notes (Signed)
Patient ID: Norma Ford, female    DOB: July 10, 1930  Age: 82 y.o. MRN: [id number]    Subjective:  Subjective  HPI Norma Ford presents for f/u from ER -- she has an appointment with ortho later this week.  Pain is still severe but she is taking pain med bid regularly which helps.    Norma Ford -- neg for dvt Er did not do labs --- sent her back to pcp for labs.   No new complaints.     Review of Systems  Constitutional: Negative for appetite change, diaphoresis, fatigue and unexpected weight change.  Eyes: Negative for pain, redness and visual disturbance.  Respiratory: Negative for cough, chest tightness, shortness of breath and wheezing.   Cardiovascular: Positive for leg swelling. Negative for chest pain and palpitations.  Endocrine: Negative for cold intolerance, heat intolerance, polydipsia, polyphagia and polyuria.  Genitourinary: Negative for difficulty urinating, dysuria and frequency.  Musculoskeletal: Positive for back pain and gait problem.  Neurological: Negative for dizziness, light-headedness, numbness and headaches.    History Past Medical History:  Diagnosis Date  . Adrenal tumor 2002   right, followed by Surgery  . Barrett's esophagus 1999  . Cataracts, bilateral   . DECUBITUS ULCER, BUTTOCK 12/21/2009  . History of abnormal mammogram    2001-h/o abnormal mammo-told just calcium deposit-repeat  . HTN (hypertension)   . Hyperlipidemia   . Kidney stone   . MYCOSIS FUNGOIDES LYMPH NODES MULTIPLE SITES 09/21/2009  . THROMBOCYTOPENIA 12/21/2009    She has a past surgical history that includes Total knee arthroplasty; Esophagogastroduodenoscopy (03/2002); tubal ligation; Abdominal hysterectomy; Tonsillectomy; and appendectomy.   Her family history includes Heart disease in her father and mother; Kidney disease in her father.She reports that she has never smoked. She has never used smokeless tobacco. Her alcohol and drug histories are not on file.  Current  Outpatient Prescriptions on File Prior to Visit  Medication Sig Dispense Refill  . clotrimazole-betamethasone (LOTRISONE) cream APPLY ONE APPLICATION TOPICALLY TWO TIMES DAILY 45 g 0  . furosemide (LASIX) 40 MG tablet Take 1 tablet by mouth two  times daily 180 tablet 3  . HYDROcodone-acetaminophen (NORCO/VICODIN) 5-325 MG tablet Take 1 tablet by mouth every 6 (six) hours as needed for moderate pain. 90 tablet 0  . losartan (COZAAR) 50 MG tablet Take 1 tablet by mouth  daily 90 tablet 1  . metoprolol succinate (TOPROL-XL) 25 MG 24 hr tablet Take 1 tablet (25 mg total) by mouth daily. 90 tablet 3  . Misc. Devices (BARIATRIC ROLLATOR) MISC [employer], museum with Seat. Dx: 1 each 0  . Misc. Devices (COMMODE BEDSIDE) MISC Bariatric Bedside commode 1 each 0  . mometasone (NASONEX) 50 MCG/ACT nasal spray Place 2 sprays into the nose daily.      . Multiple Vitamin (MULTIVITAMIN) tablet Take 1 tablet by mouth daily.      . NONFORMULARY OR COMPOUNDED ITEM Wheelchair, manual #1  As directed---dx osteoarthritis, frequent falls 1 each o  . NONFORMULARY OR COMPOUNDED ITEM Lift Chair to use at home as needed 1 each 0  . Omega-3 Fatty Acids (FISH OIL PO) Take 1 tablet by mouth 2 (two) times daily.    . polyethylene glycol powder (GLYCOLAX/MIRALAX) powder Take 17 g by mouth 2 (two) times daily as needed. 3350 g 3  . potassium chloride SA (K-DUR,KLOR-CON) 20 MEQ tablet Take 1 tablet by mouth two  times daily 180 tablet 1  . sertraline (ZOLOFT) 50 MG tablet Take  1 tablet by mouth  daily 90 tablet 3  . simvastatin (ZOCOR) 80 MG tablet Take 1 tablet by mouth at  bedtime 90 tablet 1  . traMADol (ULTRAM) 50 MG tablet Take 1 tablet (50 mg total) by mouth daily. 90 tablet 0   No current facility-administered medications on file prior to visit.      Objective:  Objective  Physical Exam  Constitutional: She is oriented to person, place, and time. She appears well-developed and well-nourished.  HENT:    Head: Normocephalic and atraumatic.  Eyes: Conjunctivae and EOM are normal.  Neck: Normal range of motion. Neck supple. No JVD present. Carotid bruit is not present. No thyromegaly present.  Cardiovascular: Normal rate, regular rhythm and normal heart sounds.   No murmur heard. Pulmonary/Chest: Effort normal and breath sounds normal. No respiratory distress. She has no wheezes. She has no rales. She exhibits no tenderness.  Musculoskeletal: She exhibits edema and tenderness.       Right ankle: She exhibits swelling.       Right lower leg: She exhibits swelling and edema.  Neurological: She is alert and oriented to person, place, and time.  Psychiatric: She has a normal mood and affect.  Nursing note and vitals reviewed.  BP 117/62 (BP Location: Left Arm, Patient Position: Sitting, Cuff Size: Large)   Pulse 63   Temp 98.1 F (36.7 C) (Oral)   SpO2 95%  Wt Readings from Last 3 Encounters:  04/17/15 287 lb 3.2 oz (130.3 kg)  11/10/14 296 lb 6.4 oz (134.4 kg)  02/24/14 277 lb (125.6 kg)     Lab Results  Component Value Date   WBC 5.0 02/11/2016   HGB 12.8 02/11/2016   HCT 38.0 02/11/2016   PLT 165.0 02/11/2016   GLUCOSE 83 02/11/2016   CHOL 183 10/22/2015   TRIG 146.0 10/22/2015   HDL 42.20 10/22/2015   LDLCALC 111 (H) 10/22/2015   ALT 9 02/11/2016   AST 13 02/11/2016   NA 141 02/11/2016   K 4.0 02/11/2016   CL 104 02/11/2016   CREATININE 0.74 02/11/2016   BUN 17 02/11/2016   CO2 33 (H) 02/11/2016   TSH 2.06 02/11/2016   MICROALBUR 0.7 11/10/2014    Norma Venous Img Lower Unilateral Right  Result Date: 07/01/2016 CLINICAL DATA:  82 year old female with a history of leg pain EXAM: RIGHT LOWER EXTREMITY VENOUS DOPPLER ULTRASOUND TECHNIQUE: Gray-scale sonography with graded compression, as well as color Doppler and duplex ultrasound were performed to evaluate the lower extremity deep venous systems from the level of the common femoral vein and including the common femoral,  femoral, profunda femoral, popliteal and calf veins including the posterior tibial, peroneal and gastrocnemius veins when visible. The superficial great saphenous vein was also interrogated. Spectral Doppler was utilized to evaluate flow at rest and with distal augmentation maneuvers in the common femoral, femoral and popliteal veins. COMPARISON:  None. FINDINGS: Contralateral Common Femoral Vein: Respiratory phasicity is normal and symmetric with the symptomatic side. No evidence of thrombus. Normal compressibility. Common Femoral Vein: No evidence of thrombus. Normal compressibility, respiratory phasicity and response to augmentation. Saphenofemoral Junction: No evidence of thrombus. Normal compressibility and flow on color Doppler imaging. Profunda Femoral Vein: No evidence of thrombus. Normal compressibility and flow on color Doppler imaging. Femoral Vein: No evidence of thrombus. Normal compressibility, respiratory phasicity and response to augmentation. Popliteal Vein: No evidence of thrombus. Normal compressibility, respiratory phasicity and response to augmentation. Calf Veins: Limited visualization of the calf veins. Superficial  Great Saphenous Vein: No evidence of thrombus. Normal compressibility and flow on color Doppler imaging. Other Findings:  Edema of the right lower extremity. IMPRESSION: Sonographic survey of the right lower extremity negative for DVT. Edema of the right lower extremity. Signed, Dulcy Fanny. Earleen Newport, DO [facility] Electronically Signed   By: Corrie Mckusick D.O.   On: 07/01/2016 15:56   Dg Hip Unilat W Or Wo Pelvis 2-3 Views Right  Result Date: 07/02/2016 CLINICAL DATA:  Right hip and leg pain today.  No known injury. EXAM: DG HIP (WITH OR WITHOUT PELVIS) 2-3V RIGHT COMPARISON:  None. FINDINGS: Degenerative changes in the right hip. No evidence of acute fracture or dislocation in either the pelvis or right hip. Degenerative  changes also demonstrated in the lower lumbar spine and left hip. SI joints and symphysis pubis are not displaced. IMPRESSION: Degenerative changes in the right hip.  No acute bony abnormalities. Electronically Signed   By: Lucienne Capers M.D.   On: 07/02/2016 02:00     Assessment & Plan:  Plan  I am having Ms. Nudo maintain her multivitamin, mometasone, clotrimazole-betamethasone, Commode Bedside, metoprolol succinate, polyethylene glycol powder, simvastatin, NONFORMULARY OR COMPOUNDED ITEM, Bariatric Rollator, NONFORMULARY OR COMPOUNDED ITEM, sertraline, furosemide, HYDROcodone-acetaminophen, traMADol, losartan, potassium chloride SA, and Omega-3 Fatty Acids (FISH OIL PO).  No orders of the defined types were placed in this encounter.   Problem List Items Addressed This Visit      Unprioritized   Right leg pain    r leg pain --- ? Coming from back Pt has appointment with Dr Sharol Given later this week       Other Visit Diagnoses    Cellulitis of leg, right    -  Primary   Relevant Orders   CBC with Differential/Platelet   Hemoglobin A1c   Comprehensive metabolic panel      Follow-up: Return in about 3 months (around 10/04/2016).  Ann Held, DO

## 2016-07-04 NOTE — Patient Instructions (Signed)
Edema °Edema is an abnormal buildup of fluids in your body tissues. Edema is somewhat dependent on gravity to pull the fluid to the lowest place in your body. That makes the condition more common in the legs and thighs (lower extremities). Painless swelling of the feet and ankles is common and becomes more likely as you get older. It is also common in looser tissues, like around your eyes.  °When the affected area is squeezed, the fluid may move out of that spot and leave a dent for a few moments. This dent is called pitting.  °CAUSES  °There are many possible causes of edema. Eating too much salt and being on your feet or sitting for a long time can cause edema in your legs and ankles. Hot weather may make edema worse. Common medical causes of edema include: °· Heart failure. °· Liver disease. °· Kidney disease. °· Weak blood vessels in your legs. °· Cancer. °· An injury. °· Pregnancy. °· Some medications. °· Obesity.  °SYMPTOMS  °Edema is usually painless. Your skin may look swollen or shiny.  °DIAGNOSIS  °Your health care provider may be able to diagnose edema by asking about your medical history and doing a physical exam. You may need to have tests such as X-rays, an electrocardiogram, or blood tests to check for medical conditions that may cause edema.  °TREATMENT  °Edema treatment depends on the cause. If you have heart, liver, or kidney disease, you need the treatment appropriate for these conditions. General treatment may include: °· Elevation of the affected body part above the level of your heart. °· Compression of the affected body part. Pressure from elastic bandages or support stockings squeezes the tissues and forces fluid back into the blood vessels. This keeps fluid from entering the tissues. °· Restriction of fluid and salt intake. °· Use of a water pill (diuretic). These medications are appropriate only for some types of edema. They pull fluid out of your body and make you urinate more often. This  gets rid of fluid and reduces swelling, but diuretics can have side effects. Only use diuretics as directed by your health care provider. °HOME CARE INSTRUCTIONS  °· Keep the affected body part above the level of your heart when you are lying down.   °· Do not sit still or stand for prolonged periods.   °· Do not put anything directly under your knees when lying down. °· Do not wear constricting clothing or garters on your upper legs.   °· Exercise your legs to work the fluid back into your blood vessels. This may help the swelling go down.   °· Wear elastic bandages or support stockings to reduce ankle swelling as directed by your health care provider.   °· Eat a low-salt diet to reduce fluid if your health care provider recommends it.   °· Only take medicines as directed by your health care provider.  °SEEK MEDICAL CARE IF:  °· Your edema is not responding to treatment. °· You have heart, liver, or kidney disease and notice symptoms of edema. °· You have edema in your legs that does not improve after elevating them.   °· You have sudden and unexplained weight gain. °SEEK IMMEDIATE MEDICAL CARE IF:  °· You develop shortness of breath or chest pain.   °· You cannot breathe when you lie down. °· You develop pain, redness, or warmth in the swollen areas.   °· You have heart, liver, or kidney disease and suddenly get edema. °· You have a fever and your symptoms suddenly get worse. °MAKE SURE YOU:  °·   Understand these instructions. °· Will watch your condition. °· Will get help right away if you are not doing well or get worse. °  °This information is not intended to replace advice given to you by your health care provider. Make sure you discuss any questions you have with your health care provider. °  °Document Released: 10/24/2005 Document Revised: 11/14/2014 Document Reviewed: 08/16/2013 °[employer]vier Interactive Patient Education ©2016 [employer] ° °

## 2016-07-04 NOTE — Assessment & Plan Note (Signed)
Elevate legs Take lasix 2 a day for 4-5 days Check cmp

## 2016-07-04 NOTE — Assessment & Plan Note (Signed)
r leg pain --- ? Coming from back Pt has appointment with Dr Sharol Given later this week

## 2016-07-05 LAB — CBC WITH DIFFERENTIAL/PLATELET
BASOS PCT: 0.6 % (ref 0.0–3.0)
Basophils Absolute: 0 10*3/uL (ref 0.0–0.1)
EOS ABS: 0.2 10*3/uL (ref 0.0–0.7)
EOS PCT: 3.3 % (ref 0.0–5.0)
HEMATOCRIT: 39.7 % (ref 36.0–46.0)
HEMOGLOBIN: 13.4 g/dL (ref 12.0–15.0)
LYMPHS PCT: 22.1 % (ref 12.0–46.0)
Lymphs Abs: 1.2 10*3/uL (ref 0.7–4.0)
MCHC: 33.7 g/dL (ref 30.0–36.0)
MCV: 97.7 fl (ref 78.0–100.0)
Monocytes Absolute: 0.8 10*3/uL (ref 0.1–1.0)
Monocytes Relative: 15 % — ABNORMAL HIGH (ref 3.0–12.0)
NEUTROS ABS: 3.2 10*3/uL (ref 1.4–7.7)
Neutrophils Relative %: 59 % (ref 43.0–77.0)
PLATELETS: 151 10*3/uL (ref 150.0–400.0)
RBC: 4.07 Mil/uL (ref 3.87–5.11)
RDW: 14.3 % (ref 11.5–15.5)
WBC: 5.4 10*3/uL (ref 4.0–10.5)

## 2016-07-05 LAB — COMPREHENSIVE METABOLIC PANEL
ALBUMIN: 3.7 g/dL (ref 3.5–5.2)
ALK PHOS: 41 U/L (ref 39–117)
ALT: 9 U/L (ref 0–35)
AST: 14 U/L (ref 0–37)
BUN: 18 mg/dL (ref 6–23)
CALCIUM: 8.5 mg/dL (ref 8.4–10.5)
CHLORIDE: 105 meq/L (ref 96–112)
CO2: 29 mEq/L (ref 19–32)
Creatinine, Ser: 0.74 mg/dL (ref 0.40–1.20)
GFR: 79.47 mL/min (ref >60.00)
Glucose, Bld: 93 mg/dL (ref 70–99)
POTASSIUM: 3.9 meq/L (ref 3.5–5.1)
Sodium: 139 mEq/L (ref 135–145)
TOTAL PROTEIN: 7 g/dL (ref 6.0–8.3)
Total Bilirubin: 0.3 mg/dL (ref 0.2–1.2)

## 2016-07-05 LAB — HEMOGLOBIN A1C: Hgb A1c MFr Bld: 5.2 % (ref 4.6–6.5)

## 2016-07-07 DIAGNOSIS — M5441 Lumbago with sciatica, right side: Secondary | ICD-10-CM | POA: Diagnosis not present

## 2016-07-31 ENCOUNTER — Emergency Department (HOSPITAL_COMMUNITY): Payer: Medicare Other

## 2016-07-31 ENCOUNTER — Encounter (HOSPITAL_COMMUNITY): Payer: Self-pay | Admitting: *Deleted

## 2016-07-31 ENCOUNTER — Inpatient Hospital Stay (HOSPITAL_COMMUNITY)
Admission: EM | Admit: 2016-07-31 | Discharge: 2016-08-10 | DRG: 308 | Disposition: A | Discharge status: Skilled Nursing Facility | Payer: Medicare Other | Attending: Internal Medicine | Admitting: Internal Medicine

## 2016-07-31 DIAGNOSIS — I110 Hypertensive heart disease with heart failure: Secondary | ICD-10-CM | POA: Diagnosis not present

## 2016-07-31 DIAGNOSIS — I4891 Unspecified atrial fibrillation: Principal | ICD-10-CM | POA: Diagnosis present

## 2016-07-31 DIAGNOSIS — R296 Repeated falls: Secondary | ICD-10-CM | POA: Diagnosis present

## 2016-07-31 DIAGNOSIS — R52 Pain, unspecified: Secondary | ICD-10-CM

## 2016-07-31 DIAGNOSIS — M79659 Pain in unspecified thigh: Secondary | ICD-10-CM | POA: Diagnosis present

## 2016-07-31 DIAGNOSIS — J209 Acute bronchitis, unspecified: Secondary | ICD-10-CM | POA: Diagnosis not present

## 2016-07-31 DIAGNOSIS — F329 Major depressive disorder, single episode, unspecified: Secondary | ICD-10-CM | POA: Diagnosis present

## 2016-07-31 DIAGNOSIS — S92411A Displaced fracture of proximal phalanx of right great toe, initial encounter for closed fracture: Secondary | ICD-10-CM | POA: Diagnosis not present

## 2016-07-31 DIAGNOSIS — Y9301 Activity, walking, marching and hiking: Secondary | ICD-10-CM | POA: Diagnosis present

## 2016-07-31 DIAGNOSIS — I5033 Acute on chronic diastolic (congestive) heart failure: Secondary | ICD-10-CM | POA: Diagnosis present

## 2016-07-31 DIAGNOSIS — I472 Ventricular tachycardia: Secondary | ICD-10-CM | POA: Diagnosis not present

## 2016-07-31 DIAGNOSIS — I5032 Chronic diastolic (congestive) heart failure: Secondary | ICD-10-CM | POA: Diagnosis present

## 2016-07-31 DIAGNOSIS — Z79899 Other long term (current) drug therapy: Secondary | ICD-10-CM

## 2016-07-31 DIAGNOSIS — I5031 Acute diastolic (congestive) heart failure: Secondary | ICD-10-CM | POA: Diagnosis not present

## 2016-07-31 DIAGNOSIS — T148 Other injury of unspecified body region: Secondary | ICD-10-CM | POA: Diagnosis not present

## 2016-07-31 DIAGNOSIS — F419 Anxiety disorder, unspecified: Secondary | ICD-10-CM | POA: Diagnosis present

## 2016-07-31 DIAGNOSIS — E785 Hyperlipidemia, unspecified: Secondary | ICD-10-CM | POA: Diagnosis present

## 2016-07-31 DIAGNOSIS — G4733 Obstructive sleep apnea (adult) (pediatric): Secondary | ICD-10-CM | POA: Diagnosis not present

## 2016-07-31 DIAGNOSIS — M79652 Pain in left thigh: Secondary | ICD-10-CM | POA: Diagnosis not present

## 2016-07-31 DIAGNOSIS — Y95 Nosocomial condition: Secondary | ICD-10-CM | POA: Diagnosis not present

## 2016-07-31 DIAGNOSIS — W010XXA Fall on same level from slipping, tripping and stumbling without subsequent striking against object, initial encounter: Secondary | ICD-10-CM | POA: Diagnosis present

## 2016-07-31 DIAGNOSIS — E873 Alkalosis: Secondary | ICD-10-CM | POA: Diagnosis not present

## 2016-07-31 DIAGNOSIS — R0902 Hypoxemia: Secondary | ICD-10-CM | POA: Diagnosis not present

## 2016-07-31 DIAGNOSIS — Z96653 Presence of artificial knee joint, bilateral: Secondary | ICD-10-CM | POA: Diagnosis present

## 2016-07-31 DIAGNOSIS — J189 Pneumonia, unspecified organism: Secondary | ICD-10-CM | POA: Diagnosis not present

## 2016-07-31 DIAGNOSIS — S92911A Unspecified fracture of right toe(s), initial encounter for closed fracture: Secondary | ICD-10-CM

## 2016-07-31 DIAGNOSIS — I10 Essential (primary) hypertension: Secondary | ICD-10-CM | POA: Diagnosis not present

## 2016-07-31 DIAGNOSIS — Z9981 Dependence on supplemental oxygen: Secondary | ICD-10-CM | POA: Diagnosis not present

## 2016-07-31 DIAGNOSIS — E860 Dehydration: Secondary | ICD-10-CM | POA: Diagnosis not present

## 2016-07-31 DIAGNOSIS — R062 Wheezing: Secondary | ICD-10-CM | POA: Diagnosis not present

## 2016-07-31 DIAGNOSIS — Z9181 History of falling: Secondary | ICD-10-CM | POA: Diagnosis not present

## 2016-07-31 DIAGNOSIS — W1830XA Fall on same level, unspecified, initial encounter: Secondary | ICD-10-CM | POA: Diagnosis present

## 2016-07-31 DIAGNOSIS — S92311A Displaced fracture of first metatarsal bone, right foot, initial encounter for closed fracture: Secondary | ICD-10-CM | POA: Diagnosis present

## 2016-07-31 DIAGNOSIS — E876 Hypokalemia: Secondary | ICD-10-CM | POA: Diagnosis not present

## 2016-07-31 DIAGNOSIS — Z6841 Body Mass Index (BMI) 40.0 and over, adult: Secondary | ICD-10-CM

## 2016-07-31 DIAGNOSIS — J90 Pleural effusion, not elsewhere classified: Secondary | ICD-10-CM | POA: Diagnosis not present

## 2016-07-31 DIAGNOSIS — M79604 Pain in right leg: Secondary | ICD-10-CM | POA: Diagnosis not present

## 2016-07-31 DIAGNOSIS — F32A Depression, unspecified: Secondary | ICD-10-CM | POA: Diagnosis present

## 2016-07-31 DIAGNOSIS — M79651 Pain in right thigh: Secondary | ICD-10-CM | POA: Diagnosis not present

## 2016-07-31 DIAGNOSIS — Y92008 Other place in unspecified non-institutional (private) residence as the place of occurrence of the external cause: Secondary | ICD-10-CM

## 2016-07-31 DIAGNOSIS — G894 Chronic pain syndrome: Secondary | ICD-10-CM

## 2016-07-31 DIAGNOSIS — E662 Morbid (severe) obesity with alveolar hypoventilation: Secondary | ICD-10-CM | POA: Diagnosis present

## 2016-07-31 NOTE — ED Provider Notes (Signed)
[facility] DEPT Provider Note   CSN: [id number] Arrival date & time: 07/31/16  2245  By signing my name below, I, [name], attest that this documentation has been prepared under the direction and in the presence of Ripley Fraise, MD. Electronically Signed: [name], Education administrator. 07/31/16. 11:42 PM.  History   Chief Complaint Chief Complaint  Patient presents with  . Leg Pain    bilat thigh pain    The history is provided by the patient and a relative. No language interpreter was used.  Fall  This is a recurrent problem. The current episode started 1 to 2 hours ago. Pertinent negatives include no chest pain, no abdominal pain, no headaches and no shortness of breath.  Leg Pain   This is a new problem. The current episode started yesterday. The problem occurs constantly. The problem has not changed since onset.The pain is present in the left upper leg and right upper leg.     HPI Comments: Norma Ford is a 82 y.o. female who presents to the Emergency Department complaining of a fall that occurred while walking to the bathroom PTA. Patient states she fell due to pain to bilateral thighs and reports current right great toe pain that began after the fall. Patient denies being on the ground for a prolonged period of time. Per daughter, patient had a fall a few weeks ago and was seen at [facility] ED; had negative imaging and lab results and was started on steroids due to a possible "pinched nerve". She states the steroids helped resolve her groin pain. She denies fever, vomiting, CP, back pain, abdominal pain, HA, dysuria, cough, SOB. Patient states she lives with her son and daughter-in-law.  Past Medical History:  Diagnosis Date  . Adrenal tumor 2002   right, followed by Surgery  . Barrett's esophagus 1999  . Cataracts, bilateral   . DECUBITUS ULCER, BUTTOCK 12/21/2009  . History of abnormal mammogram    2001-h/o abnormal mammo-told just calcium deposit-repeat  . HTN (hypertension)     . Hyperlipidemia   . Kidney stone   . MYCOSIS FUNGOIDES LYMPH NODES MULTIPLE SITES 09/21/2009  . THROMBOCYTOPENIA 12/21/2009    Patient Active Problem List   Diagnosis Date Noted  . Right leg pain 07/01/2016  . Frequent falls 02/14/2016  . History of fall 11/14/2015  . Osteoarthritis of both knees 05/15/2015  . Severe obesity (BMI >= 40) ([place]) 02/24/2014  . Cellulitis and abscess of lower leg 05/21/2012  . Calf pain 05/21/2012  . Edema 05/21/2012  . Chronic bronchitis 11/03/2011  . Hypoxia 10/20/2011  . THROMBOCYTOPENIA 12/21/2009  . DECUBITUS ULCER, BUTTOCK 12/21/2009  . MYCOSIS FUNGOIDES LYMPH NODES MULTIPLE SITES 09/21/2009  . DEGENERATIVE JOINT DISEASE, KNEE 09/21/2009  . SHOULDER PAIN, RIGHT 09/21/2009  . FEMALE STRESS INCONTINENCE 01/16/2009  . HYPERLIPIDEMIA 02/12/2008  . Essential hypertension 02/12/2008    Past Surgical History:  Procedure Laterality Date  . ABDOMINAL HYSTERECTOMY    . appendectomy    . ESOPHAGOGASTRODUODENOSCOPY  03/2002  . TONSILLECTOMY    . TOTAL KNEE ARTHROPLASTY     left 2009, right 2003  . tubal ligation      OB History    No data available       Home Medications    Prior to Admission medications   Medication Sig Start Date End Date Taking? Authorizing Provider  clotrimazole-betamethasone (LOTRISONE) cream APPLY ONE APPLICATION TOPICALLY TWO TIMES DAILY 04/14/15   Wallene Huh, DPM  furosemide (LASIX) 40 MG tablet Take  1 tablet by mouth two  times daily 04/22/16   Rosalita Chessman Chase, DO  HYDROcodone-acetaminophen (NORCO/VICODIN) 5-325 MG tablet Take 1 tablet by mouth every 6 (six) hours as needed for moderate pain. 04/28/16   Rosalita Chessman Chase, DO  losartan (COZAAR) 50 MG tablet Take 1 tablet by mouth  daily 06/13/16   Rosalita Chessman Chase, DO  metoprolol succinate (TOPROL-XL) 25 MG 24 hr tablet Take 1 tablet (25 mg total) by mouth daily. 06/16/15   [place]. 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 Dx: 02/26/16   Ann Held, DO  Misc. Devices (COMMODE BEDSIDE) MISC Bariatric Bedside commode 05/26/15   Alferd Apa Lowne Chase, DO  mometasone (NASONEX) 50 MCG/ACT nasal spray Place 2 sprays into the nose daily.      Historical Provider, MD  Multiple Vitamin (MULTIVITAMIN) tablet Take 1 tablet by mouth daily.      Historical Provider, MD  NONFORMULARY OR COMPOUNDED ITEM Wheelchair, manual #1  As directed---dx osteoarthritis, frequent falls 02/11/16   Ann Held, DO  NONFORMULARY OR COMPOUNDED ITEM Lift Chair to use at home as needed 02/26/16   Ann Held, DO  Omega-3 Fatty Acids (FISH OIL PO) Take 1 tablet by mouth 2 (two) times daily.    Historical Provider, MD  polyethylene glycol powder (GLYCOLAX/MIRALAX) powder Take 17 g by mouth 2 (two) times daily as needed. 07/24/15   Rosalita Chessman Chase, DO  potassium chloride SA (K-DUR,KLOR-CON) 20 MEQ tablet Take 1 tablet by mouth two  times daily 06/13/16   Rosalita Chessman Chase, DO  sertraline (ZOLOFT) 50 MG tablet Take 1 tablet by mouth  daily 04/22/16   Rosalita Chessman Chase, DO  simvastatin (ZOCOR) 80 MG tablet Take 1 tablet by mouth at  bedtime 08/03/15   Rosalita Chessman Chase, DO  traMADol (ULTRAM) 50 MG tablet Take 1 tablet (50 mg total) by mouth daily. 06/03/16   Ann Held, DO    Family History Family History  Problem Relation Age of Onset  . Heart disease Mother   . Heart disease Father     pacemaker  . Kidney disease Father     Social History Social History  Substance Use Topics  . Smoking status: Never Smoker  . Smokeless tobacco: Never Used  . Alcohol use Not on file     Allergies   Codeine   Review of Systems Review of Systems  Constitutional: Negative for fever.  Respiratory: Negative for cough and shortness of breath.   Cardiovascular: Negative for chest pain.  Gastrointestinal: Negative for abdominal pain and vomiting.  Genitourinary: Negative for dysuria.    Musculoskeletal: Positive for arthralgias and myalgias. Negative for back pain.  Neurological: Negative for headaches.  All other systems reviewed and are negative.    Physical Exam Updated Vital Signs BP 136/78 (BP Location: Right Arm)   Pulse (!) 132   Temp 98.2 F (36.8 C) (Oral)   Resp 20   Ht 5\' 4"  (1.626 m)   Wt 260 lb (117.9 kg)   SpO2 97%   BMI 44.63 kg/m   Physical Exam  CONSTITUTIONAL: Elderly, no acute distress HEAD: Normocephalic/atraumatic EYES: EOMI ENMT: Mucous membranes moist NECK: supple no meningeal signs SPINE/BACK:entire spine nontender CV: S1/S2 noted, no murmurs/rubs/gallops noted, tachycardic  LUNGS: Lungs are clear to auscultation bilaterally, no apparent distress ABDOMEN: soft, nontender, no rebound or guarding, bowel sounds  noted throughout abdomen, obese GU:no cva tenderness NEURO: Pt is awake/alert/appropriate, equal hand grips, movement in upper extremities limited due to chronic shoulder pain.  Decreased movement to BLE which is chronic. No facial droop.   EXTREMITIES: pulses normal/equal, full ROM, chronic edema to BLE with increased erythema in right foot. Tenderness to palpation to bilateral thighs and right toe.  SKIN: warm, color normal PSYCH: no abnormalities of mood noted, alert and oriented to situation  ED Treatments / Results  DIAGNOSTIC STUDIES: Oxygen Saturation is 97% on RA, adequate by my interpretation.    COORDINATION OF CARE: 11:09 PM Discussed treatment plan with pt and family at bedside and they agreed to plan.    Labs (all labs ordered are listed, but only abnormal results are displayed) Labs Reviewed  BASIC METABOLIC PANEL - Abnormal; Notable for the following:       Result Value   Glucose, Bld 108 (*)    BUN 25 (*)    All other components within normal limits  CBC WITH DIFFERENTIAL/PLATELET - Abnormal; Notable for the following:    RBC 3.74 (*)    All other components within normal limits  TROPONIN I     EKG  EKG Interpretation  Date/Time:  Monday August 01 2016 00:16:55 EDT Ventricular Rate:  133 PR Interval:    QRS Duration: 88 QT Interval:  320 QTC Calculation: 476 R Axis:   -53 Text Interpretation:  Atrial fibrillation Left anterior fascicular block Consider anterior infarct ST depression, probably rate related Artifact in lead(s) I II III aVR aVL afib is new in onset Confirmed by Christy Gentles  MD, [place] ([id number]) on 08/01/2016 12:25:54 AM       Radiology Dg Chest Portable 1 View  Result Date: 08/01/2016 CLINICAL DATA:  Generalized weakness for 2 weeks. Fell at home tonight. Leg pain. History of hypertension, hyperlipidemia. EXAM: PORTABLE CHEST 1 VIEW COMPARISON:  Chest radiograph November 10, 2014 FINDINGS: Cardiac silhouette is mildly enlarged, even with CAD considerations low inspiratory examination. Pulmonary vascular congestion interstitial prominence. Small bilateral pleural effusions. No pneumothorax. Severe degenerative change of the shoulders. IMPRESSION: Mild cardiomegaly.  Interstitial edema with small pleural effusions. Electronically Signed   By: Elon Alas M.D.   On: 08/01/2016 01:51   Dg Foot Complete Right  Result Date: 08/01/2016 CLINICAL DATA:  Acute onset of right foot pain, especially at the great toe, after fall. Initial encounter. EXAM: RIGHT FOOT COMPLETE - 3+ VIEW COMPARISON:  None. FINDINGS: There is a comminuted fracture involving the first proximal phalanx, with 2 fracture lines extending to the first metatarsophalangeal joint, and mild impaction of a central fragment. No additional fractures are seen. There is no evidence of talar subluxation; the subtalar joint is unremarkable in appearance. A plantar calcaneal spur is noted. Diffuse dorsal soft tissue swelling is noted at the foot, with scattered soft tissue calcifications. IMPRESSION: 1. Comminuted fracture of the first proximal phalanx, with 2 fracture lines extending to the first metatarsophalangeal  joint, and mild impaction of a central fragment. 2. Diffuse dorsal soft tissue swelling, with scattered soft tissue calcifications. Electronically Signed   By: Garald Balding M.D.   On: 08/01/2016 01:01   Dg Femur Min 2 Views Left  Result Date: 08/01/2016 CLINICAL DATA:  82 year old female with bilateral leg pain and fall. EXAM: LEFT FEMUR 2 VIEWS COMPARISON:  Right femur radiograph dated 07/31/2016 FINDINGS: There is no acute fracture or dislocation. The bones are osteopenic. There is a total left knee arthroplasty. There are osteoarthritic changes of  the left hip. There is spurring of the superior patella along the insertion of the quadriceps tendon. No significant joint effusion. The soft tissues are grossly unremarkable. IMPRESSION: No acute fracture or dislocation. Electronically Signed   By: Anner Crete M.D.   On: 08/01/2016 00:59   Dg Femur, Min 2 Views Right  Result Date: 08/01/2016 CLINICAL DATA:  Status post fall, with bilateral femur pain. Initial encounter. EXAM: RIGHT FEMUR 2 VIEWS COMPARISON:  None. FINDINGS: There is no evidence of fracture or dislocation. The right femur appears grossly intact. The right hip joint is grossly unremarkable. The patient's right knee arthroplasty is grossly unremarkable in appearance, without evidence of loosening. No definite soft tissue abnormalities are characterized on radiograph. No knee joint effusion is seen. IMPRESSION: No evidence of fracture or dislocation. Right knee arthroplasty is unremarkable in appearance. Electronically Signed   By: Garald Balding M.D.   On: 08/01/2016 01:03    Procedures Procedures  CRITICAL CARE Performed by: Sharyon Cable Total critical care time: 35 minutes Critical care time was exclusive of separately billable procedures and treating other patients. Critical care was necessary to treat or prevent imminent or life-threatening deterioration. Critical care was time spent personally by me on the following  activities: development of treatment plan with patient and/or surrogate as well as nursing, discussions with consultants, evaluation of patient's response to treatment, examination of patient, obtaining history from patient or surrogate, ordering and performing treatments and interventions, ordering and review of laboratory studies, ordering and review of radiographic studies, pulse oximetry and re-evaluation of patient's condition. PATIENT REQUIRING CARDIZEM DRIP DUE TO ATRIAL FIBRILLATION AND TACHYCARDIA AND SHE IS ADMITTED TO STEPDOWN UNIT  Medications Ordered in ED Medications  diltiazem (CARDIZEM) 100 mg in dextrose 5 % 100 mL (1 mg/mL) infusion (7.5 mg/hr Intravenous Rate/Dose Change 08/01/16 0136)     Initial Impression / Assessment and Plan / ED Course  I have reviewed the triage vital signs and the nursing notes.  Pertinent labs & imaging results that were available during my care of the patient were reviewed by me and considered in my medical decision making (see chart for details).  Clinical Course    On repeat exam, pt found to be in atrial fibrillation This is new for patient Unclear when this began Will need admission For her leg weakness/pain, will need further evaluation but this appears to worsening of chronic issue    This patients CHA2DS2-VASc Score and unadjusted Ischemic Stroke Rate (% per year) is equal to 4.8 % stroke rate/year from a score of 4  Above score calculated as 1 point each if present [CHF, HTN, DM, Vascular=MI/PAD/Aortic Plaque, Age if 65-74, or Female] Above score calculated as 2 points each if present [Age > 75, or Stroke/TIA/TE]  2:17 AM Pt stabilized Will need admission for new onset afib Advised buddy tape for toe fx No other acute injury noted D/w dr Blaine Hamper for admission   Final Clinical Impressions(s) / ED Diagnoses   Final diagnoses:  Pain  Atrial fibrillation with rapid ventricular response (HCC)  Toe fracture, right, closed, initial  encounter    New Prescriptions New Prescriptions   No medications on file    I personally performed the services described in this documentation, which was scribed in my presence. The recorded information has been reviewed and is accurate.        Ripley Fraise, MD 08/01/16 [id number]

## 2016-07-31 NOTE — ED Notes (Signed)
Pt presents with redness to right foot, bilat edema to feet.  Pt stated "I went to a foot doctor.  I don't remember when I saw them last.  I know I'm overdue."

## 2016-07-31 NOTE — ED Triage Notes (Signed)
Per [facility], pt c/o bilat thigh pain that started yesterday.  Pt denied pain on stretcher but  C/o pain when ambulating & lowered herself to the floor d/t the pain.  She also c/o right great toe pain when she turned.

## 2016-07-31 NOTE — ED Notes (Signed)
Pt placed on monitor.  

## 2016-07-31 NOTE — ED Notes (Signed)
Bed: WA01 Expected date:  Expected time:  Means of arrival:  Comments: EMS 82 yo female increased pain lateral thigh/lower back-fall

## 2016-08-01 ENCOUNTER — Emergency Department (HOSPITAL_COMMUNITY): Payer: Medicare Other

## 2016-08-01 ENCOUNTER — Inpatient Hospital Stay (HOSPITAL_COMMUNITY): Payer: Medicare Other

## 2016-08-01 ENCOUNTER — Encounter (HOSPITAL_COMMUNITY): Payer: Self-pay

## 2016-08-01 DIAGNOSIS — S92911A Unspecified fracture of right toe(s), initial encounter for closed fracture: Secondary | ICD-10-CM

## 2016-08-01 DIAGNOSIS — I472 Ventricular tachycardia: Secondary | ICD-10-CM | POA: Diagnosis not present

## 2016-08-01 DIAGNOSIS — R262 Difficulty in walking, not elsewhere classified: Secondary | ICD-10-CM | POA: Diagnosis not present

## 2016-08-01 DIAGNOSIS — W19XXXA Unspecified fall, initial encounter: Secondary | ICD-10-CM | POA: Insufficient documentation

## 2016-08-01 DIAGNOSIS — I4891 Unspecified atrial fibrillation: Principal | ICD-10-CM

## 2016-08-01 DIAGNOSIS — Y95 Nosocomial condition: Secondary | ICD-10-CM | POA: Diagnosis not present

## 2016-08-01 DIAGNOSIS — R0902 Hypoxemia: Secondary | ICD-10-CM | POA: Diagnosis not present

## 2016-08-01 DIAGNOSIS — R062 Wheezing: Secondary | ICD-10-CM | POA: Diagnosis not present

## 2016-08-01 DIAGNOSIS — I10 Essential (primary) hypertension: Secondary | ICD-10-CM | POA: Diagnosis not present

## 2016-08-01 DIAGNOSIS — E662 Morbid (severe) obesity with alveolar hypoventilation: Secondary | ICD-10-CM | POA: Diagnosis present

## 2016-08-01 DIAGNOSIS — E876 Hypokalemia: Secondary | ICD-10-CM | POA: Diagnosis not present

## 2016-08-01 DIAGNOSIS — J189 Pneumonia, unspecified organism: Secondary | ICD-10-CM | POA: Diagnosis not present

## 2016-08-01 DIAGNOSIS — Z9981 Dependence on supplemental oxygen: Secondary | ICD-10-CM | POA: Diagnosis not present

## 2016-08-01 DIAGNOSIS — S92401D Displaced unspecified fracture of right great toe, subsequent encounter for fracture with routine healing: Secondary | ICD-10-CM | POA: Diagnosis not present

## 2016-08-01 DIAGNOSIS — M79652 Pain in left thigh: Secondary | ICD-10-CM | POA: Diagnosis present

## 2016-08-01 DIAGNOSIS — F419 Anxiety disorder, unspecified: Secondary | ICD-10-CM | POA: Diagnosis present

## 2016-08-01 DIAGNOSIS — G4733 Obstructive sleep apnea (adult) (pediatric): Secondary | ICD-10-CM | POA: Diagnosis present

## 2016-08-01 DIAGNOSIS — S92411A Displaced fracture of proximal phalanx of right great toe, initial encounter for closed fracture: Secondary | ICD-10-CM | POA: Diagnosis not present

## 2016-08-01 DIAGNOSIS — F329 Major depressive disorder, single episode, unspecified: Secondary | ICD-10-CM | POA: Diagnosis not present

## 2016-08-01 DIAGNOSIS — E785 Hyperlipidemia, unspecified: Secondary | ICD-10-CM | POA: Diagnosis not present

## 2016-08-01 DIAGNOSIS — Z79899 Other long term (current) drug therapy: Secondary | ICD-10-CM | POA: Diagnosis not present

## 2016-08-01 DIAGNOSIS — S92311A Displaced fracture of first metatarsal bone, right foot, initial encounter for closed fracture: Secondary | ICD-10-CM | POA: Diagnosis present

## 2016-08-01 DIAGNOSIS — M79604 Pain in right leg: Secondary | ICD-10-CM | POA: Diagnosis not present

## 2016-08-01 DIAGNOSIS — I5033 Acute on chronic diastolic (congestive) heart failure: Secondary | ICD-10-CM | POA: Diagnosis not present

## 2016-08-01 DIAGNOSIS — Z6841 Body Mass Index (BMI) 40.0 and over, adult: Secondary | ICD-10-CM | POA: Diagnosis not present

## 2016-08-01 DIAGNOSIS — M79651 Pain in right thigh: Secondary | ICD-10-CM | POA: Diagnosis not present

## 2016-08-01 DIAGNOSIS — J209 Acute bronchitis, unspecified: Secondary | ICD-10-CM | POA: Diagnosis not present

## 2016-08-01 DIAGNOSIS — R296 Repeated falls: Secondary | ICD-10-CM | POA: Diagnosis not present

## 2016-08-01 DIAGNOSIS — Z9181 History of falling: Secondary | ICD-10-CM | POA: Diagnosis not present

## 2016-08-01 DIAGNOSIS — Z96653 Presence of artificial knee joint, bilateral: Secondary | ICD-10-CM | POA: Diagnosis present

## 2016-08-01 DIAGNOSIS — I480 Paroxysmal atrial fibrillation: Secondary | ICD-10-CM | POA: Insufficient documentation

## 2016-08-01 DIAGNOSIS — I5031 Acute diastolic (congestive) heart failure: Secondary | ICD-10-CM | POA: Diagnosis not present

## 2016-08-01 DIAGNOSIS — Y9301 Activity, walking, marching and hiking: Secondary | ICD-10-CM | POA: Diagnosis present

## 2016-08-01 DIAGNOSIS — M6281 Muscle weakness (generalized): Secondary | ICD-10-CM | POA: Diagnosis not present

## 2016-08-01 DIAGNOSIS — J90 Pleural effusion, not elsewhere classified: Secondary | ICD-10-CM | POA: Diagnosis not present

## 2016-08-01 DIAGNOSIS — F32A Depression, unspecified: Secondary | ICD-10-CM | POA: Diagnosis present

## 2016-08-01 DIAGNOSIS — Y92008 Other place in unspecified non-institutional (private) residence as the place of occurrence of the external cause: Secondary | ICD-10-CM | POA: Diagnosis not present

## 2016-08-01 DIAGNOSIS — I110 Hypertensive heart disease with heart failure: Secondary | ICD-10-CM | POA: Diagnosis present

## 2016-08-01 DIAGNOSIS — E873 Alkalosis: Secondary | ICD-10-CM | POA: Diagnosis not present

## 2016-08-01 DIAGNOSIS — W1830XA Fall on same level, unspecified, initial encounter: Secondary | ICD-10-CM | POA: Diagnosis present

## 2016-08-01 DIAGNOSIS — I482 Chronic atrial fibrillation: Secondary | ICD-10-CM | POA: Diagnosis not present

## 2016-08-01 DIAGNOSIS — M79659 Pain in unspecified thigh: Secondary | ICD-10-CM | POA: Diagnosis present

## 2016-08-01 DIAGNOSIS — W010XXA Fall on same level from slipping, tripping and stumbling without subsequent striking against object, initial encounter: Secondary | ICD-10-CM | POA: Diagnosis present

## 2016-08-01 DIAGNOSIS — E860 Dehydration: Secondary | ICD-10-CM | POA: Diagnosis not present

## 2016-08-01 LAB — PROTIME-INR
INR: 1.02
PROTHROMBIN TIME: 13.4 s (ref 11.4–15.2)

## 2016-08-01 LAB — MAGNESIUM: Magnesium: 2.1 mg/dL (ref 1.7–2.4)

## 2016-08-01 LAB — TROPONIN I
Troponin I: <0.03 ng/mL (ref <0.03)
Troponin I: <0.03 ng/mL (ref <0.03)
Troponin I: <0.03 ng/mL (ref <0.03)

## 2016-08-01 LAB — CBC
HCT: 38 % (ref 36.0–46.0)
Hemoglobin: 12.4 g/dL (ref 12.0–15.0)
MCH: 32.1 pg (ref 26.0–34.0)
MCHC: 32.6 g/dL (ref 30.0–36.0)
MCV: 98.4 fL (ref 78.0–100.0)
PLATELETS: 174 10*3/uL (ref 150–400)
RBC: 3.86 MIL/uL — AB (ref 3.87–5.11)
RDW: 14.4 % (ref 11.5–15.5)
WBC: 6.9 10*3/uL (ref 4.0–10.5)

## 2016-08-01 LAB — BASIC METABOLIC PANEL
Anion gap: 7 (ref 5–15)
Anion gap: 7 (ref 5–15)
BUN: 24 mg/dL — AB (ref 6–20)
BUN: 25 mg/dL — AB (ref 4–21)
BUN: 25 mg/dL — ABNORMAL HIGH (ref 6–20)
CALCIUM: 8.9 mg/dL (ref 8.9–10.3)
CHLORIDE: 106 mmol/L (ref 101–111)
CO2: 26 mmol/L (ref 22–32)
CO2: 26 mmol/L (ref 22–32)
CREATININE: 0.78 mg/dL (ref 0.44–1.00)
CREATININE: 0.9 mg/dL (ref 0.5–1.1)
Calcium: 9 mg/dL (ref 8.9–10.3)
Chloride: 106 mmol/L (ref 101–111)
Creatinine, Ser: 0.85 mg/dL (ref 0.44–1.00)
GFR calc Af Amer: >60 mL/min (ref >60)
GFR calc Af Amer: >60 mL/min (ref >60)
GFR calc non Af Amer: >60 mL/min (ref >60)
GLUCOSE: 99 mg/dL (ref 65–99)
Glucose, Bld: 108 mg/dL — ABNORMAL HIGH (ref 65–99)
Glucose: 108 mg/dL
POTASSIUM: 4.2 mmol/L (ref 3.5–5.1)
Potassium: 4 mmol/L (ref 3.5–5.1)
Potassium: 4.2 mmol/L (ref 3.4–5.3)
SODIUM: 139 mmol/L (ref 137–147)
Sodium: 139 mmol/L (ref 135–145)
Sodium: 139 mmol/L (ref 135–145)

## 2016-08-01 LAB — CBC WITH DIFFERENTIAL/PLATELET
BASOS ABS: 0 10*3/uL (ref 0.0–0.1)
BASOS PCT: 0 %
Eosinophils Absolute: 0.2 10*3/uL (ref 0.0–0.7)
Eosinophils Relative: 2 %
HEMATOCRIT: 36.9 % (ref 36.0–46.0)
HEMOGLOBIN: 12.1 g/dL (ref 12.0–15.0)
Lymphocytes Relative: 12 %
Lymphs Abs: 0.8 10*3/uL (ref 0.7–4.0)
MCH: 32.4 pg (ref 26.0–34.0)
MCHC: 32.8 g/dL (ref 30.0–36.0)
MCV: 98.7 fL (ref 78.0–100.0)
MONO ABS: 0.7 10*3/uL (ref 0.1–1.0)
Monocytes Relative: 11 %
NEUTROS ABS: 4.9 10*3/uL (ref 1.7–7.7)
NEUTROS PCT: 75 %
Platelets: 168 10*3/uL (ref 150–400)
RBC: 3.74 MIL/uL — ABNORMAL LOW (ref 3.87–5.11)
RDW: 14.4 % (ref 11.5–15.5)
WBC: 6.6 10*3/uL (ref 4.0–10.5)

## 2016-08-01 LAB — MRSA PCR SCREENING: MRSA BY PCR: NEGATIVE

## 2016-08-01 LAB — T4, FREE: Free T4: 1.35 ng/dL — ABNORMAL HIGH (ref 0.61–1.12)

## 2016-08-01 LAB — TSH: TSH: 1.812 u[IU]/mL (ref 0.350–4.500)

## 2016-08-01 LAB — CBC AND DIFFERENTIAL
HEMATOCRIT: 37 % (ref 36–46)
Hemoglobin: 12.1 g/dL (ref 12.0–16.0)
PLATELETS: 168 10*3/uL (ref 150–399)
WBC: 6.6 10^3/mL

## 2016-08-01 LAB — GLUCOSE, CAPILLARY: GLUCOSE-CAPILLARY: 101 mg/dL — AB (ref 65–99)

## 2016-08-01 LAB — ECHOCARDIOGRAM COMPLETE
HEIGHTINCHES: 62 in
Weight: 4737.24 oz

## 2016-08-01 LAB — LIPID PANEL
CHOL/HDL RATIO: 4.3 ratio
Cholesterol: 162 mg/dL (ref 0–200)
HDL: 38 mg/dL — AB (ref >40)
LDL CALC: 110 mg/dL — AB (ref 0–99)
TRIGLYCERIDES: 69 mg/dL (ref <150)
VLDL: 14 mg/dL (ref 0–40)

## 2016-08-01 LAB — APTT: APTT: 23 s — AB (ref 24–36)

## 2016-08-01 LAB — BRAIN NATRIURETIC PEPTIDE: B Natriuretic Peptide: 177.2 pg/mL — ABNORMAL HIGH (ref 0.0–100.0)

## 2016-08-01 MED ORDER — METHOCARBAMOL 1000 MG/10ML IJ SOLN
500.0000 mg | Freq: Three times a day (TID) | INTRAVENOUS | Status: DC | PRN
  Filled 2016-08-01: qty 5

## 2016-08-01 MED ORDER — ADULT MULTIVITAMIN W/MINERALS CH
1.0000 | ORAL_TABLET | Freq: Every day | ORAL | Status: DC
  Administered 2016-08-01 – 2016-08-10 (×10): 1 via ORAL
  Filled 2016-08-01 (×10): qty 1

## 2016-08-01 MED ORDER — ACETAMINOPHEN 325 MG PO TABS
650.0000 mg | ORAL_TABLET | ORAL | Status: DC | PRN
  Administered 2016-08-02 – 2016-08-05 (×2): 650 mg via ORAL
  Filled 2016-08-01: qty 2

## 2016-08-01 MED ORDER — FUROSEMIDE 40 MG PO TABS
40.0000 mg | ORAL_TABLET | Freq: Two times a day (BID) | ORAL | Status: DC
  Administered 2016-08-01 – 2016-08-04 (×8): 40 mg via ORAL
  Filled 2016-08-01 (×8): qty 1

## 2016-08-01 MED ORDER — POLYETHYLENE GLYCOL 3350 17 GM/SCOOP PO POWD: 17.0000 g | Freq: Two times a day (BID) | ORAL | Status: DC | PRN

## 2016-08-01 MED ORDER — ASPIRIN EC 325 MG PO TBEC
325.0000 mg | DELAYED_RELEASE_TABLET | Freq: Every day | ORAL | Status: DC
  Administered 2016-08-01 – 2016-08-10 (×10): 325 mg via ORAL
  Filled 2016-08-01 (×10): qty 1

## 2016-08-01 MED ORDER — MORPHINE SULFATE (PF) 2 MG/ML IV SOLN: 2.0000 mg | INTRAVENOUS | Status: DC | PRN

## 2016-08-01 MED ORDER — POLYETHYLENE GLYCOL 3350 17 G PO PACK
17.0000 g | PACK | Freq: Two times a day (BID) | ORAL | Status: DC | PRN
  Administered 2016-08-02 – 2016-08-08 (×5): 17 g via ORAL
  Filled 2016-08-01 (×7): qty 1

## 2016-08-01 MED ORDER — HYDROCODONE-ACETAMINOPHEN 5-325 MG PO TABS
1.0000 | ORAL_TABLET | Freq: Four times a day (QID) | ORAL | Status: DC | PRN
  Administered 2016-08-01: 1 via ORAL
  Filled 2016-08-01: qty 1

## 2016-08-01 MED ORDER — MORPHINE SULFATE (PF) 2 MG/ML IV SOLN
1.0000 mg | INTRAVENOUS | Status: DC | PRN
  Administered 2016-08-01: 1 mg via INTRAVENOUS
  Filled 2016-08-01: qty 1

## 2016-08-01 MED ORDER — DILTIAZEM HCL-DEXTROSE 100-5 MG/100ML-% IV SOLN (PREMIX)
5.0000 mg/h | INTRAVENOUS | Status: DC
  Administered 2016-08-01 (×2): 15 mg/h via INTRAVENOUS
  Administered 2016-08-02: 5 mg/h via INTRAVENOUS
  Administered 2016-08-02 – 2016-08-03 (×2): 10 mg/h via INTRAVENOUS
  Filled 2016-08-01 (×5): qty 100

## 2016-08-01 MED ORDER — DEXTROSE 5 % IV SOLN
5.0000 mg/h | Freq: Once | INTRAVENOUS | Status: AC
  Administered 2016-08-01: 15 mg/h via INTRAVENOUS
  Filled 2016-08-01: qty 100

## 2016-08-01 MED ORDER — ONDANSETRON HCL 4 MG/2ML IJ SOLN: 4.0000 mg | Freq: Three times a day (TID) | INTRAMUSCULAR | Status: DC | PRN

## 2016-08-01 MED ORDER — METOPROLOL SUCCINATE ER 25 MG PO TB24
25.0000 mg | ORAL_TABLET | Freq: Every day | ORAL | Status: DC
  Administered 2016-08-01: 25 mg via ORAL
  Filled 2016-08-01: qty 1

## 2016-08-01 MED ORDER — HYDROCODONE-ACETAMINOPHEN 5-325 MG PO TABS
1.0000 | ORAL_TABLET | ORAL | Status: DC | PRN
  Administered 2016-08-01 – 2016-08-09 (×18): 1 via ORAL
  Filled 2016-08-01 (×19): qty 1

## 2016-08-01 MED ORDER — LOSARTAN POTASSIUM 50 MG PO TABS
50.0000 mg | ORAL_TABLET | Freq: Every day | ORAL | Status: DC
  Administered 2016-08-01 – 2016-08-08 (×8): 50 mg via ORAL
  Filled 2016-08-01 (×8): qty 1

## 2016-08-01 MED ORDER — METOPROLOL TARTRATE 25 MG PO TABS
25.0000 mg | ORAL_TABLET | Freq: Two times a day (BID) | ORAL | Status: DC
  Administered 2016-08-02: 25 mg via ORAL
  Filled 2016-08-01: qty 1

## 2016-08-01 MED ORDER — FLUTICASONE PROPIONATE 50 MCG/ACT NA SUSP
1.0000 | Freq: Every day | NASAL | Status: DC
  Administered 2016-08-01 – 2016-08-10 (×10): 1 via NASAL
  Filled 2016-08-01 (×2): qty 16

## 2016-08-01 MED ORDER — ALPRAZOLAM 0.25 MG PO TABS
0.2500 mg | ORAL_TABLET | Freq: Two times a day (BID) | ORAL | Status: DC | PRN
  Administered 2016-08-03 – 2016-08-09 (×6): 0.25 mg via ORAL
  Filled 2016-08-01 (×6): qty 1

## 2016-08-01 MED ORDER — OMEGA-3-ACID ETHYL ESTERS 1 G PO CAPS
1.0000 g | ORAL_CAPSULE | Freq: Every day | ORAL | Status: DC
  Administered 2016-08-01 – 2016-08-10 (×10): 1 g via ORAL
  Filled 2016-08-01 (×10): qty 1

## 2016-08-01 MED ORDER — DILTIAZEM HCL 100 MG IV SOLR
5.0000 mg/h | Freq: Once | INTRAVENOUS | Status: AC
  Administered 2016-08-01: 5 mg/h via INTRAVENOUS
  Filled 2016-08-01: qty 100

## 2016-08-01 MED ORDER — SERTRALINE HCL 50 MG PO TABS
50.0000 mg | ORAL_TABLET | Freq: Every day | ORAL | Status: DC
  Administered 2016-08-01 – 2016-08-10 (×10): 50 mg via ORAL
  Filled 2016-08-01 (×10): qty 1

## 2016-08-01 MED ORDER — CYCLOBENZAPRINE HCL 10 MG PO TABS
10.0000 mg | ORAL_TABLET | Freq: Once | ORAL | Status: AC
  Administered 2016-08-01: 10 mg via ORAL
  Filled 2016-08-01: qty 1

## 2016-08-01 MED ORDER — ZOLPIDEM TARTRATE 5 MG PO TABS
5.0000 mg | ORAL_TABLET | Freq: Every evening | ORAL | Status: DC | PRN
  Administered 2016-08-02: 5 mg via ORAL
  Filled 2016-08-01: qty 1

## 2016-08-01 NOTE — ED Notes (Signed)
Oxygen initated @ 3 L/[place]

## 2016-08-01 NOTE — Progress Notes (Signed)
PROGRESS NOTE  Norma Ford [id number] DOB: December 03, 1929 DOA: 07/31/2016 PCP: [facility] Course/Subjective: 82 y.o. female with medical history significant of hypertension, hyperlipidemia, depression, adrenal tumor, Barrett's esophagus, mycosis fungoides lymph nodes, morbid obesity, frequent fall, who presents with fall, right great toe pain, bilateral thigh pain. She has right great toe fracture and was found to be in Rapid Afib which is a new diagnosis for her.   She has been started on PO metoprolol and IV diltiazem drip. Daughter at bedside, patient is comfortable, denies chest pain, dizziness, only complaint is right toe pain.  Assessment/Plan: Atrial fibrillation with rapid ventricular response [facility]): Patient has new onset atrial fibrillation with heart rates up to 135. Denies any chest pain or shortness of breath. Etiology is not clear. Patient may have underlying heart problem, and triggered by severe pain secondary to toe fracture. CHA2DS2-VASc Score is 4, needs oral anticoagulation, but pt is not a good candidate for anticoagulants due to high risk of fall. Will start pt with ASA. - SDU due to the need of cardizem gtt - IV cardizem - restart home metoprolol - ASA 325 mg daily - cycle CE q6 x3 and follow repeat EKG - Nitroglycerin, Morphine, and aspirin, lipitor  - Risk factor stratification: will check FLP, TSH, Free T4. ( pt had A1c 5.2 on 07/04/16). - f/u BNP - 2d echo - cardiology has been consulted  Right great toe fracture and bilateral thigh pain: No hip Fx by X-ray. - Pain control: morphine prn and percocet - When necessary Zofran for nausea - Robaxin for muscle spasm - will consult her orthopedist Dr. Sharol Given  HLD: Last LDL was 111/15/16. Patient was on Zocor, but not taking it currently -Continue home medications  HTN: -Continue Lasix and Cozarr -restart home metoprolol (was on metoprolol, but not taking it currently) -on IV  cardizem  Frequent falls: This is likely due to generalized deconditioning and morbid obesity -pt/ot when HR controlled and okay with ortho  Depression and anxiety: Stable, no suicidal or homicidal ideations. -Continue home medications: zoloft  DVT ppx: SCD Code Status: Full code Family Communication: Yes, patient's daughter at bed side Disposition Plan:  Anticipate discharge back to previous home environment Consults called: Cardiology, Orthopedics Admission status:  SDU/inpation       DVT Prophylaxis: SCDs in case of surgical intervention   Objective: Vitals:   08/01/16 0311 08/01/16 0342 08/01/16 0346 08/01/16 0400  BP: 137/81   (!) 146/84  Pulse: (!) 132   71  Resp: 19   (!) 21  Temp:  97.5 F (36.4 C)  97.5 F (36.4 C)  TempSrc:  Oral  Oral  SpO2: 95%   97%  Weight:   134.3 kg (296 lb 1.2 oz) 134.3 kg (296 lb 1.2 oz)  Height:  5\' 2"  (1.575 m)  5\' 2"  (1.575 m)    Intake/Output Summary (Last 24 hours) at 08/01/16 0745 Last data filed at 08/01/16 0400  Gross per 24 hour  Intake              150 ml  Output                0 ml  Net              150 ml   Filed Weights   07/31/16 2250 08/01/16 0346 08/01/16 0400  Weight: 117.9 kg (260 lb) 134.3 kg (296 lb 1.2 oz) 134.3 kg (296 lb 1.2 oz)  Exam: General:  Alert, oriented, calm, in no acute distress Neck: supple, no masses, trachea mildline  Cardiovascular: irregularly irregular, no murmurs or rubs, no peripheral edema  Respiratory: clear to auscultation bilaterally, no wheezes, no crackles  Abdomen: soft, nontender, nondistended, normal bowel tones heard  Skin: dry, no rashes  Musculoskeletal: no joint effusions, normal range of motion, right great toe swollen, tender Psychiatric: appropriate affect, normal speech  Neurologic: extraocular muscles intact, clear speech, moving all extremities with intact sensorium    Data Reviewed: CBC:  Recent Labs Lab 08/01/16 0021 08/01/16 0418  WBC 6.6 6.9   NEUTROABS 4.9  --   HGB 12.1 12.4  HCT 36.9 38.0  MCV 98.7 98.4  PLT 168 [id number]   Basic Metabolic Panel:  Recent Labs Lab 08/01/16 0021 08/01/16 0418  NA 139 139  K 4.2 4.0  CL 106 106  CO2 26 26  GLUCOSE 108* 99  BUN 25* 24*  CREATININE 0.85 0.78  CALCIUM 8.9 9.0  MG  --  2.1   GFR: Estimated Creatinine Clearance: 69.3 mL/min (by C-G formula based on SCr of 0.78 mg/dL). Liver Function Tests: No results for input(s): AST, ALT, ALKPHOS, BILITOT, PROT, ALBUMIN in the last 168 hours. No results for input(s): LIPASE, AMYLASE in the last 168 hours. No results for input(s): AMMONIA in the last 168 hours. Coagulation Profile:  Recent Labs Lab 08/01/16 0418  INR 1.02   Cardiac Enzymes:  Recent Labs Lab 08/01/16 0021 08/01/16 0418  TROPONINI <0.03 <0.03   BNP (last 3 results) No results for input(s): PROBNP in the last 8760 hours. HbA1C: No results for input(s): HGBA1C in the last 72 hours. CBG:  Recent Labs Lab 08/01/16 0729  GLUCAP 101*   Lipid Profile: No results for input(s): CHOL, HDL, LDLCALC, TRIG, CHOLHDL, LDLDIRECT in the last 72 hours. Thyroid Function Tests:  Recent Labs  08/01/16 0418  TSH 1.812   Anemia Panel: No results for input(s): VITAMINB12, FOLATE, FERRITIN, TIBC, IRON, RETICCTPCT in the last 72 hours. Urine analysis:    Component Value Date/Time   COLORURINE yellow 09/23/2010 0825   APPEARANCEUR Clear 09/23/2010 0825   LABSPEC 1.010 09/23/2010 0825   PHURINE 6.5 09/23/2010 0825   HGBUR negative 09/23/2010 0825   BILIRUBINUR Neg 02/11/2016 1150   PROTEINUR Neg 02/11/2016 1150   UROBILINOGEN 2.0 02/11/2016 1150   UROBILINOGEN negative 09/23/2010 0825   NITRITE Neg 02/11/2016 1150   NITRITE negative 09/23/2010 0825   LEUKOCYTESUR Negative 02/11/2016 1150   Sepsis Labs: @LABRCNTIP (procalcitonin:4,lacticidven:4)  ) Recent Results (from the past 240 hour(s))  MRSA PCR Screening     Status: None   Collection Time: 08/01/16   4:53 AM  Result Value Ref Range Status   MRSA by PCR NEGATIVE NEGATIVE Final    Comment:        The GeneXpert MRSA Assay (FDA approved for NASAL specimens only), is one component of a comprehensive MRSA colonization surveillance program. It is not intended to diagnose MRSA infection nor to guide or monitor treatment for MRSA infections.      Studies: Dg Chest Portable 1 View  Result Date: 08/01/2016 CLINICAL DATA:  Generalized weakness for 2 weeks. Fell at home tonight. Leg pain. History of hypertension, hyperlipidemia. EXAM: PORTABLE CHEST 1 VIEW COMPARISON:  Chest radiograph November 10, 2014 FINDINGS: Cardiac silhouette is mildly enlarged, even with CAD considerations low inspiratory examination. Pulmonary vascular congestion interstitial prominence. Small bilateral pleural effusions. No pneumothorax. Severe degenerative change of the shoulders. IMPRESSION: Mild cardiomegaly.  Interstitial  edema with small pleural effusions. Electronically Signed   By: Elon Alas M.D.   On: 08/01/2016 01:51   Dg Foot Complete Right  Result Date: 08/01/2016 CLINICAL DATA:  Acute onset of right foot pain, especially at the great toe, after fall. Initial encounter. EXAM: RIGHT FOOT COMPLETE - 3+ VIEW COMPARISON:  None. FINDINGS: There is a comminuted fracture involving the first proximal phalanx, with 2 fracture lines extending to the first metatarsophalangeal joint, and mild impaction of a central fragment. No additional fractures are seen. There is no evidence of talar subluxation; the subtalar joint is unremarkable in appearance. A plantar calcaneal spur is noted. Diffuse dorsal soft tissue swelling is noted at the foot, with scattered soft tissue calcifications. IMPRESSION: 1. Comminuted fracture of the first proximal phalanx, with 2 fracture lines extending to the first metatarsophalangeal joint, and mild impaction of a central fragment. 2. Diffuse dorsal soft tissue swelling, with scattered soft  tissue calcifications. Electronically Signed   By: Garald Balding M.D.   On: 08/01/2016 01:01   Dg Femur Min 2 Views Left  Result Date: 08/01/2016 CLINICAL DATA:  82 year old female with bilateral leg pain and fall. EXAM: LEFT FEMUR 2 VIEWS COMPARISON:  Right femur radiograph dated 07/31/2016 FINDINGS: There is no acute fracture or dislocation. The bones are osteopenic. There is a total left knee arthroplasty. There are osteoarthritic changes of the left hip. There is spurring of the superior patella along the insertion of the quadriceps tendon. No significant joint effusion. The soft tissues are grossly unremarkable. IMPRESSION: No acute fracture or dislocation. Electronically Signed   By: Anner Crete M.D.   On: 08/01/2016 00:59   Dg Femur, Min 2 Views Right  Result Date: 08/01/2016 CLINICAL DATA:  Status post fall, with bilateral femur pain. Initial encounter. EXAM: RIGHT FEMUR 2 VIEWS COMPARISON:  None. FINDINGS: There is no evidence of fracture or dislocation. The right femur appears grossly intact. The right hip joint is grossly unremarkable. The patient's right knee arthroplasty is grossly unremarkable in appearance, without evidence of loosening. No definite soft tissue abnormalities are characterized on radiograph. No knee joint effusion is seen. IMPRESSION: No evidence of fracture or dislocation. Right knee arthroplasty is unremarkable in appearance. Electronically Signed   By: Garald Balding M.D.   On: 08/01/2016 01:03    Scheduled Meds: . aspirin EC  325 mg Oral Daily  . fluticasone  1 spray Each Nare Daily  . furosemide  40 mg Oral BID  . losartan  50 mg Oral Daily  . metoprolol succinate  25 mg Oral Daily  . multivitamin with minerals  1 tablet Oral Daily  . omega-3 acid ethyl esters  1 g Oral Daily  . sertraline  50 mg Oral Daily    Continuous Infusions: . diltiazem (CARDIZEM) infusion 15 mg/hr (08/01/16 0734)     LOS: 0 days   Time spent: 21 minutes  Mir Marry Guan, MD [facility] Pager [phone]  If 7PM-7AM, please contact night-coverage [email or web address] Password [facility] 08/01/2016, 7:45 AM

## 2016-08-01 NOTE — Care Management Note (Signed)
Case Management Note  Patient Details  Name: TACARA MAZZARO MRN: [id number] Date of Birth: 20-May-1930  Subjective/Objective:         A. Fib with rvr and IV Cardizem drip           Action/Plan: home   Expected Discharge Date:                  Expected Discharge Plan:  Home/Self Care  In-House Referral:     Discharge planning Services     Post Acute Care Choice:    Choice offered to:     DME Arranged:    DME Agency:     HH Arranged:    HH Agency:     Status of Service:  In process, will continue to follow  If discussed at Long Length of Stay Meetings, dates discussed:    Additional Comments:Date:  August 01, 2016 Chart reviewed for concurrent status and case management needs. Will continue to follow the patient for status change: Discharge Planning: following for needs Expected discharge date: [id number] Velva Harman, BSN, [place], [place]  Leeroy Cha, RN 08/01/2016, 10:43 AM

## 2016-08-01 NOTE — Consult Note (Signed)
Patient ID: Norma Ford MRN: [id number], DOB/AGE: 1930-07-18   Admit date: 07/31/2016   Reason for Consult: Atrial Fibrillation Requesting MD: Dr. Tomma Rakers   Primary Physician: Ann Held, DO Primary Cardiologist: New  Pt. Profile:  82 y/o female with h/o hypertension, hyperlipidemia, depression, adrenal tumor, Barrett's esophagus, who presented to [facility] after sustaining a mechanical fall resulting in right great toe Fx. Found to be in new onset atrial fibrillation w/ RVR.   Problem List  Past Medical History:  Diagnosis Date  . Adrenal tumor 2002   right, followed by Surgery  . Barrett's esophagus 1999  . Cataracts, bilateral   . DECUBITUS ULCER, BUTTOCK 12/21/2009  . History of abnormal mammogram    2001-h/o abnormal mammo-told just calcium deposit-repeat  . HTN (hypertension)   . Hyperlipidemia   . Kidney stone   . MYCOSIS FUNGOIDES LYMPH NODES MULTIPLE SITES 09/21/2009  . THROMBOCYTOPENIA 12/21/2009    Past Surgical History:  Procedure Laterality Date  . ABDOMINAL HYSTERECTOMY    . appendectomy    . ESOPHAGOGASTRODUODENOSCOPY  03/2002  . TONSILLECTOMY    . TOTAL KNEE ARTHROPLASTY     left 2009, right 2003  . tubal ligation       Allergies  Allergies  Allergen Reactions  . Codeine     REACTION: ITCHING    HPI  82 y/o female with h/o hypertension, hyperlipidemia, depression, adrenal tumor, Barrett's esophagus, who presented to [facility] after sustaining a mechanical fall resulting in right great toe Fx. Found to be in new onset atrial fibrillation w/ RVR.   No prior h/o atrial fibrillation. No prior cardiac history.   Hip X-ray negative for Fx. Right foot x-ray shows comminuted fracture of the first proximal phalanx, with 2 fracture lines extending to the first metatarsophalangeal joint, and mild impaction of a central fragment.  Ortho consult pending. Pt considered high risk for recurrent falls. She reports that she has had 3 falls within  the past year. she lives with a family member and ambulates with a walker. Calculated CHA2DS2 VASc score, from what is known from her history, is 4 (HTN, Age >6 and female sex). 2D echo is pending. Troponin negative x 2. TSH WNL however free T4 slightly elevated at 1.35. K and Mg both normal. CBC ok w/o anemia. CXR shows mild cardiomegaly with intestinal edema with small pleural effusion. 2D echo pending.   She denies any cardiac symptoms. No chest pain, dyspnea, palpations, dizziness, syncope/ near syncope, orthopnea or PND. She is still in pain with her toe. She reports last week she felt that she had a UTI but did not seek medical attention. She self nursed herself with cranberry juice. She denies any further dysuria.   She remains in afib. HR in the low 100s. BP is stable.    Home Medications  Prior to Admission medications   Medication Sig Start Date End Date Taking? Authorizing Provider  furosemide (LASIX) 40 MG tablet Take 1 tablet by mouth two  times daily 04/22/16  Yes Yvonne R Lowne Chase, DO  HYDROcodone-acetaminophen (NORCO/VICODIN) 5-325 MG tablet Take 1 tablet by mouth every 6 (six) hours as needed for moderate pain. 04/28/16  Yes Yvonne R Lowne Chase, DO  losartan (COZAAR) 50 MG tablet Take 1 tablet by mouth  daily 06/13/16  Yes Ann Held, DO  Misc. Devices (BARIATRIC ROLLATOR) MISC Four Wheel Education officer, museum with Seat. Dx: 02/26/16  Yes Ann Held, DO  Misc. Devices (  COMMODE BEDSIDE) MISC Bariatric Bedside commode 05/26/15  Yes Yvonne R Lowne Chase, DO  mometasone (NASONEX) 50 MCG/ACT nasal spray Place 2 sprays into the nose daily as needed (for congestion).    Yes Historical Provider, MD  Multiple Vitamin (MULTIVITAMIN) tablet Take 1 tablet by mouth daily.     Yes Historical Provider, MD  NONFORMULARY OR COMPOUNDED ITEM Wheelchair, manual #1  As directed---dx osteoarthritis, frequent falls 02/11/16  Yes Ann Held, DO  NONFORMULARY OR COMPOUNDED ITEM Lift  Chair to use at home as needed 02/26/16  Yes Ann Held, DO  Omega-3 Fatty Acids (FISH OIL PO) Take 1 tablet by mouth 2 (two) times daily.   Yes Historical Provider, MD  polyethylene glycol powder (GLYCOLAX/MIRALAX) powder Take 17 g by mouth 2 (two) times daily as needed. Patient taking differently: Take 17 g by mouth 2 (two) times daily as needed.  07/24/15  Yes Yvonne R Lowne Chase, DO  potassium chloride SA (K-DUR,KLOR-CON) 20 MEQ tablet Take 1 tablet by mouth two  times daily 06/13/16  Yes Alferd Apa Lowne Chase, DO  sertraline (ZOLOFT) 50 MG tablet Take 1 tablet by mouth  daily 04/22/16  Yes Yvonne R Lowne Chase, DO  traMADol (ULTRAM) 50 MG tablet Take 1 tablet (50 mg total) by mouth daily. 06/03/16  Yes Yvonne R Lowne Chase, DO  metoprolol succinate (TOPROL-XL) 25 MG 24 hr tablet Take 1 tablet (25 mg total) by mouth daily. Patient not taking: Reported on 08/01/2016 06/16/15   Rosalita Chessman Chase, DO  simvastatin (ZOCOR) 80 MG tablet Take 1 tablet by mouth at  bedtime Patient not taking: Reported on 08/01/2016 08/03/15   [place]  . aspirin EC  325 mg Oral Daily  . fluticasone  1 spray Each Nare Daily  . furosemide  40 mg Oral BID  . losartan  50 mg Oral Daily  . metoprolol succinate  25 mg Oral Daily  . multivitamin with minerals  1 tablet Oral Daily  . omega-3 acid ethyl esters  1 g Oral Daily  . sertraline  50 mg Oral Daily   Family History  Family History  Problem Relation Age of Onset  . Heart disease Mother   . Heart disease Father     pacemaker  . Kidney disease Father     Social History  Social History   Social History  . Marital status: Married    Spouse name: N/A  . Number of children: N/A  . Years of education: N/A   Occupational History  . Not on file.   Social History Main Topics  . Smoking status: Never Smoker  . Smokeless tobacco: Never Used  . Alcohol use Not on file  . Drug use: Unknown  . Sexual activity: No    Other Topics Concern  . Not on file   Social History Narrative  . No narrative on file     Review of Systems General:  No chills, fever, night sweats or weight changes.  Cardiovascular:  No chest pain, dyspnea on exertion, edema, orthopnea, palpitations, paroxysmal nocturnal dyspnea. Dermatological: No rash, lesions/masses Respiratory: No cough, dyspnea Urologic: No hematuria, dysuria Abdominal:   No nausea, vomiting, diarrhea, bright red blood per rectum, melena, or hematemesis Neurologic:  No visual changes, wkns, changes in mental status. All other systems reviewed and are otherwise negative except as noted above.  Physical Exam  Blood pressure 112/84, pulse (!) 104, temperature 97.5 F (36.4  C), temperature source Oral, resp. rate (!) 21, height 5\' 2"  (1.575 m), weight 296 lb 1.2 oz (134.3 kg), SpO2 98 %.  General: Pleasant, NAD, elderly  Psych: Normal affect. Neuro: Alert and oriented X 3. Moves all extremities spontaneously. HEENT: Normal  Neck: Supple without bruits or JVD. Lungs:  Resp regular and unlabored, CTA. Heart: irregularly irregular, slightly tachy rate no s3, s4, or murmurs. Abdomen: Soft, non-tender, non-distended, BS + x 4.  Extremities: No clubbing. + swell of right foot. DP/PT/Radials 2+ and equal bilaterally.  Labs  Troponin (Point of Care Test) No results for input(s): TROPIPOC in the last 72 hours.  Recent Labs  08/01/16 0021 08/01/16 0418  TROPONINI <0.03 <0.03   Lab Results  Component Value Date   WBC 6.9 08/01/2016   HGB 12.4 08/01/2016   HCT 38.0 08/01/2016   MCV 98.4 08/01/2016   PLT 174 08/01/2016    Recent Labs Lab 08/01/16 0418  NA 139  K 4.0  CL 106  CO2 26  BUN 24*  CREATININE 0.78  CALCIUM 9.0  GLUCOSE 99   Lab Results  Component Value Date   CHOL 162 08/01/2016   HDL 38 (L) 08/01/2016   LDLCALC 110 (H) 08/01/2016   TRIG 69 08/01/2016   Lab Results  Component Value Date   DDIMER 2.26 (H) 10/28/2011      Radiology/Studies  Dg Chest Portable 1 View  Result Date: 08/01/2016 CLINICAL DATA:  Generalized weakness for 2 weeks. Fell at home tonight. Leg pain. History of hypertension, hyperlipidemia. EXAM: PORTABLE CHEST 1 VIEW COMPARISON:  Chest radiograph November 10, 2014 FINDINGS: Cardiac silhouette is mildly enlarged, even with CAD considerations low inspiratory examination. Pulmonary vascular congestion interstitial prominence. Small bilateral pleural effusions. No pneumothorax. Severe degenerative change of the shoulders. IMPRESSION: Mild cardiomegaly.  Interstitial edema with small pleural effusions. Electronically Signed   By: Elon Alas M.D.   On: 08/01/2016 01:51   Dg Foot Complete Right  Result Date: 08/01/2016 CLINICAL DATA:  Acute onset of right foot pain, especially at the great toe, after fall. Initial encounter. EXAM: RIGHT FOOT COMPLETE - 3+ VIEW COMPARISON:  None. FINDINGS: There is a comminuted fracture involving the first proximal phalanx, with 2 fracture lines extending to the first metatarsophalangeal joint, and mild impaction of a central fragment. No additional fractures are seen. There is no evidence of talar subluxation; the subtalar joint is unremarkable in appearance. A plantar calcaneal spur is noted. Diffuse dorsal soft tissue swelling is noted at the foot, with scattered soft tissue calcifications. IMPRESSION: 1. Comminuted fracture of the first proximal phalanx, with 2 fracture lines extending to the first metatarsophalangeal joint, and mild impaction of a central fragment. 2. Diffuse dorsal soft tissue swelling, with scattered soft tissue calcifications. Electronically Signed   By: Garald Balding M.D.   On: 08/01/2016 01:01   Dg Femur Min 2 Views Left  Result Date: 08/01/2016 CLINICAL DATA:  82 year old female with bilateral leg pain and fall. EXAM: LEFT FEMUR 2 VIEWS COMPARISON:  Right femur radiograph dated 07/31/2016 FINDINGS: There is no acute fracture or dislocation.  The bones are osteopenic. There is a total left knee arthroplasty. There are osteoarthritic changes of the left hip. There is spurring of the superior patella along the insertion of the quadriceps tendon. No significant joint effusion. The soft tissues are grossly unremarkable. IMPRESSION: No acute fracture or dislocation. Electronically Signed   By: Anner Crete M.D.   On: 08/01/2016 00:59   Dg Femur, Min 2  Views Right  Result Date: 08/01/2016 CLINICAL DATA:  Status post fall, with bilateral femur pain. Initial encounter. EXAM: RIGHT FEMUR 2 VIEWS COMPARISON:  None. FINDINGS: There is no evidence of fracture or dislocation. The right femur appears grossly intact. The right hip joint is grossly unremarkable. The patient's right knee arthroplasty is grossly unremarkable in appearance, without evidence of loosening. No definite soft tissue abnormalities are characterized on radiograph. No knee joint effusion is seen. IMPRESSION: No evidence of fracture or dislocation. Right knee arthroplasty is unremarkable in appearance. Electronically Signed   By: Garald Balding M.D.   On: 08/01/2016 01:03    ECG  Atrial fibrillation with RVR   Echocardiogram - pending   ASSESSMENT AND PLAN  Principal Problem:   Atrial fibrillation with rapid ventricular response (HCC) Active Problems:   HLD (hyperlipidemia)   Essential hypertension   Severe obesity (BMI >= 40) (HCC)   History of fall   Frequent falls   Thigh pain   Toe fracture, right   Atrial fibrillation with RVR (HCC)   Depression   1. New Onset Atrial Fibrillation: exact onset/ duration unknown. Patient is completely asymptomatic. This is in the setting or pain after recent fall resulting in left great toe fx. There is also concern for underlying UTI as well. Pt had dysuria and pelvic pain last week. Self nursed herself with home remedies and did not seek medical care. Will check a UA today. 2D echo also pending to assess cardiac function,  however doubt ischemia. She denies any h/o CP. Enzymes are negative. We will continue to monitor on telemetry. Continue metoprolol and Cardizem for rate control. Can further increase dose of PO metoprolol if needed. Her calculated  CHA2DS2 VASc score, from what is known from her history, is 4 (HTN, Age >48 and female sex). However pt considered high risk for recurrent falls. She reports that she has had 3 falls within the past year. She lives with a family member and ambulates with a walker. We will need to address oral anticoagulation prior to d/c.  2. Right Toe Fx: ortho consult pending.   Signed, Lyda Jester, PA-C 08/01/2016, 9:38 AM    Attending Note:   The patient was seen and examined.  Agree with assessment and plan as noted above.  Changes made to the above note as needed.  Patient seen and independently examined with Lyda Jester, PA .   We discussed all aspects of the encounter. I agree with the assessment and plan as stated above.  Pt is 67 - found to have new onset atrial fib. Also has a hx of frequent falls . Is asymptomatic   Will increase metoprolol to 25 BID ( changed to the regular release instead of the Toprol XL. Hopefully we can titrate the Dilt drip down and turn off soon.   She has a hx of several falls this year. At this point I would hesitate to prescribe anticoagulation. She can discuss with her primary MD as OP and we can continue to reevaluate this issue    I have spent a total of 40 minutes with patient reviewing hospital  notes , telemetry, EKGs, labs and examining patient as well as establishing an assessment and plan that was discussed with the patient. > 50% of time was spent in direct patient care.    Thayer Headings, Brooke Bonito., MD, [facility] 08/01/2016, 2:37 PM [address],  [place] Pager [phone]

## 2016-08-01 NOTE — Progress Notes (Signed)
PT Cancellation Note  Patient Details Name: Norma Ford MRN: [id number] DOB: 04-28-1930   Cancelled Treatment:    Reason Eval/Treat Not Completed: Medical issues which prohibited therapy. Pt currently on IV cardizem and awaiting ortho consult for toe fx. Will hold PT today and check back another time. Thanks.    Weston Anna, MPT Pager: [phone]

## 2016-08-01 NOTE — Progress Notes (Signed)
PATIENT GIVING PERMISSION FOR FAMILY MEMBERS TO HAVE UPDATES ON HER. PASSWORD IS "[email or web address]"

## 2016-08-01 NOTE — Consult Note (Signed)
ORTHOPAEDIC CONSULTATION  REQUESTING PHYSICIAN: Mir Marry Guan,*  Chief Complaint: Right foot pain status post fall.  HPI: Norma Ford is a 82 y.o. female who presents with fracture proximal phalanx MTP joint right great toe. Patient had a fall due to weakness. Patient recently has been on a prednisone Dosepak which she had completed. Patient states that she felt good from the prednisone Dosepak had decreased back pain decreased hip pain however patient states she did have a episode of weakness causing her fall.  Past Medical History:  Diagnosis Date  . Adrenal tumor 2002   right, followed by Surgery  . Barrett's esophagus 1999  . Cataracts, bilateral   . DECUBITUS ULCER, BUTTOCK 12/21/2009  . History of abnormal mammogram    2001-h/o abnormal mammo-told just calcium deposit-repeat  . HTN (hypertension)   . Hyperlipidemia   . Kidney stone   . MYCOSIS FUNGOIDES LYMPH NODES MULTIPLE SITES 09/21/2009  . THROMBOCYTOPENIA 12/21/2009   Past Surgical History:  Procedure Laterality Date  . ABDOMINAL HYSTERECTOMY    . appendectomy    . ESOPHAGOGASTRODUODENOSCOPY  03/2002  . TONSILLECTOMY    . TOTAL KNEE ARTHROPLASTY     left 2009, right 2003  . tubal ligation     Social History   Social History  . Marital status: Married    Spouse name: N/A  . Number of children: N/A  . Years of education: N/A   Social History Main Topics  . Smoking status: Never Smoker  . Smokeless tobacco: Never Used  . Alcohol use None  . Drug use: Unknown  . Sexual activity: No   Other Topics Concern  . None   Social History Narrative  . None   Family History  Problem Relation Age of Onset  . Heart disease Mother   . Heart disease Father     pacemaker  . Kidney disease Father    - negative except otherwise stated in the family history section Allergies  Allergen Reactions  . Codeine     REACTION: ITCHING   Prior to Admission medications   Medication Sig Start Date End  Date Taking? Authorizing Provider  furosemide (LASIX) 40 MG tablet Take 1 tablet by mouth two  times daily 04/22/16  Yes Yvonne R Lowne Chase, DO  HYDROcodone-acetaminophen (NORCO/VICODIN) 5-325 MG tablet Take 1 tablet by mouth every 6 (six) hours as needed for moderate pain. 04/28/16  Yes Yvonne R Lowne Chase, DO  losartan (COZAAR) 50 MG tablet Take 1 tablet by mouth  daily 06/13/16  Yes Ann Held, DO  Misc. Devices (BARIATRIC ROLLATOR) MISC Four Wheel Education officer, museum with Seat. Dx: 02/26/16  Yes Ann Held, DO  Misc. Devices (COMMODE BEDSIDE) MISC Bariatric Bedside commode 05/26/15  Yes Yvonne R Lowne Chase, DO  mometasone (NASONEX) 50 MCG/ACT nasal spray Place 2 sprays into the nose daily as needed (for congestion).    Yes Historical Provider, MD  Multiple Vitamin (MULTIVITAMIN) tablet Take 1 tablet by mouth daily.     Yes Historical Provider, MD  NONFORMULARY OR COMPOUNDED ITEM Wheelchair, manual #1  As directed---dx osteoarthritis, frequent falls 02/11/16  Yes Ann Held, DO  NONFORMULARY OR COMPOUNDED ITEM Lift Chair to use at home as needed 02/26/16  Yes Ann Held, DO  Omega-3 Fatty Acids (FISH OIL PO) Take 1 tablet by mouth 2 (two) times daily.   Yes Historical Provider, MD  polyethylene glycol powder (GLYCOLAX/MIRALAX) powder Take 17 g by mouth 2 (  two) times daily as needed. Patient taking differently: Take 17 g by mouth 2 (two) times daily as needed.  07/24/15  Yes Yvonne R Lowne Chase, DO  potassium chloride SA (K-DUR,KLOR-CON) 20 MEQ tablet Take 1 tablet by mouth two  times daily 06/13/16  Yes Alferd Apa Lowne Chase, DO  sertraline (ZOLOFT) 50 MG tablet Take 1 tablet by mouth  daily 04/22/16  Yes Yvonne R Lowne Chase, DO  traMADol (ULTRAM) 50 MG tablet Take 1 tablet (50 mg total) by mouth daily. 06/03/16  Yes Yvonne R Lowne Chase, DO  metoprolol succinate (TOPROL-XL) 25 MG 24 hr tablet Take 1 tablet (25 mg total) by mouth daily. Patient not taking: Reported on  08/01/2016 06/16/15   Rosalita Chessman Chase, DO  simvastatin (ZOCOR) 80 MG tablet Take 1 tablet by mouth at  bedtime Patient not taking: Reported on 08/01/2016 08/03/15   Ann Held, DO   Dg Chest Portable 1 View  Result Date: 08/01/2016 CLINICAL DATA:  Generalized weakness for 2 weeks. Fell at home tonight. Leg pain. History of hypertension, hyperlipidemia. EXAM: PORTABLE CHEST 1 VIEW COMPARISON:  Chest radiograph November 10, 2014 FINDINGS: Cardiac silhouette is mildly enlarged, even with CAD considerations low inspiratory examination. Pulmonary vascular congestion interstitial prominence. Small bilateral pleural effusions. No pneumothorax. Severe degenerative change of the shoulders. IMPRESSION: Mild cardiomegaly.  Interstitial edema with small pleural effusions. Electronically Signed   By: Elon Alas M.D.   On: 08/01/2016 01:51   Dg Foot Complete Right  Result Date: 08/01/2016 CLINICAL DATA:  Acute onset of right foot pain, especially at the great toe, after fall. Initial encounter. EXAM: RIGHT FOOT COMPLETE - 3+ VIEW COMPARISON:  None. FINDINGS: There is a comminuted fracture involving the first proximal phalanx, with 2 fracture lines extending to the first metatarsophalangeal joint, and mild impaction of a central fragment. No additional fractures are seen. There is no evidence of talar subluxation; the subtalar joint is unremarkable in appearance. A plantar calcaneal spur is noted. Diffuse dorsal soft tissue swelling is noted at the foot, with scattered soft tissue calcifications. IMPRESSION: 1. Comminuted fracture of the first proximal phalanx, with 2 fracture lines extending to the first metatarsophalangeal joint, and mild impaction of a central fragment. 2. Diffuse dorsal soft tissue swelling, with scattered soft tissue calcifications. Electronically Signed   By: Garald Balding M.D.   On: 08/01/2016 01:01   Dg Femur Min 2 Views Left  Result Date: 08/01/2016 CLINICAL DATA:   82 year old female with bilateral leg pain and fall. EXAM: LEFT FEMUR 2 VIEWS COMPARISON:  Right femur radiograph dated 07/31/2016 FINDINGS: There is no acute fracture or dislocation. The bones are osteopenic. There is a total left knee arthroplasty. There are osteoarthritic changes of the left hip. There is spurring of the superior patella along the insertion of the quadriceps tendon. No significant joint effusion. The soft tissues are grossly unremarkable. IMPRESSION: No acute fracture or dislocation. Electronically Signed   By: Anner Crete M.D.   On: 08/01/2016 00:59   Dg Femur, Min 2 Views Right  Result Date: 08/01/2016 CLINICAL DATA:  Status post fall, with bilateral femur pain. Initial encounter. EXAM: RIGHT FEMUR 2 VIEWS COMPARISON:  None. FINDINGS: There is no evidence of fracture or dislocation. The right femur appears grossly intact. The right hip joint is grossly unremarkable. The patient's right knee arthroplasty is grossly unremarkable in appearance, without evidence of loosening. No definite soft tissue abnormalities are characterized on radiograph. No knee joint effusion is seen.  IMPRESSION: No evidence of fracture or dislocation. Right knee arthroplasty is unremarkable in appearance. Electronically Signed   By: Garald Balding M.D.   On: 08/01/2016 01:03   - pertinent xrays, CT, MRI studies were reviewed and independently interpreted  Positive ROS: All other systems have been reviewed and were otherwise negative with the exception of those mentioned in the HPI and as above.  Physical Exam: General: Alert, no acute distress Psychiatric: Patient is competent for consent with normal mood and affect Lymphatic: No axillary or cervical lymphadenopathy Cardiovascular: No pedal edema Respiratory: No cyanosis, no use of accessory musculature GI: No organomegaly, abdomen is soft and non-tender  Skin: Patient has some ecchymosis and bruising of the right great toe.   Neurologic: Patient  does have protective sensation bilateral lower extremities.   MUSCULOSKELETAL:  On examination patient has a good dorsalis pedis pulse she has venous stasis swelling in the foot and leg. She has no ulcers no open wounds. Review the radiographs shows a intra-articular fracture of the MTP joint with fracture of the proximal phalanx base. Patient has no malalignment of the toe. Radiographs of the hip and knee shows no evidence of a fracture.  Assessment: Assessment: Intra-articular fracture base of the first metatarsal MTP joint right great toe.  Plan: Plan: Patient may be weightbearing as tolerated in a postoperative shoe this was ordered. Anticipate patient most likely will need discharge to rehabilitation. She is deconditioned.  Thank you for the consult and the opportunity to see Ms. Steve Rattler, MD [facility] [phone] 5:58 PM

## 2016-08-01 NOTE — ED Notes (Signed)
Attempted x 1 without success to initiate 2nd IV site.

## 2016-08-01 NOTE — Progress Notes (Signed)
*  PRELIMINARY RESULTS* Echocardiogram 2D Echocardiogram has been performed.  Leavy Cella 08/01/2016, 12:05 PM

## 2016-08-01 NOTE — H&P (Signed)
History and Physical    Norma Ford [id number] DOB: 1930-08-06 DOA: 07/31/2016  Referring MD/NP/PA:   PCP: Ann Held, DO   Patient coming from:  The patient is coming from home.  At baseline, pt is partially dependent for most of ADL.       Chief Complaint: fall, right great toe pain, bilateral thigh pain  HPI: Norma Ford is a 82 y.o. female with medical history significant of hypertension, hyperlipidemia, depression, adrenal tumor, Barrett's esophagus, mycosis fungoides lymph nodes, morbid obesity, frequent fall, who presents with fall, right great toe pain, bilateral thigh pain.   Patient states that she fell when she was walking to the bathroom using a walker and tripped her steps at about 3:30 last night. She landed on her feet and injured her right great toe, causing severe pain. No LOC, strongly denies head or neck injury. No HA or neck pain. Her right great toe pain is constant, 10 out of 10 severity, nonradiating, aggravated by standing. Patient states that she had bilateral groin pain recently. She was seen by her ortho surgeon, Dr. Sharol Given. She was treated with 11 days of prednisone with significant improvement. She states that after the fall last night, she has developed bilateral thigh pain and worsening groin pain. The pain is constant, severe, nonradiating. It is aggravated by standing up. Patient denies any prodromal symptoms before fall, no dizziness, unilateral weakness, vision change, hearing loss, chest pain, shortness of breath. Patient denies fever, chills, nausea, vomiting, diarrhea, symptoms of a UTI. She states that she is taking lasix for leg edema, denies hx of CHF.  ED Course: pt was found to have new onset A fib with RVR, WBC 6.6, negative troponin, electrolytes and renal function okay, temperature normal. Chest x-ray showed interstitial edema and small amount of pleural effusion. X-ray of bilateral femur negative for fracture. X-ray of right foot  showed comminuted fracture of the first proximal phalanx, with 2 fracture lines extending to the first metatarsophalangeal joint, and mild impaction of a central fragment. Pt is admitted to stepdown bed as inpatient.  Review of Systems:   General: no fevers, chills, no changes in body weight, has fatigue HEENT: no blurry vision, hearing changes or sore throat Respiratory: no dyspnea, coughing, wheezing CV: no chest pain, no palpitations GI: no nausea, vomiting, abdominal pain, diarrhea, constipation GU: no dysuria, burning on urination, increased urinary frequency, hematuria  Ext: has leg edema Neuro: no unilateral weakness, numbness, or tingling, no vision change or hearing loss Skin: no skin tear. MSK: has pain over bilateral thighs and right great toe. Heme: No easy bruising.  Travel history: No recent long distant travel.  Allergy:  Allergies  Allergen Reactions  . Codeine     REACTION: ITCHING    Past Medical History:  Diagnosis Date  . Adrenal tumor 2002   right, followed by Surgery  . Barrett's esophagus 1999  . Cataracts, bilateral   . DECUBITUS ULCER, BUTTOCK 12/21/2009  . History of abnormal mammogram    2001-h/o abnormal mammo-told just calcium deposit-repeat  . HTN (hypertension)   . Hyperlipidemia   . Kidney stone   . MYCOSIS FUNGOIDES LYMPH NODES MULTIPLE SITES 09/21/2009  . THROMBOCYTOPENIA 12/21/2009    Past Surgical History:  Procedure Laterality Date  . ABDOMINAL HYSTERECTOMY    . appendectomy    . ESOPHAGOGASTRODUODENOSCOPY  03/2002  . TONSILLECTOMY    . TOTAL KNEE ARTHROPLASTY     left 2009, right 2003  . tubal ligation  Social History:  reports that she has never smoked. She has never used smokeless tobacco. Her alcohol and drug histories are not on file.  Family History:  Family History  Problem Relation Age of Onset  . Heart disease Mother   . Heart disease Father     pacemaker  . Kidney disease Father      Prior to Admission  medications   Medication Sig Start Date End Date Taking? Authorizing Provider  furosemide (LASIX) 40 MG tablet Take 1 tablet by mouth two  times daily 04/22/16  Yes Yvonne R Lowne Chase, DO  HYDROcodone-acetaminophen (NORCO/VICODIN) 5-325 MG tablet Take 1 tablet by mouth every 6 (six) hours as needed for moderate pain. 04/28/16  Yes Yvonne R Lowne Chase, DO  losartan (COZAAR) 50 MG tablet Take 1 tablet by mouth  daily 06/13/16  Yes Ann Held, DO  Misc. Devices (BARIATRIC ROLLATOR) MISC Four Wheel Education officer, museum with Seat. Dx: 02/26/16  Yes Ann Held, DO  Misc. Devices (COMMODE BEDSIDE) MISC Bariatric Bedside commode 05/26/15  Yes Yvonne R Lowne Chase, DO  mometasone (NASONEX) 50 MCG/ACT nasal spray Place 2 sprays into the nose daily as needed (for congestion).    Yes Historical Provider, MD  Multiple Vitamin (MULTIVITAMIN) tablet Take 1 tablet by mouth daily.     Yes Historical Provider, MD  NONFORMULARY OR COMPOUNDED ITEM Wheelchair, manual #1  As directed---dx osteoarthritis, frequent falls 02/11/16  Yes Ann Held, DO  NONFORMULARY OR COMPOUNDED ITEM Lift Chair to use at home as needed 02/26/16  Yes Ann Held, DO  Omega-3 Fatty Acids (FISH OIL PO) Take 1 tablet by mouth 2 (two) times daily.   Yes Historical Provider, MD  polyethylene glycol powder (GLYCOLAX/MIRALAX) powder Take 17 g by mouth 2 (two) times daily as needed. Patient taking differently: Take 17 g by mouth 2 (two) times daily as needed.  07/24/15  Yes Yvonne R Lowne Chase, DO  potassium chloride SA (K-DUR,KLOR-CON) 20 MEQ tablet Take 1 tablet by mouth two  times daily 06/13/16  Yes Alferd Apa Lowne Chase, DO  sertraline (ZOLOFT) 50 MG tablet Take 1 tablet by mouth  daily 04/22/16  Yes Yvonne R Lowne Chase, DO  traMADol (ULTRAM) 50 MG tablet Take 1 tablet (50 mg total) by mouth daily. 06/03/16  Yes Yvonne R Lowne Chase, DO  metoprolol succinate (TOPROL-XL) 25 MG 24 hr tablet Take 1 tablet (25 mg total) by  mouth daily. Patient not taking: Reported on 08/01/2016 06/16/15   Rosalita Chessman Chase, DO  simvastatin (ZOCOR) 80 MG tablet Take 1 tablet by mouth at  bedtime Patient not taking: Reported on 08/01/2016 08/03/15   Ann Held, DO    Physical Exam: Vitals:   08/01/16 0200 08/01/16 0215 08/01/16 0230 08/01/16 0245  BP: (!) 136/101 127/77 (!) 138/102 135/77  Pulse: (!) 124 (!) 124 (!) 125   Resp: 17 23 19 18   Temp:      TempSrc:      SpO2: 97% 95% 96%   Weight:      Height:       General: Not in acute distress HEENT:       Eyes: PERRL, EOMI, no scleral icterus.       ENT: No discharge from the ears and nose, no pharynx injection, no tonsillar enlargement.        Neck: No JVD, no bruit, no mass felt. Heme: No neck lymph node enlargement. Cardiac: S1/S2, Irregularly  irregular rhythm, No murmurs, No gallops or rubs. Respiratory: No rales, wheezing, rhonchi or rubs. GI: Soft, nondistended, nontender, no rebound pain, no organomegaly, BS present. GU: No hematuria Ext: 1+ pitting leg edema bilaterally. 2+DP/PT pulse bilaterally. Musculoskeletal: has tenderness over bilateral thighs and right great toe. Skin: No skin tear Neuro: Alert, oriented X3, cranial nerves II-XII grossly intact, moves all extremities normally. Psych: Patient is not psychotic, no suicidal or hemocidal ideation.  Labs on Admission: I have personally reviewed following labs and imaging studies  CBC:  Recent Labs Lab 08/01/16 0021  WBC 6.6  NEUTROABS 4.9  HGB 12.1  HCT 36.9  MCV 98.7  PLT [id number]   Basic Metabolic Panel:  Recent Labs Lab 08/01/16 0021  NA 139  K 4.2  CL 106  CO2 26  GLUCOSE 108*  BUN 25*  CREATININE 0.85  CALCIUM 8.9   GFR: Estimated Creatinine Clearance: 62.2 mL/min (by C-G formula based on SCr of 0.85 mg/dL). Liver Function Tests: No results for input(s): AST, ALT, ALKPHOS, BILITOT, PROT, ALBUMIN in the last 168 hours. No results for input(s): LIPASE, AMYLASE in the last  168 hours. No results for input(s): AMMONIA in the last 168 hours. Coagulation Profile: No results for input(s): INR, PROTIME in the last 168 hours. Cardiac Enzymes:  Recent Labs Lab 08/01/16 0021  TROPONINI <0.03   BNP (last 3 results) No results for input(s): PROBNP in the last 8760 hours. HbA1C: No results for input(s): HGBA1C in the last 72 hours. CBG: No results for input(s): GLUCAP in the last 168 hours. Lipid Profile: No results for input(s): CHOL, HDL, LDLCALC, TRIG, CHOLHDL, LDLDIRECT in the last 72 hours. Thyroid Function Tests: No results for input(s): TSH, T4TOTAL, FREET4, T3FREE, THYROIDAB in the last 72 hours. Anemia Panel: No results for input(s): VITAMINB12, FOLATE, FERRITIN, TIBC, IRON, RETICCTPCT in the last 72 hours. Urine analysis:    Component Value Date/Time   COLORURINE yellow 09/23/2010 0825   APPEARANCEUR Clear 09/23/2010 0825   LABSPEC 1.010 09/23/2010 0825   PHURINE 6.5 09/23/2010 0825   HGBUR negative 09/23/2010 0825   BILIRUBINUR Neg 02/11/2016 1150   PROTEINUR Neg 02/11/2016 1150   UROBILINOGEN 2.0 02/11/2016 1150   UROBILINOGEN negative 09/23/2010 0825   NITRITE Neg 02/11/2016 1150   NITRITE negative 09/23/2010 0825   LEUKOCYTESUR Negative 02/11/2016 1150   Sepsis Labs: @LABRCNTIP (procalcitonin:4,lacticidven:4) )No results found for this or any previous visit (from the past 240 hour(s)).   Radiological Exams on Admission: Dg Chest Portable 1 View  Result Date: 08/01/2016 CLINICAL DATA:  Generalized weakness for 2 weeks. Fell at home tonight. Leg pain. History of hypertension, hyperlipidemia. EXAM: PORTABLE CHEST 1 VIEW COMPARISON:  Chest radiograph November 10, 2014 FINDINGS: Cardiac silhouette is mildly enlarged, even with CAD considerations low inspiratory examination. Pulmonary vascular congestion interstitial prominence. Small bilateral pleural effusions. No pneumothorax. Severe degenerative change of the shoulders. IMPRESSION: Mild  cardiomegaly.  Interstitial edema with small pleural effusions. Electronically Signed   By: Elon Alas M.D.   On: 08/01/2016 01:51   Dg Foot Complete Right  Result Date: 08/01/2016 CLINICAL DATA:  Acute onset of right foot pain, especially at the great toe, after fall. Initial encounter. EXAM: RIGHT FOOT COMPLETE - 3+ VIEW COMPARISON:  None. FINDINGS: There is a comminuted fracture involving the first proximal phalanx, with 2 fracture lines extending to the first metatarsophalangeal joint, and mild impaction of a central fragment. No additional fractures are seen. There is no evidence of talar subluxation; the subtalar joint is  unremarkable in appearance. A plantar calcaneal spur is noted. Diffuse dorsal soft tissue swelling is noted at the foot, with scattered soft tissue calcifications. IMPRESSION: 1. Comminuted fracture of the first proximal phalanx, with 2 fracture lines extending to the first metatarsophalangeal joint, and mild impaction of a central fragment. 2. Diffuse dorsal soft tissue swelling, with scattered soft tissue calcifications. Electronically Signed   By: Garald Balding M.D.   On: 08/01/2016 01:01   Dg Femur Min 2 Views Left  Result Date: 08/01/2016 CLINICAL DATA:  82 year old female with bilateral leg pain and fall. EXAM: LEFT FEMUR 2 VIEWS COMPARISON:  Right femur radiograph dated 07/31/2016 FINDINGS: There is no acute fracture or dislocation. The bones are osteopenic. There is a total left knee arthroplasty. There are osteoarthritic changes of the left hip. There is spurring of the superior patella along the insertion of the quadriceps tendon. No significant joint effusion. The soft tissues are grossly unremarkable. IMPRESSION: No acute fracture or dislocation. Electronically Signed   By: Anner Crete M.D.   On: 08/01/2016 00:59   Dg Femur, Min 2 Views Right  Result Date: 08/01/2016 CLINICAL DATA:  Status post fall, with bilateral femur pain. Initial encounter. EXAM:  RIGHT FEMUR 2 VIEWS COMPARISON:  None. FINDINGS: There is no evidence of fracture or dislocation. The right femur appears grossly intact. The right hip joint is grossly unremarkable. The patient's right knee arthroplasty is grossly unremarkable in appearance, without evidence of loosening. No definite soft tissue abnormalities are characterized on radiograph. No knee joint effusion is seen. IMPRESSION: No evidence of fracture or dislocation. Right knee arthroplasty is unremarkable in appearance. Electronically Signed   By: Garald Balding M.D.   On: 08/01/2016 01:03     EKG: Independently reviewed.  QTC 466, atrial fibrillation with RVR, LAD, poor R-wave progression  Assessment/Plan Principal Problem:   Atrial fibrillation with rapid ventricular response (HCC) Active Problems:   HLD (hyperlipidemia)   Essential hypertension   Severe obesity (BMI >= 40) (HCC)   History of fall   Frequent falls   Thigh pain   Toe fracture, right   Atrial fibrillation with RVR ([place])   Depression   Atrial fibrillation with rapid ventricular response [facility]): Patient has new onset atrial fibrillation with heart rates up to 135. Denies any chest pain or shortness of breath. Etiology is not clear. Patient may have underlying heart problem, and triggered by severe pain secondary to toe fracture. CHA2DS2-VASc Score is 4, needs oral anticoagulation, but pt is not a good candidate for anticoagulants due to high risk of fall. Will start pt with ASA.  - will admit to SDU due to the need of cardizem gtt - IV cardizem - restart home metoprolol (was on metoprolol, but not taking it currently) - ASA 325 mg daily - cycle CE q6 x3 and repeat her EKG in the am  - Nitroglycerin, Morphine, and aspirin, lipitor  - Risk factor stratification: will check FLP, TSH, Free T4. ( pt had A1c 5.2 on 07/04/16). - check BNP - 2d echo - please call Card in AM  Right great toe fracture and bilateral thigh pain: No hip Fx by X-ray. - Pain  control: morphine prn and percocet - When necessary Zofran for nausea - Robaxin for muscle spasm - Please call ortho in AM (her ortho is Dr. Lucious Groves)  HLD: Last LDL was 111/15/16. Patient was on Zocor, but not taking it currently -Continue home medications  HTN: -Continue Lasix and Cozarr -restart home metoprolol (was on  metoprolol, but not taking it currently) -on IV cardizem  Frequent falls: This is likely due to generalized deconditioning and morbid obesity -pt/ot  Depression and anxiety: Stable, no suicidal or homicidal ideations. -Continue home medications: zoloft   DVT ppx: SCD Code Status: Full code Family Communication: Yes, patient's daughter at bed side Disposition Plan:  Anticipate discharge back to previous home environment Consults called: none  Admission status:  SDU/inpation       Date of Service 08/01/2016    Ivor Costa [facility] Pager [phone]  If 7PM-7AM, please contact night-coverage [email or web address] Password [email or web address] 08/01/2016, 2:57 AM

## 2016-08-02 ENCOUNTER — Encounter (HOSPITAL_COMMUNITY): Payer: Self-pay | Admitting: *Deleted

## 2016-08-02 LAB — BASIC METABOLIC PANEL
Anion gap: 6 (ref 5–15)
BUN: 15 mg/dL (ref 6–20)
CO2: 29 mmol/L (ref 22–32)
CREATININE: 0.57 mg/dL (ref 0.44–1.00)
Calcium: 8.1 mg/dL — ABNORMAL LOW (ref 8.9–10.3)
Chloride: 102 mmol/L (ref 101–111)
Glucose, Bld: 98 mg/dL (ref 65–99)
POTASSIUM: 3.3 mmol/L — AB (ref 3.5–5.1)
SODIUM: 137 mmol/L (ref 135–145)

## 2016-08-02 LAB — URINALYSIS, ROUTINE W REFLEX MICROSCOPIC
BILIRUBIN URINE: NEGATIVE
GLUCOSE, UA: NEGATIVE mg/dL
HGB URINE DIPSTICK: NEGATIVE
Ketones, ur: NEGATIVE mg/dL
NITRITE: POSITIVE — AB
PH: 7.5 (ref 5.0–8.0)
Protein, ur: NEGATIVE mg/dL
SPECIFIC GRAVITY, URINE: 1.023 (ref 1.005–1.030)

## 2016-08-02 LAB — CBC
HEMATOCRIT: 32.6 % — AB (ref 36.0–46.0)
Hemoglobin: 10.8 g/dL — ABNORMAL LOW (ref 12.0–15.0)
MCH: 32.6 pg (ref 26.0–34.0)
MCHC: 33.1 g/dL (ref 30.0–36.0)
MCV: 98.5 fL (ref 78.0–100.0)
PLATELETS: 176 10*3/uL (ref 150–400)
RBC: 3.31 MIL/uL — ABNORMAL LOW (ref 3.87–5.11)
RDW: 14.4 % (ref 11.5–15.5)
WBC: 6 10*3/uL (ref 4.0–10.5)

## 2016-08-02 LAB — GLUCOSE, CAPILLARY: GLUCOSE-CAPILLARY: 75 mg/dL (ref 65–99)

## 2016-08-02 LAB — URINE MICROSCOPIC-ADD ON

## 2016-08-02 MED ORDER — ALBUTEROL SULFATE (2.5 MG/3ML) 0.083% IN NEBU
5.0000 mg | INHALATION_SOLUTION | Freq: Once | RESPIRATORY_TRACT | Status: AC
Start: 2016-08-02 — End: 2016-08-02
  Administered 2016-08-02: 5 mg via RESPIRATORY_TRACT
  Filled 2016-08-02: qty 6

## 2016-08-02 MED ORDER — POTASSIUM CHLORIDE CRYS ER 20 MEQ PO TBCR
40.0000 meq | EXTENDED_RELEASE_TABLET | Freq: Once | ORAL | Status: DC
  Filled 2016-08-02: qty 2

## 2016-08-02 MED ORDER — POTASSIUM CHLORIDE 20 MEQ/15ML (10%) PO SOLN
40.0000 meq | Freq: Once | ORAL | Status: AC
  Administered 2016-08-02: 40 meq via ORAL
  Filled 2016-08-02: qty 30

## 2016-08-02 MED ORDER — METOPROLOL TARTRATE 25 MG PO TABS
25.0000 mg | ORAL_TABLET | Freq: Once | ORAL | Status: AC
  Administered 2016-08-02: 25 mg via ORAL
  Filled 2016-08-02: qty 1

## 2016-08-02 MED ORDER — METOPROLOL TARTRATE 50 MG PO TABS
50.0000 mg | ORAL_TABLET | Freq: Two times a day (BID) | ORAL | Status: DC
  Administered 2016-08-02 – 2016-08-04 (×5): 50 mg via ORAL
  Filled 2016-08-02: qty 1
  Filled 2016-08-02 (×2): qty 2
  Filled 2016-08-02 (×2): qty 1

## 2016-08-02 NOTE — Progress Notes (Signed)
OT Cancellation Note  Patient Details Name: Norma Ford MRN: [id number] DOB: 1930-08-25   Cancelled Treatment:    Reason Eval/Treat Not Completed: Fatigue/lethargy limiting ability to participate  Pt ask that OT return next day.  Kari Baars, [place]  Payton Mccallum D 08/02/2016, 2:12 PM

## 2016-08-02 NOTE — Evaluation (Addendum)
Physical Therapy Evaluation Patient Details Name: Norma Ford MRN: [id number] DOB: [birth year] Today's Date: 08/02/2016   History of Present Illness  82 y/o female with h/o hypertension, hyperlipidemia, depression, adrenal tumor, Barrett's esophagus, who presented to [facility] after sustaining a mechanical fall resulting in right great toe Fx. Found to be in new onset atrial fibrillation w/ RVR.   Clinical Impression  Pt admitted with above diagnosis. Pt currently with functional limitations due to the deficits listed below (see PT Problem List).  Pt will benefit from skilled PT to increase their independence and safety with mobility to allow discharge to the venue listed below.    Pt cooperative today with therapy, motivated to be OOB; will continue to follow, hopefully pt will be able to return home with HHPT and her caregiver  VSS during PT today    Follow Up Recommendations Home health PT;Supervision/Assistance - 24 hour (if continues to progres will be able to go home with HHPT)    Equipment Recommendations  None recommended by PT    Recommendations for Other Services       Precautions / Restrictions Precautions Precautions: Fall Required Braces or Orthoses: Other Brace/Splint Other Brace/Splint: post op shoe on R Restrictions Weight Bearing Restrictions: No Other Position/Activity Restrictions: WBAT RLE      Mobility  Bed Mobility Overal bed mobility: Needs Assistance;+2 for physical assistance;+ 2 for safety/equipment Bed Mobility: Supine to Sit     Supine to sit: Min assist;+2 for physical assistance     General bed mobility comments: cues to self assist  Transfers Overall transfer level: Needs assistance Equipment used: Rolling walker (2 wheeled) Transfers: Sit to/from [facility] Sit to Stand: Mod assist;+2 physical assistance Stand pivot transfers: Mod assist;+2 physical assistance       General transfer comment: cues for wt shift, foot  placement and hand placement  Ambulation/Gait                Stairs            Wheelchair Mobility    Modified Rankin (Stroke Patients Only)       Balance Overall balance assessment: Needs assistance Sitting-balance support: No upper extremity supported;Feet supported Sitting balance-Leahy Scale: Fair       Standing balance-Leahy Scale: Poor Standing balance comment: reliant on UEs                             Pertinent Vitals/Pain Pain Assessment: Faces Faces Pain Scale: Hurts a little bit Pain Location: R knee Pain Descriptors / Indicators: Sore Pain Intervention(s): Monitored during session    Home Living Family/patient expects to be discharged to:: Private residence Living Arrangements: Other relatives Available Help at Discharge: Available 24 hours/day Type of Home: House Home Access: Level entry     Home Layout: One level Home Equipment: Environmental consultant - 2 wheels;Walker - 4 wheels;Wheelchair - power;Hospital bed      Prior Function Level of Independence: Needs assistance   Gait / Transfers Assistance Needed: amb with walker household distances; refuses to use her power chair per her caregiver  ADL's / Homemaking Assistance Needed: assistance form caregiver        Hand Dominance        Extremity/Trunk Assessment   Upper Extremity Assessment: Defer to OT evaluation;Generalized weakness           Lower Extremity Assessment: Generalized weakness         Communication  Cognition Arousal/Alertness: Awake/alert Behavior During Therapy: WFL for tasks assessed/performed Overall Cognitive Status: Within Functional Limits for tasks assessed                      General Comments      Exercises     Assessment/Plan    PT Assessment Patient needs continued PT services  PT Problem List Decreased strength;Decreased range of motion;Decreased activity tolerance;Decreased mobility;Decreased balance          PT  Treatment Interventions DME instruction;Gait training;Functional mobility training;Therapeutic exercise;Therapeutic activities;Patient/family education    PT Goals (Current goals can be found in the Care Plan section)  Acute Rehab PT Goals Patient Stated Goal: get better PT Goal Formulation: With patient Time For Goal Achievement: 08/16/16 Potential to Achieve Goals: Good    Frequency Min 3X/week   Barriers to discharge        Co-evaluation               End of Session Equipment Utilized During Treatment: Gait belt Activity Tolerance: Patient tolerated treatment well Patient left: with call bell/phone within reach;in chair;with chair alarm set;with family/visitor present           Time: [id number] PT Time Calculation (min) (ACUTE ONLY): 22 min   Charges:   PT Evaluation $PT Eval Moderate Complexity: 1 Procedure     PT G Codes:        Khrystyna Schwalm 2016/08/16, 2:13 PM

## 2016-08-02 NOTE — Progress Notes (Signed)
PROGRESS NOTE  Norma Ford [id number] DOB: [birth year] DOA: 07/31/2016 PCP: [facility] Course/Subjective: 82 y.o. female with medical history significant of hypertension, hyperlipidemia, depression, adrenal tumor, Barrett's esophagus, mycosis fungoides lymph nodes, morbid obesity, frequent fall, who presents with fall, right great toe pain, bilateral thigh pain. She has right great toe fracture and was found to be in Rapid Afib which is a new diagnosis for her.   She has been started on BID PO metoprolol by cardiology and still on IV diltiazem drip at 5mg /hr. Family at bedside, patient is comfortable, denies chest pain, dizziness. She developed a sensation of swollen tongue last night, but says it is stable, denies trouble swallowing, shortness of breath, pain/throat pain or stridor.  Assessment/Plan: Atrial fibrillation with rapid ventricular response [facility]): Patient has new onset atrial fibrillation with heart rates up to 135.  This morning rates are only slightly better, peaking in the 130s with movement. Denies any chest pain or shortness of breath. Etiology is not clear.  - SDU due to the need of cardizem gtt, cardiology to titrate/switch to oral - cont metoprolol BID - ASA 325 mg daily - Nitroglycerin, Morphine, and aspirin, lipitor  - 2d echo unremarkable - agree with holding full anticoagulation for now given history of falls - appreciate cardiology management  Right great toe fracture and bilateral thigh pain: No hip Fx by X-ray. - Pain control: morphine prn and percocet - When necessary Zofran for nausea - Robaxin for muscle spasm - was seen 9/25 by her orthopedist Dr. Sharol Given, wt bearing with postop boot  HLD: Last LDL was 111/15/16. Patient was on Zocor, but not taking it currently -Continue home medications  HTN: -Continue Lasix and Cozaar - restart PO K supplementation -restart home metoprolol (was on metoprolol in the past, but was stopped  by PCP) -on IV cardizem  Frequent falls: This is likely due to generalized deconditioning and morbid obesity -pt/ot when HR controlled and okay with ortho  Depression and anxiety: Stable, no suicidal or homicidal ideations. -Continue home medications: zoloft  DVT ppx: SCD Code Status: Full code  Family Communication: Yes, patient's family at bed side Disposition Plan:  Anticipate she may need SNF.  Consults called: Cardiology, Orthopedics  Admission status:  SDU/inpatient  DVT Prophylaxis: SCDs in case of surgical intervention   Objective: Vitals:   08/02/16 0300 08/02/16 0347 08/02/16 0400 08/02/16 0500  BP:   (!) 106/44   Pulse: (!) 106 (!) 122  (!) 125  Resp: (!) 26 19  18   Temp: (!) 100.6 F (38.1 C) (!) 100.4 F (38 C) (!) 100.6 F (38.1 C) 99.9 F (37.7 C)  TempSrc: Core (Comment) Core (Comment) Core (Comment) Core (Comment)  SpO2: 96% 93%  96%  Weight:    132.4 kg (291 lb 14.2 oz)  Height:        Intake/Output Summary (Last 24 hours) at 08/02/16 0744 Last data filed at 08/02/16 [id number]  Gross per 24 hour  Intake           219.91 ml  Output             1500 ml  Net         -1280.09 ml   Filed Weights   08/01/16 0346 08/01/16 0400 08/02/16 0500  Weight: 134.3 kg (296 lb 1.2 oz) 134.3 kg (296 lb 1.2 oz) 132.4 kg (291 lb 14.2 oz)     Exam: General:  Alert, oriented, calm, in no acute  distress Neck: supple, no masses, trachea mildline, tongue looks normal, speaking full sentences, voice unchanged, no OP swelling/adenopathy, no stridor Cardiovascular: irregularly irregular, no murmurs or rubs, no peripheral edema  Respiratory: clear to auscultation bilaterally, no wheezes, no crackles, no resp distress Abdomen: soft, nontender, nondistended, normal bowel tones heard  Skin: dry, no rashes  Musculoskeletal: no joint effusions, normal range of motion, right great toe swollen, tender Psychiatric: appropriate affect, normal speech  Neurologic: extraocular  muscles intact, clear speech, moving all extremities with intact sensorium    Data Reviewed: CBC:  Recent Labs Lab 08/01/16 0021 08/01/16 0418 08/02/16 0404  WBC 6.6 6.9 6.0  NEUTROABS 4.9  --   --   HGB 12.1 12.4 10.8*  HCT 36.9 38.0 32.6*  MCV 98.7 98.4 98.5  PLT 168 174 0000000   Basic Metabolic Panel:  Recent Labs Lab 08/01/16 0021 08/01/16 0418 08/02/16 0404  NA 139 139 137  K 4.2 4.0 3.3*  CL 106 106 102  CO2 26 26 29   GLUCOSE 108* 99 98  BUN 25* 24* 15  CREATININE 0.85 0.78 0.57  CALCIUM 8.9 9.0 8.1*  MG  --  2.1  --    GFR: Estimated Creatinine Clearance: 68.6 mL/min (by C-G formula based on SCr of 0.57 mg/dL). Liver Function Tests: No results for input(s): AST, ALT, ALKPHOS, BILITOT, PROT, ALBUMIN in the last 168 hours. No results for input(s): LIPASE, AMYLASE in the last 168 hours. No results for input(s): AMMONIA in the last 168 hours. Coagulation Profile:  Recent Labs Lab 08/01/16 0418  INR 1.02   Cardiac Enzymes:  Recent Labs Lab 08/01/16 0021 08/01/16 0418 08/01/16 1032  TROPONINI <0.03 <0.03 <0.03   BNP (last 3 results) No results for input(s): PROBNP in the last 8760 hours. HbA1C: No results for input(s): HGBA1C in the last 72 hours. CBG:  Recent Labs Lab 08/01/16 0729  GLUCAP 101*   Lipid Profile:  Recent Labs  08/01/16 0418  CHOL 162  HDL 38*  LDLCALC 110*  TRIG 69  CHOLHDL 4.3   Thyroid Function Tests:  Recent Labs  08/01/16 0418  TSH 1.812  FREET4 1.35*   Anemia Panel: No results for input(s): VITAMINB12, FOLATE, FERRITIN, TIBC, IRON, RETICCTPCT in the last 72 hours. Urine analysis:    Component Value Date/Time   COLORURINE yellow 09/23/2010 0825   APPEARANCEUR Clear 09/23/2010 0825   LABSPEC 1.010 09/23/2010 0825   PHURINE 6.5 09/23/2010 0825   HGBUR negative 09/23/2010 0825   BILIRUBINUR Neg 02/11/2016 1150   PROTEINUR Neg 02/11/2016 1150   UROBILINOGEN 2.0 02/11/2016 1150   UROBILINOGEN negative  09/23/2010 0825   NITRITE Neg 02/11/2016 1150   NITRITE negative 09/23/2010 0825   LEUKOCYTESUR Negative 02/11/2016 1150   Sepsis Labs: @LABRCNTIP (procalcitonin:4,lacticidven:4)  ) Recent Results (from the past 240 hour(s))  MRSA PCR Screening     Status: None   Collection Time: 08/01/16  4:53 AM  Result Value Ref Range Status   MRSA by PCR NEGATIVE NEGATIVE Final    Comment:        The GeneXpert MRSA Assay (FDA approved for NASAL specimens only), is one component of a comprehensive MRSA colonization surveillance program. It is not intended to diagnose MRSA infection nor to guide or monitor treatment for MRSA infections.      Studies: No results found.  Scheduled Meds: . aspirin EC  325 mg Oral Daily  . fluticasone  1 spray Each Nare Daily  . furosemide  40 mg Oral BID  .  losartan  50 mg Oral Daily  . metoprolol tartrate  25 mg Oral BID  . multivitamin with minerals  1 tablet Oral Daily  . omega-3 acid ethyl esters  1 g Oral Daily  . potassium chloride  40 mEq Oral Once  . sertraline  50 mg Oral Daily    Continuous Infusions: . diltiazem (CARDIZEM) infusion 5 mg/hr (08/02/16 [id number])     LOS: 1 day   Time spent: 26 minutes  Vonnetta Akey Marry Guan, MD [facility] Pager [phone]  If 7PM-7AM, please contact night-coverage [email or web address] Password [facility] 08/02/2016, 7:44 AM

## 2016-08-02 NOTE — Progress Notes (Signed)
Patient Profile: 82 y/o female with h/o hypertension, hyperlipidemia, depression, adrenal tumor, Barrett's esophagus, who presented to [facility] after sustaining a mechanical fall resulting in right great toe Fx. Found to be in new onset atrial fibrillation w/ RVR.   Subjective: Main complaint today is "cold like symptoms"- nasal congestion, HA and body aches. No cardiac complaints. Denies palpitations. No chest pain.   Objective: Vital signs in last 24 hours: Temp:  [98.1 F (36.7 C)-100.8 F (38.2 C)] 99.1 F (37.3 C) (09/26 0800) Pulse Rate:  [88-141] 106 (09/26 0800) Resp:  [13-28] 15 (09/26 0800) BP: (90-141)/(41-90) 121/65 (09/26 0800) SpO2:  [92 %-99 %] 95 % (09/26 0800) Weight:  [291 lb 14.2 oz (132.4 kg)] 291 lb 14.2 oz (132.4 kg) (09/26 0500) Last BM Date: 07/30/16  Intake/Output from previous day: 09/25 0701 - 09/26 0700 In: 221.8 [I.V.:221.8] Out: 1500 [Urine:1500] Intake/Output this shift: Total I/O In: 5 [I.V.:5] Out: -   Medications Current Facility-Administered Medications  Medication Dose Route Frequency Provider Last Rate Last Dose  . acetaminophen (TYLENOL) tablet 650 mg  650 mg Oral Q4H PRN Ivor Costa, MD   650 mg at 08/02/16 0241  . ALPRAZolam Duanne Moron) tablet 0.25 mg  0.25 mg Oral BID PRN Ivor Costa, MD      . aspirin EC tablet 325 mg  325 mg Oral Daily Ivor Costa, MD   325 mg at 08/01/16 1011  . diltiazem (CARDIZEM) 100 mg in dextrose 5% 173mL (1 mg/mL) infusion  5-15 mg/hr Intravenous Titrated Mir Marry Guan, MD 5 mL/hr at 08/02/16 0637 5 mg/hr at 08/02/16 0637  . fluticasone (FLONASE) 50 MCG/ACT nasal spray 1 spray  1 spray Each Nare Daily Ivor Costa, MD   1 spray at 08/01/16 1416  . furosemide (LASIX) tablet 40 mg  40 mg Oral BID Ivor Costa, MD   40 mg at 08/02/16 0845  . HYDROcodone-acetaminophen (NORCO/VICODIN) 5-325 MG per tablet 1 tablet  1 tablet Oral Q4H PRN Mir Marry Guan, MD   1 tablet at 08/02/16 0240  . losartan (COZAAR) tablet 50  mg  50 mg Oral Daily Ivor Costa, MD   50 mg at 08/01/16 1012  . methocarbamol (ROBAXIN) 500 mg in dextrose 5 % 50 mL IVPB  500 mg Intravenous Q8H PRN Ivor Costa, MD      . metoprolol tartrate (LOPRESSOR) tablet 25 mg  25 mg Oral BID Thayer Headings, MD      . morphine 2 MG/ML injection 1 mg  1 mg Intravenous Q4H PRN Mir Marry Guan, MD   1 mg at 08/01/16 [phone]  . multivitamin with minerals tablet 1 tablet  1 tablet Oral Daily Ivor Costa, MD   1 tablet at 08/01/16 1009  . omega-3 acid ethyl esters (LOVAZA) capsule 1 g  1 g Oral Daily Ivor Costa, MD   1 g at 08/01/16 1011  . ondansetron (ZOFRAN) injection 4 mg  4 mg Intravenous Q8H PRN Ivor Costa, MD      . polyethylene glycol (MIRALAX / GLYCOLAX) packet 17 g  17 g Oral BID PRN Ivor Costa, MD      . potassium chloride 20 MEQ/15ML (10%) solution 40 mEq  40 mEq Oral Once Mir Marry Guan, MD      . sertraline (ZOLOFT) tablet 50 mg  50 mg Oral Daily Ivor Costa, MD   50 mg at 08/01/16 1012  . zolpidem (AMBIEN) tablet 5 mg  5 mg Oral QHS PRN Ivor Costa, MD  PE: General: Pleasant, NAD, elderly  Psych: Normal affect. Neuro: Alert and oriented X 3. Moves all extremities spontaneously. HEENT: Normal           Neck: Supple without bruits or JVD. Lungs:  Resp regular and unlabored, CTA. Heart: irregularly irregular, tachy rate no s3, s4, or murmurs. Abdomen: Soft, non-tender, non-distended, BS + x 4.  Extremities: No clubbing. + swell of right foot. DP/PT/Radials 2+ and equal bilaterally.  Lab Results:   Recent Labs  08/01/16 0021 08/01/16 0418 08/02/16 0404  WBC 6.6 6.9 6.0  HGB 12.1 12.4 10.8*  HCT 36.9 38.0 32.6*  PLT 168 174 176   BMET  Recent Labs  08/01/16 0021 08/01/16 0418 08/02/16 0404  NA 139 139 137  K 4.2 4.0 3.3*  CL 106 106 102  CO2 26 26 29   GLUCOSE 108* 99 98  BUN 25* 24* 15  CREATININE 0.85 0.78 0.57  CALCIUM 8.9 9.0 8.1*   PT/INR  Recent Labs  08/01/16 0418  LABPROT 13.4  INR 1.02    Cholesterol  Recent Labs  08/01/16 0418  CHOL 162   Studies/Results: 2D echo 08/01/16 Study Conclusions  - Left ventricle: The cavity size was normal. Wall thickness was   increased in a pattern of mild LVH. Systolic function was normal.   The estimated ejection fraction was in the range of 60% to 65%.   Assessment/Plan  Principal Problem:   Atrial fibrillation with rapid ventricular response (HCC) Active Problems:   HLD (hyperlipidemia)   Essential hypertension   Severe obesity (BMI >= 40) (HCC)   History of fall   Frequent falls   Thigh pain   Toe fracture, right   Atrial fibrillation with RVR (HCC)   Depression   1. New Onset Atrial Fibrillation: exact onset/ duration unknown. Patient is completely asymptomatic. This is in the setting of pain after recent fall resulting in left great toe fx. There is also concern for underlying UTI as well. Pt had dysuria and pelvic pain last week. Self nursed herself with home remedies and did not seek medical care. Will check a UA today. 2D echo shows normal LVEF of 60-65%. She denies any h/o CP. Enzymes are negative.   --She remains in atrial fibrillation w/ RVR on telemetry. Rate in the 120s. Continues to be asymptomatic. Her BB was changed yesterday to Lopressor, 25 mg BID. She remains on low dose IV Cardizem. 5 mg/hr. BP is stable. Titrate IV Cardizem to 10 mg/hr for additional rate control. We will continue to monitor on telemetry. Her calculated  CHA2DS2 VASc score, from what is known from her history, is 4 (HTN, Age >74 and female sex). However pt considered high risk for recurrent falls. She reports that she has had 3 falls within the past year. She lives with a family member and ambulates with a walker. Per MD, we will hold off on prescribing oral anticoagulation, at this time, as risk may outweigh the benefit. She can further discuss with her PCP and can be reassessed as an outpatient.   2. Right Toe Fx: plan is for conservative  management. Ortho following. WBAT.    3. NSVT: pt noted to have 5 beat run of NSVT on telemetry. 2D echo yesterday showed normal LVEF. K is low at 3.3. Supplement K. Continue BB. Continue to monitor on telemetry.   4. Hypokalemia: K 3.3 today. 40 mEq of K-dur ordered. F/u BMP in the am.     LOS: 1 day  Brittainy M. Ladoris Gene 08/02/2016 8:57 AM   Attending Note:   The patient was seen and examined.  Agree with assessment and plan as noted above.  Changes made to the above note as needed.  Patient seen and independently examined with Lyda Jester, PA .   We discussed all aspects of the encounter. I agree with the assessment and plan as stated above.  1.  Atrial fib with RVR : Will increase metoprol to 50 BID. Will hopefully be able to titrate the Dilt drip soon.  Echo shows normal LV function with EF 60-65%. She is not on anticoagulation due to her hx of frequent falls   2. Essential HTN:   Stable      I have spent a total of 40 minutes with patient reviewing hospital  notes , telemetry, EKGs, labs and examining patient as well as establishing an assessment and plan that was discussed with the patient. > 50% of time was spent in direct patient care.    Thayer Headings, Brooke Bonito., MD, [facility] 08/02/2016, 11:34 AM [address],  [place] Pager [phone]

## 2016-08-03 DIAGNOSIS — F329 Major depressive disorder, single episode, unspecified: Secondary | ICD-10-CM

## 2016-08-03 DIAGNOSIS — Z9181 History of falling: Secondary | ICD-10-CM

## 2016-08-03 LAB — GLUCOSE, CAPILLARY: Glucose-Capillary: 81 mg/dL (ref 65–99)

## 2016-08-03 LAB — CBC
HEMATOCRIT: 34 % — AB (ref 36.0–46.0)
HEMOGLOBIN: 11 g/dL — AB (ref 12.0–15.0)
MCH: 32.3 pg (ref 26.0–34.0)
MCHC: 32.4 g/dL (ref 30.0–36.0)
MCV: 99.7 fL (ref 78.0–100.0)
Platelets: 190 10*3/uL (ref 150–400)
RBC: 3.41 MIL/uL — AB (ref 3.87–5.11)
RDW: 14.4 % (ref 11.5–15.5)
WBC: 6.3 10*3/uL (ref 4.0–10.5)

## 2016-08-03 LAB — BASIC METABOLIC PANEL
ANION GAP: 7 (ref 5–15)
BUN: 21 mg/dL — ABNORMAL HIGH (ref 6–20)
CALCIUM: 8.5 mg/dL — AB (ref 8.9–10.3)
CHLORIDE: 101 mmol/L (ref 101–111)
CO2: 30 mmol/L (ref 22–32)
Creatinine, Ser: 0.78 mg/dL (ref 0.44–1.00)
GFR calc non Af Amer: >60 mL/min (ref >60)
Glucose, Bld: 100 mg/dL — ABNORMAL HIGH (ref 65–99)
POTASSIUM: 4.1 mmol/L (ref 3.5–5.1)
Sodium: 138 mmol/L (ref 135–145)

## 2016-08-03 LAB — MAGNESIUM: MAGNESIUM: 2 mg/dL (ref 1.7–2.4)

## 2016-08-03 MED ORDER — DILTIAZEM HCL 30 MG PO TABS
30.0000 mg | ORAL_TABLET | Freq: Four times a day (QID) | ORAL | Status: DC
  Administered 2016-08-03 – 2016-08-05 (×8): 30 mg via ORAL
  Filled 2016-08-03 (×8): qty 1

## 2016-08-03 MED ORDER — AZITHROMYCIN 250 MG PO TABS
250.0000 mg | ORAL_TABLET | Freq: Every day | ORAL | Status: AC
  Administered 2016-08-04 – 2016-08-07 (×4): 250 mg via ORAL
  Filled 2016-08-03 (×4): qty 1

## 2016-08-03 MED ORDER — ALBUTEROL SULFATE (2.5 MG/3ML) 0.083% IN NEBU
2.5000 mg | INHALATION_SOLUTION | RESPIRATORY_TRACT | Status: DC | PRN
  Administered 2016-08-03: 2.5 mg via RESPIRATORY_TRACT

## 2016-08-03 MED ORDER — CEFUROXIME AXETIL 250 MG PO TABS
250.0000 mg | ORAL_TABLET | Freq: Two times a day (BID) | ORAL | Status: DC
  Administered 2016-08-03 – 2016-08-10 (×15): 250 mg via ORAL
  Filled 2016-08-03 (×17): qty 1

## 2016-08-03 MED ORDER — AZITHROMYCIN 250 MG PO TABS
500.0000 mg | ORAL_TABLET | Freq: Every day | ORAL | Status: AC
  Administered 2016-08-03: 500 mg via ORAL
  Filled 2016-08-03 (×2): qty 2

## 2016-08-03 NOTE — Evaluation (Signed)
Occupational Therapy Evaluation Patient Details Name: Norma Ford MRN: [id number] DOB: [birth year] Today's Date: 08/03/2016    History of Present Illness 82 y/o female with h/o hypertension, hyperlipidemia, depression, adrenal tumor, Barrett's esophagus, who presented to [facility] after sustaining a mechanical fall resulting in right great toe Fx. Found to be in new onset atrial fibrillation w/ RVR.    Clinical Impression   Pt was admitted for the above.  She had assistance for adls at home and ambulated with RW.  Pt will benefit from continued OT focusing on toilet transfers and mobility related to ADLs to decrease burden of care.  Pt currently needs mod +2 assist for sit to stand and transfers; goals are for min (+2 for safety with transfers)    Follow Up Recommendations  Supervision/Assistance - 24 hour;Home health OT (as long as pt progresses enough for caregivers to manage)    Equipment Recommendations  3 in 1 bedside comode (if she doesn't have this)    Recommendations for Other Services       Precautions / Restrictions Precautions Precautions: Fall Other Brace/Splint: post op shoe on R Restrictions Other Position/Activity Restrictions: WBAT RLE with post op shoe      Mobility Bed Mobility         Supine to sit: Min assist;+2 for physical assistance     General bed mobility comments: assist to guide legs and for trunk  Transfers   Equipment used: Rolling walker (2 wheeled)   Sit to Stand: Mod assist;+2 physical assistance Stand pivot transfers: Min assist;+2 safety/equipment       General transfer comment: cues for UE placement.  Noted pt's RLE supinates in post op shoe    Balance                                            ADL Overall ADL's : Needs assistance/impaired                         Toilet Transfer: Moderate assistance;+2 for physical assistance;Stand-pivot;RW (to chair.  Mod for sit to stand from elevated bed;  min--step)             General ADL Comments: NT came and assisted with bathing during evaluation.  At baseline, pt has assistance for all adls except for feeding herself.  Pt had 3/4 dyspnea during transfer.  VSS  HR 84-105     Vision     Perception     Praxis      Pertinent Vitals/Pain Pain Assessment: No/denies pain     Hand Dominance     Extremity/Trunk Assessment Upper Extremity Assessment Upper Extremity Assessment: RUE deficits/detail;LUE deficits/detail RUE Deficits / Details: has bil UE pain and weakness.  Reports rotator cuff problems bilaterally.  Able to lift RUE approximately 20 degrees FF; 40 for LUE.  Does not abduct.  hands wfls LUE Deficits / Details: see above           Communication Communication Communication: No difficulties   Cognition Arousal/Alertness: Awake/alert Behavior During Therapy: WFL for tasks assessed/performed Overall Cognitive Status: Within Functional Limits for tasks assessed                     General Comments       Exercises       Shoulder Instructions  Home Living Family/patient expects to be discharged to:: Private residence Living Arrangements: Other relatives Available Help at Discharge: Available 24 hours/day               Bathroom Shower/Tub:  (walk in tub with seat)         Home Equipment: Gilford Rile - 2 wheels;Walker - 4 wheels;Wheelchair - power;Hospital bed   Additional Comments: they have a lift which only accommodates 200#'s; havent been using it      Prior Functioning/Environment      ADL's / Homemaking Assistance Needed: assistance for adls from caregiver.  She can feed herself            OT Problem List: Decreased strength;Decreased activity tolerance;Impaired balance (sitting and/or standing);Cardiopulmonary status limiting activity   OT Treatment/Interventions: Self-care/ADL training;DME and/or AE instruction;Therapeutic activities;Patient/family education;Balance  training    OT Goals(Current goals can be found in the care plan section) Acute Rehab OT Goals Patient Stated Goal: get better OT Goal Formulation: With patient Time For Goal Achievement: 08/17/16 Potential to Achieve Goals: Good ADL Goals Pt Will Transfer to Toilet: with min assist;with +2 assist;stand pivot transfer;bedside commode Additional ADL Goal #1: pt will go from sit to stand with min A and maintain for 2 minutes with min guard during adls Additional ADL Goal #2: pt will perform bed mobility with min A  OT Frequency: Min 2X/week   Barriers to D/C:            Co-evaluation              End of Session    Activity Tolerance: Patient tolerated treatment well Patient left: in chair;with call bell/phone within reach;with nursing/sitter in room;with restraints reapplied   Time: 1102-1126 OT Time Calculation (min): 24 min Charges:  OT General Charges $OT Visit: 1 Procedure OT Evaluation $OT Eval Moderate Complexity: 1 Procedure OT Treatments $Therapeutic Activity: 8-22 mins G-Codes:    Ragan Duhon Aug 07, 2016, 1:00 PM Lesle Chris, OTR/L [phone] 2016-08-07

## 2016-08-03 NOTE — Progress Notes (Addendum)
Date:  August 03, 2016 Chart reviewed for concurrent status and case management needs. Will continue to follow the patient for changes and needs: temp-100.4/labs wnl/bp-83/37/a.fib on iv Cardizem drip. Discharge Planning: following for needs Velva Harman, BSN, [place], [place]

## 2016-08-03 NOTE — Progress Notes (Addendum)
PROGRESS NOTE  Norma Ford [id number] DOB: October 16, 1930 DOA: 07/31/2016 PCP: [facility]/Subjective: 82 y.o. female with medical history significant of hypertension, hyperlipidemia, depression, adrenal tumor, Barrett's esophagus, mycosis fungoides lymph nodes, morbid obesity, frequent fall, who presents with fall, right great toe pain, bilateral thigh pain. She has right great toe fracture and was found to be in Rapid Afib which is a new diagnosis for her.   She has been started on BID PO metoprolol by cardiology and still on IV diltiazem drip at 5mg /hr. Family at bedside, patient is comfortable, denies chest pain, dizziness. She developed a sensation of swollen tongue last night, but says it is stable, denies trouble swallowing, shortness of breath, pain/throat pain or stridor.  Subjective Complaining of continued generalized malaise and URI type symptoms  Assessment/Plan: Atrial fibrillation with rapid ventricular response [facility]): Patient noted to have new onset atrial fibrillation with heart rates up to 135.  Heart rates were difficult to control and patient had been continued on Cardizem drip..  - Cardiology is following and patient has been transitioned off of Cardizem drip to by mouth today. - For now continue cont metoprolol BID - We'll continue ASA 325 mg daily - Nitroglycerin, Morphine, and aspirin, lipitor  - 2d echo unremarkable - agree to hold full anticoagulation for now given history of falls  Right great toe fracture and bilateral thigh pain: No evidence of hip Fx by X-ray. - Pain control: morphine prn and percocet - Continue as needed Zofran for nausea - Continue Robaxin for muscle spasm - was seen 9/25 by her orthopedist Dr. Sharol Given, wt bearing with postop boot  HLD: Last LDL was 111/15/16. Patient was on Zocor, but not taking it currently -For now, continue home medications  HTN: -Continue Lasix and Cozaar - restart PO K  supplementation -restart home metoprolol (was on metoprolol in the past, but was stopped by PCP) -on IV cardizem  Frequent falls:  -This is likely due to generalized deconditioning and morbid obesity -Weightbearing as tolerated per orthopedics. Physical therapy, occupational therapy following.  Depression and anxiety: Stable, no suicidal or homicidal ideations. -Continue home medications: zoloft  DVT ppx: SCD Code Status: Full code  Family Communication: Yes, patient's family at bed side Disposition Plan:  Anticipate she may need SNF.  Consults called: Cardiology, Orthopedics  Admission status:  SDU/inpatient  DVT Prophylaxis: SCDs in case of surgical intervention   Objective: Vitals:   08/03/16 0533 08/03/16 0600 08/03/16 0655 08/03/16 0800  BP:  107/60  (!) 110/49  Pulse:  92 95 97  Resp:  15 (!) 22 18  Temp:  100.2 F (37.9 C) 99.9 F (37.7 C) 99.7 F (37.6 C)  TempSrc:  Core (Comment)  Core (Comment)  SpO2:  92% 90% 91%  Weight: 134.2 kg (295 lb 13.7 oz)     Height:        Intake/Output Summary (Last 24 hours) at 08/03/16 0830 Last data filed at 08/03/16 0800  Gross per 24 hour  Intake          1094.67 ml  Output             1445 ml  Net          -350.33 ml   Filed Weights   08/01/16 0400 08/02/16 0500 08/03/16 0533  Weight: 134.3 kg (296 lb 1.2 oz) 132.4 kg (291 lb 14.2 oz) 134.2 kg (295 lb 13.7 oz)     Exam: General:  Alert, oriented,  calm, in no acute distress, Lying in bed Neck: supple, no masses, trachea mildline, neck supple Cardiovascular: irregularly irregular, S1-S2 Respiratory: clear to auscultation bilaterally, normal respiratory effort, no wheezing Abdomen: soft, nontender, nondistended, normal bowel sounds Skin: dry, no rashes  Musculoskeletal: no joint effusions, normal range of motion, right great toe swollen, tender Psychiatric: appropriate affect, normal speech no auditory or visual hallucinations Neurologic: [place] 2-12 grossly  intact, no tremors   Data Reviewed: CBC:  Recent Labs Lab 08/01/16 0021 08/01/16 0418 08/02/16 0404 08/03/16 0401  WBC 6.6 6.9 6.0 6.3  NEUTROABS 4.9  --   --   --   HGB 12.1 12.4 10.8* 11.0*  HCT 36.9 38.0 32.6* 34.0*  MCV 98.7 98.4 98.5 99.7  PLT 168 174 176 [id number]   Basic Metabolic Panel:  Recent Labs Lab 08/01/16 0021 08/01/16 0418 08/02/16 0404 08/03/16 0401  NA 139 139 137 138  K 4.2 4.0 3.3* 4.1  CL 106 106 102 101  CO2 26 26 29 30   GLUCOSE 108* 99 98 100*  BUN 25* 24* 15 21*  CREATININE 0.85 0.78 0.57 0.78  CALCIUM 8.9 9.0 8.1* 8.5*  MG  --  2.1  --  2.0   GFR: Estimated Creatinine Clearance: 69.2 mL/min (by C-G formula based on SCr of 0.78 mg/dL). Liver Function Tests: No results for input(s): AST, ALT, ALKPHOS, BILITOT, PROT, ALBUMIN in the last 168 hours. No results for input(s): LIPASE, AMYLASE in the last 168 hours. No results for input(s): AMMONIA in the last 168 hours. Coagulation Profile:  Recent Labs Lab 08/01/16 0418  INR 1.02   Cardiac Enzymes:  Recent Labs Lab 08/01/16 0021 08/01/16 0418 08/01/16 1032  TROPONINI <0.03 <0.03 <0.03   BNP (last 3 results) No results for input(s): PROBNP in the last 8760 hours. HbA1C: No results for input(s): HGBA1C in the last 72 hours. CBG:  Recent Labs Lab 08/01/16 0729 08/02/16 0803 08/03/16 0810  GLUCAP 101* 75 81   Lipid Profile:  Recent Labs  08/01/16 0418  CHOL 162  HDL 38*  LDLCALC 110*  TRIG 69  CHOLHDL 4.3   Thyroid Function Tests:  Recent Labs  08/01/16 0418  TSH 1.812  FREET4 1.35*   Anemia Panel: No results for input(s): VITAMINB12, FOLATE, FERRITIN, TIBC, IRON, RETICCTPCT in the last 72 hours. Urine analysis:    Component Value Date/Time   COLORURINE AMBER (A) 08/02/2016 0934   APPEARANCEUR CLOUDY (A) 08/02/2016 0934   LABSPEC 1.023 08/02/2016 0934   PHURINE 7.5 08/02/2016 0934   GLUCOSEU NEGATIVE 08/02/2016 0934   HGBUR NEGATIVE 08/02/2016 0934   HGBUR  negative 09/23/2010 0825   BILIRUBINUR NEGATIVE 08/02/2016 0934   BILIRUBINUR Neg 02/11/2016 1150   KETONESUR NEGATIVE 08/02/2016 0934   PROTEINUR NEGATIVE 08/02/2016 0934   UROBILINOGEN 2.0 02/11/2016 1150   UROBILINOGEN negative 09/23/2010 0825   NITRITE POSITIVE (A) 08/02/2016 0934   LEUKOCYTESUR LARGE (A) 08/02/2016 0934   Sepsis Labs: @LABRCNTIP (procalcitonin:4,lacticidven:4)  ) Recent Results (from the past 240 hour(s))  MRSA PCR Screening     Status: None   Collection Time: 08/01/16  4:53 AM  Result Value Ref Range Status   MRSA by PCR NEGATIVE NEGATIVE Final    Comment:        The GeneXpert MRSA Assay (FDA approved for NASAL specimens only), is one component of a comprehensive MRSA colonization surveillance program. It is not intended to diagnose MRSA infection nor to guide or monitor treatment for MRSA infections.      Studies:  No results found.  Scheduled Meds: . aspirin EC  325 mg Oral Daily  . fluticasone  1 spray Each Nare Daily  . furosemide  40 mg Oral BID  . losartan  50 mg Oral Daily  . metoprolol tartrate  50 mg Oral BID  . multivitamin with minerals  1 tablet Oral Daily  . omega-3 acid ethyl esters  1 g Oral Daily  . sertraline  50 mg Oral Daily    Continuous Infusions: . diltiazem (CARDIZEM) infusion 10 mg/hr (08/03/16 0800)     LOS: 2 days   Time spent: 26 minutes  CHIU, Orpah Melter, MD [facility] Pager [phone] If 7PM-7AM, please contact night-coverage [email or web address] Password [email or web address] 08/03/2016, 8:30 AM

## 2016-08-03 NOTE — Progress Notes (Signed)
Patient Name: Norma Ford Date of Encounter: 08/03/2016  Primary Cardiologist: Joaquim Nam, MD   Hospital Problem List     Principal Problem:   Atrial fibrillation with rapid ventricular response [facility]) Active Problems:   Toe fracture, right   Essential hypertension   Severe obesity (BMI >= 40) (HCC)   HLD (hyperlipidemia)   History of fall   Frequent falls   Depression   Thigh pain     Subjective   In good spirits.  Notes mild "cold symptoms" but otw feels well.  Denies c/p, dyspnea, or palpitations.  Inpatient Medications    . aspirin EC  325 mg Oral Daily  . [START ON 08/04/2016] azithromycin  250 mg Oral Daily  . cefUROXime  250 mg Oral BID WC  . fluticasone  1 spray Each Nare Daily  . furosemide  40 mg Oral BID  . losartan  50 mg Oral Daily  . metoprolol tartrate  50 mg Oral BID  . multivitamin with minerals  1 tablet Oral Daily  . omega-3 acid ethyl esters  1 g Oral Daily  . sertraline  50 mg Oral Daily    Vital Signs    Vitals:   08/03/16 0800 08/03/16 0900 08/03/16 0930 08/03/16 1000  BP: (!) 110/49 (!) 94/44 (!) 97/51 117/70  Pulse: 97 (!) 108 94 (!) 145  Resp: 18 (!) 24 20 (!) 21  Temp: 99.7 F (37.6 C) 99.9 F (37.7 C) 99.9 F (37.7 C) 99.7 F (37.6 C)  TempSrc: Core (Comment)     SpO2: 91% (!) 87% (!) 88% 90%  Weight:      Height:        Intake/Output Summary (Last 24 hours) at 08/03/16 1053 Last data filed at 08/03/16 0951  Gross per 24 hour  Intake              963 ml  Output             1385 ml  Net             -422 ml   Filed Weights   08/01/16 0400 08/02/16 0500 08/03/16 0533  Weight: 296 lb 1.2 oz (134.3 kg) 291 lb 14.2 oz (132.4 kg) 295 lb 13.7 oz (134.2 kg)    Physical Exam   GEN: Well nourished, well developed, in no acute distress.  HEENT: Grossly normal.  Neck: Supple, obese, difficult to gauge JVP, no carotid bruits, or masses. Cardiac: IR, IR, no murmurs, rubs, or gallops. No clubbing, cyanosis, 1+ bilat LE edema.   Radials/DP/PT 2+ and equal bilaterally.  Respiratory:  Respirations regular and unlabored, bibasilar crackles - cleared some with subsequent breaths. GI: Soft, nontender, nondistended, BS + x 4. MS: no deformity or atrophy. Skin: warm and dry, no rash. Neuro:  Strength and sensation are intact. Psych: AAOx3.  Normal affect.  Labs    CBC  Recent Labs  08/01/16 0021  08/02/16 0404 08/03/16 0401  WBC 6.6  < > 6.0 6.3  NEUTROABS 4.9  --   --   --   HGB 12.1  < > 10.8* 11.0*  HCT 36.9  < > 32.6* 34.0*  MCV 98.7  < > 98.5 99.7  PLT 168  < > 176 190  < > = values in this interval not displayed. Basic Metabolic Panel  Recent Labs  08/01/16 0418 08/02/16 0404 08/03/16 0401  NA 139 137 138  K 4.0 3.3* 4.1  CL 106 102 101  CO2 26  29 30  GLUCOSE 99 98 100*  BUN 24* 15 21*  CREATININE 0.78 0.57 0.78  CALCIUM 9.0 8.1* 8.5*  MG 2.1  --  2.0   Cardiac Enzymes  Recent Labs  08/01/16 0021 08/01/16 0418 08/01/16 1032  TROPONINI <0.03 <0.03 <0.03   Fasting Lipid Panel  Recent Labs  08/01/16 0418  CHOL 162  HDL 38*  LDLCALC 110*  TRIG 69  CHOLHDL 4.3   Thyroid Function Tests  Recent Labs  08/01/16 0418  TSH 1.812    Telemetry    Afib - 80's to 90's.  Occas 1.6 sec pause.  No prolonged pauses.  Radiology    No results found. 2D Echocardiogram 9.25.2017 ------------------------------------------------------------------- Study Conclusions   - Left ventricle: The cavity size was normal. Wall thickness was   increased in a pattern of mild LVH. Systolic function was normal.   The estimated ejection fraction was in the range of 60% to 65%.  Patient Profile     82 y/o female with h/o hypertension, hyperlipidemia, depression, adrenal tumor, Barrett's esophagus, who presented to [facility] after sustaining a mechanical fall resulting in right great toe Fx. Found to be in new onset atrial fibrillation w/ RVR.   Assessment & Plan    1.  R Great Toe Fx/Fall:  No  significant pain.  Wearing post-op shoe.  Ortho has prev seen.  2.  Afib RVR:  In setting of above.  Unclear of time of onset.  Pt noted fatigue a few days prior to admission bu was not having palpitations/c/p/dyspnea.  Rate currently controlled 80's to 90's on lopressor 50 bid and dilt gtt @ 5 mg/hr.  She was on 10 mg/hr earlier this AM but following am metoprolol, BP dropped and nsg down titrated dilt to 5.  Rates currently 80's.  Will d/c IV dilt and convert to short acting dilt today @ 30 q 6 - titrate to 60 q 6 if hrs elevate.  Despite CHA2DS2VASc of 4, she is not on [place] 2/2 h/o multiple falls.  No plans for dccv @ this time.  3.  Essential HTN:  Stable on  blocker and dilt.  Signed, Murray Hodgkins NP 08/03/2016, 10:53 AM    Attending Note:   The patient was seen and examined.  Agree with assessment and plan as noted above.  Changes made to the above note as needed.  Patient seen and independently examined with Ignacia Bayley, NP .   We discussed all aspects of the encounter. I agree with the assessment and plan as stated above.  Pt is doing well. Off the Dilt drip. On PO metoprolol and just started PO Dilt We may not need the additional diltiazem - I would favor the metoprolol instead of the Dilt if she cannot tolerate both    I have spent a total of 20 minutes with patient reviewing hospital  notes , telemetry, EKGs, labs and examining patient as well as establishing an assessment and plan that was discussed with the patient. > 50% of time was spent in direct patient care.    Thayer Headings, Brooke Bonito., MD, [facility] 08/03/2016, 12:29 PM [address]  [place] Pager [phone]

## 2016-08-03 NOTE — Progress Notes (Signed)
Received pt from ICU, Pt oriented to room, alert and oriented, stable condition. Daughter and daughter-n-law at bedside. Denies pain. Assessment unchanged. Will cont to follow up with patient. SRP, RN

## 2016-08-04 ENCOUNTER — Inpatient Hospital Stay (HOSPITAL_COMMUNITY): Payer: Medicare Other

## 2016-08-04 DIAGNOSIS — I5032 Chronic diastolic (congestive) heart failure: Secondary | ICD-10-CM | POA: Diagnosis present

## 2016-08-04 DIAGNOSIS — R062 Wheezing: Secondary | ICD-10-CM

## 2016-08-04 LAB — CBC AND DIFFERENTIAL
HEMATOCRIT: 37 % (ref 36–46)
HEMOGLOBIN: 11.6 g/dL — AB (ref 12.0–16.0)
PLATELETS: 211 10*3/uL (ref 150–399)
WBC: 5.6 10*3/mL

## 2016-08-04 LAB — CBC
HCT: 36.5 % (ref 36.0–46.0)
HEMOGLOBIN: 11.6 g/dL — AB (ref 12.0–15.0)
MCH: 31.9 pg (ref 26.0–34.0)
MCHC: 31.8 g/dL (ref 30.0–36.0)
MCV: 100.3 fL — ABNORMAL HIGH (ref 78.0–100.0)
Platelets: 211 10*3/uL (ref 150–400)
RBC: 3.64 MIL/uL — AB (ref 3.87–5.11)
RDW: 14.3 % (ref 11.5–15.5)
WBC: 5.6 10*3/uL (ref 4.0–10.5)

## 2016-08-04 LAB — BASIC METABOLIC PANEL
ANION GAP: 6 (ref 5–15)
BUN: 22 mg/dL — ABNORMAL HIGH (ref 6–20)
CALCIUM: 8.4 mg/dL — AB (ref 8.9–10.3)
CO2: 32 mmol/L (ref 22–32)
Chloride: 100 mmol/L — ABNORMAL LOW (ref 101–111)
Creatinine, Ser: 0.77 mg/dL (ref 0.44–1.00)
Glucose, Bld: 88 mg/dL (ref 65–99)
Potassium: 4.2 mmol/L (ref 3.5–5.1)
Sodium: 138 mmol/L (ref 135–145)

## 2016-08-04 LAB — GLUCOSE, CAPILLARY: GLUCOSE-CAPILLARY: 94 mg/dL (ref 65–99)

## 2016-08-04 MED ORDER — METHYLPREDNISOLONE SODIUM SUCC 40 MG IJ SOLR
40.0000 mg | Freq: Two times a day (BID) | INTRAMUSCULAR | Status: DC
  Administered 2016-08-04 – 2016-08-10 (×12): 40 mg via INTRAVENOUS
  Filled 2016-08-04 (×13): qty 1

## 2016-08-04 MED ORDER — LEVALBUTEROL HCL 0.63 MG/3ML IN NEBU
0.6300 mg | INHALATION_SOLUTION | Freq: Four times a day (QID) | RESPIRATORY_TRACT | Status: DC
  Administered 2016-08-04: 0.63 mg via RESPIRATORY_TRACT
  Filled 2016-08-04: qty 3

## 2016-08-04 MED ORDER — LEVALBUTEROL HCL 0.63 MG/3ML IN NEBU
0.6300 mg | INHALATION_SOLUTION | Freq: Three times a day (TID) | RESPIRATORY_TRACT | Status: DC
  Administered 2016-08-04 – 2016-08-06 (×4): 0.63 mg via RESPIRATORY_TRACT
  Filled 2016-08-04 (×5): qty 3

## 2016-08-04 NOTE — Progress Notes (Signed)
PROGRESS NOTE  Norma Ford [id number] DOB: 1930/10/01 DOA: 07/31/2016 PCP: [facility]/Subjective: 82 y.o. female with medical history significant of hypertension, hyperlipidemia, depression, adrenal tumor, Barrett's esophagus, mycosis fungoides lymph nodes, morbid obesity, frequent fall, who presents with fall, right great toe pain, bilateral thigh pain. She has right great toe fracture and was found to be in Rapid Afib which is a new diagnosis for her.   She has been started on BID PO metoprolol by cardiology and still on IV diltiazem drip at 5mg /hr. Family at bedside, patient is comfortable, denies chest pain, dizziness. She developed a sensation of swollen tongue last night, but says it is stable, denies trouble swallowing, shortness of breath, pain/throat pain or stridor.  Subjective Reports subjective shortness of breath  Assessment/Plan: Atrial fibrillation with rapid ventricular response [facility]): Patient initially noted to have new onset atrial fibrillation with heart rates up to 135.  Heart rates were difficult to control and patient was continued on Cardizem drip..  - Cardiology is following and patient is now off Cardizem drip and on PO cardizem. - For now continue cont metoprolol BID - Patient is continued on ASA 325 mg daily - 2d echo was unremarkable - cont to hold full anticoagulation given history of falls  Right great toe fracture and bilateral thigh pain: No evidence of hip Fx by X-ray. - Pain control: morphine prn and percocet - Continue as needed Zofran for nausea - For now, continue Robaxin for muscle spasm - On 9/25, patient was seen by Dr. Sharol Given who recommends wt bearing with postop boot  HLD: Last LDL was 111/15/16. Patient was on Zocor, but not taking it currently -Patient is continued on home medications  HTN: -Continue Lasix and Cozaar - restart PO K supplementation -Patient on by mouth Cardizem and by mouth metoprolol  at 50 mg twice daily  Frequent falls:  -Suspect secondary to generalized deconditioning as well as morbid obesity -Plan to continue weightbearing as tolerated per orthopedic recommendations. Physical therapy, occupational already consulted.  Depression and anxiety: Stable, no suicidal or homicidal ideations. -Continue home medications: zoloft  Suspected acute bronchitis versus hospital-acquired pneumonia - New issue -wheezing on exam. Patient requiring 3L [place] this AM -Have ordered CXR. Personally reviewed by myself. Findings of a new hilar airspace disease. Given recent fevers and increased cough, concern for infectious process. -started bronchodilators and IV steroids -Albuterol changed to xopenex per Cardiolgy -Patient is continued on azithromycin and Ceftin which should cover for pneumonia.  DVT ppx: SCD Code Status: Full code  Family Communication: Yes, patient's family at bed side Disposition Plan:  Anticipate she may need SNF.  Consults called: Cardiology, Orthopedics  Admission status:  SDU/inpatient  DVT Prophylaxis: SCDs in case of surgical intervention   Objective: Vitals:   08/04/16 0940 08/04/16 1301 08/04/16 1339 08/04/16 1438  BP:  119/62    Pulse:  74    Resp:  20    Temp:  98.9 F (37.2 C)    TempSrc:  Oral    SpO2: 93% 95% 95% 95%  Weight:      Height:        Intake/Output Summary (Last 24 hours) at 08/04/16 1528 Last data filed at 08/04/16 1257  Gross per 24 hour  Intake              390 ml  Output             2525 ml  Net            -  2135 ml   Filed Weights   08/02/16 0500 08/03/16 0533 08/04/16 0453  Weight: 132.4 kg (291 lb 14.2 oz) 134.2 kg (295 lb 13.7 oz) 133.5 kg (294 lb 5 oz)     Exam: General:  Alert, Awake, lying in bed, conversant Neck: Trachea midline, no masses Cardiovascular: irregularly irregular, regular rate, S1-S2 Respiratory: End expiratory wheezing auscultated, decreased breath sounds, mildly increased respiratory  effort Abdomen: Obese, positive bowel sounds, nondistended Skin: Normal skin turgor, no rashes seen Musculoskeletal: Perfused, no clubbing or cyanosis Psychiatric: Mood and affect appear normal, no auditory or visual hallucinations Neurologic: Cranial nerves 2-12 grossly intact , sensation intact, no seizures   Data Reviewed: CBC:  Recent Labs Lab 08/01/16 0021 08/01/16 0418 08/02/16 0404 08/03/16 0401 08/04/16 0455  WBC 6.6 6.9 6.0 6.3 5.6  NEUTROABS 4.9  --   --   --   --   HGB 12.1 12.4 10.8* 11.0* 11.6*  HCT 36.9 38.0 32.6* 34.0* 36.5  MCV 98.7 98.4 98.5 99.7 100.3*  PLT 168 174 176 190 [id number]   Basic Metabolic Panel:  Recent Labs Lab 08/01/16 0021 08/01/16 0418 08/02/16 0404 08/03/16 0401 08/04/16 0455  NA 139 139 137 138 138  K 4.2 4.0 3.3* 4.1 4.2  CL 106 106 102 101 100*  CO2 26 26 29 30  32  GLUCOSE 108* 99 98 100* 88  BUN 25* 24* 15 21* 22*  CREATININE 0.85 0.78 0.57 0.78 0.77  CALCIUM 8.9 9.0 8.1* 8.5* 8.4*  MG  --  2.1  --  2.0  --    GFR: Estimated Creatinine Clearance: 69 mL/min (by C-G formula based on SCr of 0.77 mg/dL). Liver Function Tests: No results for input(s): AST, ALT, ALKPHOS, BILITOT, PROT, ALBUMIN in the last 168 hours. No results for input(s): LIPASE, AMYLASE in the last 168 hours. No results for input(s): AMMONIA in the last 168 hours. Coagulation Profile:  Recent Labs Lab 08/01/16 0418  INR 1.02   Cardiac Enzymes:  Recent Labs Lab 08/01/16 0021 08/01/16 0418 08/01/16 1032  TROPONINI <0.03 <0.03 <0.03   BNP (last 3 results) No results for input(s): PROBNP in the last 8760 hours. HbA1C: No results for input(s): HGBA1C in the last 72 hours. CBG:  Recent Labs Lab 08/01/16 0729 08/02/16 0803 08/03/16 0810 08/04/16 0732  GLUCAP 101* 75 81 94   Lipid Profile: No results for input(s): CHOL, HDL, LDLCALC, TRIG, CHOLHDL, LDLDIRECT in the last 72 hours. Thyroid Function Tests: No results for input(s): TSH, T4TOTAL,  FREET4, T3FREE, THYROIDAB in the last 72 hours. Anemia Panel: No results for input(s): VITAMINB12, FOLATE, FERRITIN, TIBC, IRON, RETICCTPCT in the last 72 hours. Urine analysis:    Component Value Date/Time   COLORURINE AMBER (A) 08/02/2016 0934   APPEARANCEUR CLOUDY (A) 08/02/2016 0934   LABSPEC 1.023 08/02/2016 0934   PHURINE 7.5 08/02/2016 [place] 08/02/2016 0934   HGBUR NEGATIVE 08/02/2016 0934   HGBUR negative 09/23/2010 0825   BILIRUBINUR NEGATIVE 08/02/2016 0934   BILIRUBINUR Neg 02/11/2016 1150   KETONESUR NEGATIVE 08/02/2016 0934   PROTEINUR NEGATIVE 08/02/2016 0934   UROBILINOGEN 2.0 02/11/2016 1150   UROBILINOGEN negative 09/23/2010 0825   NITRITE POSITIVE (A) 08/02/2016 0934   LEUKOCYTESUR LARGE (A) 08/02/2016 0934   Sepsis Labs: @LABRCNTIP (procalcitonin:4,lacticidven:4)  ) Recent Results (from the past 240 hour(s))  MRSA PCR Screening     Status: None   Collection Time: 08/01/16  4:53 AM  Result Value Ref Range Status   MRSA by PCR  NEGATIVE NEGATIVE Final    Comment:        The GeneXpert MRSA Assay (FDA approved for NASAL specimens only), is one component of a comprehensive MRSA colonization surveillance program. It is not intended to diagnose MRSA infection nor to guide or monitor treatment for MRSA infections.      Studies: Dg Chest Port 1 View  Result Date: 08/04/2016 CLINICAL DATA:  F/u pneumonia; sob and wheezing EXAM: PORTABLE CHEST 1 VIEW COMPARISON:  08/01/2016 FINDINGS: Exam is lordotic. Extremely low lung volumes. There is increased perihilar vascular congestion. New perihilar airspace disease. No pneumothorax IMPRESSION: New perihilar airspace disease suggests pulmonary edema. Low lung volumes the Electronically Signed   By: Suzy Bouchard M.D.   On: 08/04/2016 08:28    Scheduled Meds: . aspirin EC  325 mg Oral Daily  . azithromycin  250 mg Oral Daily  . cefUROXime  250 mg Oral BID WC  . diltiazem  30 mg Oral Q6H  .  fluticasone  1 spray Each Nare Daily  . furosemide  40 mg Oral BID  . levalbuterol  0.63 mg Nebulization TID  . losartan  50 mg Oral Daily  . methylPREDNISolone (SOLU-MEDROL) injection  40 mg Intravenous Q12H  . metoprolol tartrate  50 mg Oral BID  . multivitamin with minerals  1 tablet Oral Daily  . omega-3 acid ethyl esters  1 g Oral Daily  . sertraline  50 mg Oral Daily    Continuous Infusions:     LOS: 3 days    CHIU, Orpah Melter, MD [facility] Pager [phone] If 7PM-7AM, please contact night-coverage [email or web address] Password [facility] 08/04/2016, 3:28 PM

## 2016-08-04 NOTE — Progress Notes (Signed)
   08/04/16 1500  OT Visit Information  Last OT Received On 08/04/16  Assistance Needed +2  History of Present Illness 82 y/o female with h/o hypertension, hyperlipidemia, depression, adrenal tumor, Barrett's esophagus, who presented to [facility] after sustaining a mechanical fall resulting in right great toe Fx. Found to be in new onset atrial fibrillation w/ RVR.   Precautions  Precautions Fall  Precaution Comments monitor sats  Required Braces or Orthoses Other Brace/Splint  Other Brace/Splint post op shoe on R  Pain Assessment  Faces Pain Scale 6  Pain Location all over  Pain Descriptors / Indicators Aching  Pain Intervention(s) Limited activity within patient's tolerance;Monitored during session;Repositioned;Patient requesting pain meds-RN notified  Cognition  Arousal/Alertness Awake/alert  Behavior During Therapy WFL for tasks assessed/performed  Overall Cognitive Status Within Functional Limits for tasks assessed  ADL  General ADL Comments Assisted pt back to bed with RN helping--Mod +2 assistance for sit to stand and for pivotal steps.  When back in bed, max +2 assistance to roll for hygiene as catheter had leaked.  Performed in supine as pt was anxious to get back to bed  Bed Mobility  Rolling Max assist;+2 for physical assistance  Supine to sit Max assist;+2 for physical assistance  General bed mobility comments assist for legs and trunk  Balance  Sitting balance - Comments fatiqued easily when sitting unsupported in chair, in preparation for transfer:  min guard for 40 seconds  Restrictions  Other Position/Activity Restrictions WBAT RLE with post op shoe  Transfers  Sit to Stand Mod assist;+2 physical assistance  Stand pivot transfers Mod assist;+2 safety/equipment  OT - End of Session  Activity Tolerance Patient limited by fatigue  Patient left in bed;with call bell/phone within reach;with bed alarm set;with family/visitor present (4 rails up at pt's request)  OT  Assessment/Plan  Follow Up Recommendations Supervision/Assistance - 24 hour;Home health OT (vs snf, depending on family's ability to assist her)  OT Equipment 3 in 1 bedside comode (wide)  OT Goal Progression  Progress towards OT goals Not progressing toward goals - comment (decreased performance today)  OT Time Calculation  OT Start Time (ACUTE ONLY) 1435  OT Stop Time (ACUTE ONLY) 1504  OT Time Calculation (min) 29 min  OT General Charges  $OT Visit 1 Procedure  OT Treatments  $Self Care/Home Management  8-22 mins  $Therapeutic Activity 8-22 mins  [place], OTR/L [phone] 08/04/2016

## 2016-08-04 NOTE — Progress Notes (Signed)
Physical Therapy Treatment Patient Details Name: Norma Ford MRN: [id number] DOB: May 18, 1930 Today's Date: 08/04/2016    History of Present Illness 82 y/o female with h/o hypertension, hyperlipidemia, depression, adrenal tumor, Barrett's esophagus, who presented to [facility] after sustaining a mechanical fall resulting in right great toe Fx. Found to be in new onset atrial fibrillation w/ RVR.     PT Comments    Pt c/o more fatigue.  On 2 lts O2 sats 91%.  Assisted to EOB sats decreased to 76% and HR 109 from 87 at rest.  Pt unable to sit Indep EOB due to increased c/o weakness.  Pt wanting to lean back on therapy Tech.  Assisted to recliner only with great difficulty weight shifting and completing 1/4 turn.  Reported to RN sats decreased to high 70's with activity and currently in recliner sats avg 87% with poor recovery.  RN instructed to increase O2 from 2 lts to 4.    Follow Up Recommendations  Home health PT;Supervision/Assistance - 24 hour (pt and family hopful for home D/C )     Equipment Recommendations  None recommended by PT    Recommendations for Other Services       Precautions / Restrictions Precautions Precautions: Fall Precaution Comments: monitor sats Required Braces or Orthoses: Other Brace/Splint Other Brace/Splint: post op shoe on R Restrictions Weight Bearing Restrictions: No Other Position/Activity Restrictions: WBAT RLE with post op shoe    Mobility  Bed Mobility Overal bed mobility: Needs Assistance;+2 for physical assistance;+ 2 for safety/equipment Bed Mobility: Supine to Sit     Supine to sit: Max assist;Total assist;+2 for physical assistance;+2 for safety/equipment     General bed mobility comments: required increased assist and increased c/o feeling "tired".  Only able to sit EOB x 4 min with difficulty holding self upright.  wanted to lean back and rest.    Transfers Overall transfer level: Needs assistance Equipment used: Rolling walker (2  wheeled) Transfers: Sit to/from [facility] Sit to Stand: Total assist;Max assist;+2 physical assistance;+2 safety/equipment;From elevated surface Stand pivot transfers: Max assist;Total assist;+2 physical assistance;+2 safety/equipment;From elevated surface       General transfer comment: required increased assist with difficulty completing 1/4 pivot turn from bed to recliner.  Decreased stance time on R LE and difficulty weight shifting to advance either LE.    Ambulation/Gait             General Gait Details: unable to attempt due to poor transfer ability.     Stairs            Wheelchair Mobility    Modified Rankin (Stroke Patients Only)       Balance                                    Cognition Arousal/Alertness: Awake/alert Behavior During Therapy: WFL for tasks assessed/performed Overall Cognitive Status: Within Functional Limits for tasks assessed                      Exercises      General Comments        Pertinent Vitals/Pain Pain Assessment: No/denies pain    Home Living                      Prior Function            PT Goals (current goals can  now be found in the care plan section) Progress towards PT goals: Progressing toward goals    Frequency    Min 3X/week      PT Plan Current plan remains appropriate    Co-evaluation             End of Session Equipment Utilized During Treatment: Gait belt Activity Tolerance: Patient limited by fatigue Patient left: in chair;with call bell/phone within reach;with family/visitor present     Time: [id number] PT Time Calculation (min) (ACUTE ONLY): 29 min  Charges:  $Therapeutic Activity: 23-37 mins                    G Codes:      Rica Koyanagi  PTA [facility]  Acute  Rehab Pager      [phone]

## 2016-08-04 NOTE — Progress Notes (Signed)
Spoke with pt and sister at bedside concerning [place] needs.  Pt selected [facility] Referral given to in house rep.

## 2016-08-04 NOTE — Progress Notes (Signed)
Patient Name: Norma Ford Date of Encounter: 08/04/2016  Primary Cardiologist: Joaquim Nam, MD   Hospital Problem List     Principal Problem:   Atrial fibrillation with rapid ventricular response [facility]) Active Problems:   Essential hypertension   Morbid obesity ([place])   History of fall   Frequent falls   Thigh pain   Toe fracture, right   Depression   Acute diastolic CHF     Subjective   Is not feeling well today  Has significant shortness of breath  Lots of wheezing    Inpatient Medications    . aspirin EC  325 mg Oral Daily  . azithromycin  250 mg Oral Daily  . cefUROXime  250 mg Oral BID WC  . diltiazem  30 mg Oral Q6H  . fluticasone  1 spray Each Nare Daily  . furosemide  40 mg Oral BID  . losartan  50 mg Oral Daily  . metoprolol tartrate  50 mg Oral BID  . multivitamin with minerals  1 tablet Oral Daily  . omega-3 acid ethyl esters  1 g Oral Daily  . sertraline  50 mg Oral Daily    Vital Signs    Vitals:   08/03/16 1734 08/03/16 1821 08/03/16 2200 08/04/16 0453  BP:  119/61 (!) 126/59 116/67  Pulse:  91 (!) 103 97  Resp:  20 20 20   Temp:  98.1 F (36.7 C) 98.4 F (36.9 C) 97.8 F (36.6 C)  TempSrc:  Oral Oral Oral  SpO2: 96% 94% 95% 95%  Weight:    294 lb 5 oz (133.5 kg)  Height:        Intake/Output Summary (Last 24 hours) at 08/04/16 0914 Last data filed at 08/04/16 0456  Gross per 24 hour  Intake              261 ml  Output             1290 ml  Net            -1029 ml   Filed Weights   08/02/16 0500 08/03/16 0533 08/04/16 0453  Weight: 291 lb 14.2 oz (132.4 kg) 295 lb 13.7 oz (134.2 kg) 294 lb 5 oz (133.5 kg)    Physical Exam   GEN: Well nourished, well developed, in no acute distress.  HEENT: Grossly normal.  Neck: Supple, obese, difficult to gauge JVP, no carotid bruits, or masses. Cardiac: IR, IR, no murmurs, rubs, or gallops. No clubbing, cyanosis, 1+ bilat LE edema.  Radials/DP/PT 2+ and equal bilaterally.  Respiratory:   Significant wheezing bilaterally  GI: Soft, nontender, nondistended, BS + x 4. MS: no deformity or atrophy. Skin: warm and dry, no rash. Neuro:  Strength and sensation are intact. Psych: AAOx3.  Normal affect.  Labs    CBC  Recent Labs  08/03/16 0401 08/04/16 0455  WBC 6.3 5.6  HGB 11.0* 11.6*  HCT 34.0* 36.5  MCV 99.7 100.3*  PLT 190 [id number]   Basic Metabolic Panel  Recent Labs  08/03/16 0401 08/04/16 0455  NA 138 138  K 4.1 4.2  CL 101 100*  CO2 30 32  GLUCOSE 100* 88  BUN 21* 22*  CREATININE 0.78 0.77  CALCIUM 8.5* 8.4*  MG 2.0  --    Cardiac Enzymes  Recent Labs  08/01/16 1032  TROPONINI <0.03   Fasting Lipid Panel No results for input(s): CHOL, HDL, LDLCALC, TRIG, CHOLHDL, LDLDIRECT in the last 72 hours. Thyroid Function Tests No results  for input(s): TSH, T4TOTAL, T3FREE, THYROIDAB in the last 72 hours.  Invalid input(s): FREET3  Telemetry    Afib - 80's to 90's.  Occas 1.6 sec pause.  No prolonged pauses.  Radiology    Dg Chest Port 1 View  Result Date: 08/04/2016 CLINICAL DATA:  F/u pneumonia; sob and wheezing EXAM: PORTABLE CHEST 1 VIEW COMPARISON:  08/01/2016 FINDINGS: Exam is lordotic. Extremely low lung volumes. There is increased perihilar vascular congestion. New perihilar airspace disease. No pneumothorax IMPRESSION: New perihilar airspace disease suggests pulmonary edema. Low lung volumes the Electronically Signed   By: Suzy Bouchard M.D.   On: 08/04/2016 08:28   2D Echocardiogram 9.25.2017 ------------------------------------------------------------------- Study Conclusions   - Left ventricle: The cavity size was normal. Wall thickness was   increased in a pattern of mild LVH. Systolic function was normal.   The estimated ejection fraction was in the range of 60% to 65%.  Patient Profile     82 y/o female with h/o hypertension, hyperlipidemia, depression, adrenal tumor, Barrett's esophagus, who presented to [facility] after sustaining a  mechanical fall resulting in right great toe Fx. Found to be in new onset atrial fibrillation w/ RVR.   Assessment & Plan    1.  R Great Toe Fx/Fall:  No significant pain.  Wearing post-op shoe.  Ortho has prev seen.  2.  Afib RVR:  In setting of above.  Unclear of time of onset.  Pt noted fatigue a few days prior to admission bu was not having palpitations/c/p/dyspnea.  Rate currently 100 on Lopressor 50 mg BID and Diltiazem 30 mg Q6.  Despite CHA2DS2VASc of 4, she is not on [place] 2/2 h/o multiple falls.  No plans for dccv @ this time.  3.  Essential HTN:  Stable on  blocker and dilt.  4.  Acute diastolic CHF on admission- she has diuresed 2.6L on Lasix 40 mg po BID.   5.  Morbid obesity- BMI 53. I suspect she has sleep apnea, consider sleep study at some point as an OP.   Angelena Form PA 08/04/2016, 9:14 AM    Attending Note:   The patient was seen and examined.  Agree with assessment and plan as noted above.  Changes made to the above note as needed.  Patient seen and independently examined with Kerin Ransom, PA .   We discussed all aspects of the encounter. I agree with the assessment and plan as stated above.  Pt has declined since yesterday  Has significant wheezing on exam.  Albuterol PRN was ordered. I have changed it to scheduled Xopenex  Atrial fib is under ok control She is a poor candidate for anticoagulation due to her frequent falls.     I have spent a total of 30 minutes with patient reviewing hospital  notes , telemetry, EKGs, labs and examining patient as well as establishing an assessment and plan that was discussed with the patient. > 50% of time was spent in direct patient care.    Thayer Headings, Brooke Bonito., MD, [facility] 08/04/2016, 12:29 PM [address],  [place] Pager [phone]

## 2016-08-05 LAB — CBC
HCT: 36.5 % (ref 36.0–46.0)
HEMOGLOBIN: 11.8 g/dL — AB (ref 12.0–15.0)
MCH: 32.1 pg (ref 26.0–34.0)
MCHC: 32.3 g/dL (ref 30.0–36.0)
MCV: 99.2 fL (ref 78.0–100.0)
Platelets: 242 10*3/uL (ref 150–400)
RBC: 3.68 MIL/uL — ABNORMAL LOW (ref 3.87–5.11)
RDW: 13.5 % (ref 11.5–15.5)
WBC: 4.9 10*3/uL (ref 4.0–10.5)

## 2016-08-05 LAB — BASIC METABOLIC PANEL
Anion gap: 6 (ref 5–15)
BUN: 17 mg/dL (ref 6–20)
CALCIUM: 8.5 mg/dL — AB (ref 8.9–10.3)
CHLORIDE: 100 mmol/L — AB (ref 101–111)
CO2: 32 mmol/L (ref 22–32)
Creatinine, Ser: 0.56 mg/dL (ref 0.44–1.00)
GFR calc Af Amer: >60 mL/min (ref >60)
GLUCOSE: 131 mg/dL — AB (ref 65–99)
POTASSIUM: 4.2 mmol/L (ref 3.5–5.1)
Sodium: 138 mmol/L (ref 135–145)

## 2016-08-05 LAB — CBC AND DIFFERENTIAL
HCT: 37 % (ref 36–46)
Hemoglobin: 11.8 g/dL — AB (ref 12.0–16.0)
PLATELETS: 242 10*3/uL (ref 150–399)
WBC: 4.9 10^3/mL

## 2016-08-05 LAB — GLUCOSE, CAPILLARY: GLUCOSE-CAPILLARY: 127 mg/dL — AB (ref 65–99)

## 2016-08-05 MED ORDER — FUROSEMIDE 10 MG/ML IJ SOLN: 40.0000 mg | Freq: Once | INTRAMUSCULAR | Status: DC

## 2016-08-05 MED ORDER — FUROSEMIDE 10 MG/ML IJ SOLN
40.0000 mg | Freq: Two times a day (BID) | INTRAMUSCULAR | Status: DC
  Administered 2016-08-05: 40 mg via INTRAVENOUS
  Filled 2016-08-05: qty 4

## 2016-08-05 MED ORDER — FUROSEMIDE 10 MG/ML IJ SOLN
40.0000 mg | Freq: Once | INTRAMUSCULAR | Status: AC
  Administered 2016-08-05: 40 mg via INTRAVENOUS
  Filled 2016-08-05: qty 4

## 2016-08-05 MED ORDER — METOPROLOL TARTRATE 25 MG PO TABS
25.0000 mg | ORAL_TABLET | Freq: Two times a day (BID) | ORAL | Status: DC
  Administered 2016-08-05 – 2016-08-06 (×4): 25 mg via ORAL
  Filled 2016-08-05 (×4): qty 1

## 2016-08-05 MED ORDER — DILTIAZEM HCL ER 60 MG PO CP12
120.0000 mg | ORAL_CAPSULE | Freq: Two times a day (BID) | ORAL | Status: DC
  Administered 2016-08-05 – 2016-08-10 (×11): 120 mg via ORAL
  Filled 2016-08-05 (×13): qty 2

## 2016-08-05 MED ORDER — FUROSEMIDE 10 MG/ML IJ SOLN
80.0000 mg | Freq: Two times a day (BID) | INTRAMUSCULAR | Status: AC
  Administered 2016-08-05 – 2016-08-07 (×4): 80 mg via INTRAVENOUS
  Filled 2016-08-05 (×4): qty 8

## 2016-08-05 NOTE — Evaluation (Signed)
Clinical/Bedside Swallow Evaluation Patient Details  Name: Norma Ford MRN: [id number] Date of Birth: Mar 22, 1930  Today's Date: 08/05/2016 Time: SLP Start Time (ACUTE ONLY): [id number] SLP Stop Time (ACUTE ONLY): 1620 SLP Time Calculation (min) (ACUTE ONLY): 11 min  Past Medical History:  Past Medical History:  Diagnosis Date  . Adrenal tumor 2002   right, followed by Surgery  . Barrett's esophagus 1999  . Cataracts, bilateral   . DECUBITUS ULCER, BUTTOCK 12/21/2009  . History of abnormal mammogram    2001-h/o abnormal mammo-told just calcium deposit-repeat  . HTN (hypertension)   . Hyperlipidemia   . Kidney stone   . MYCOSIS FUNGOIDES LYMPH NODES MULTIPLE SITES 09/21/2009  . THROMBOCYTOPENIA 12/21/2009   Past Surgical History:  Past Surgical History:  Procedure Laterality Date  . ABDOMINAL HYSTERECTOMY    . appendectomy    . ESOPHAGOGASTRODUODENOSCOPY  03/2002  . TONSILLECTOMY    . TOTAL KNEE ARTHROPLASTY     left 2009, right 2003  . tubal ligation     HPI:  82 y/o female with h/o hypertension, hyperlipidemia, depression, adrenal tumor, Barrett's esophagus, who presented to [facility] after sustaining a mechanical fall resulting in right great toe Fx. Found to be in new onset atrial fibrillation w/ RVR. CXR pulmonary edema.   Pt reports sensation of "lump" on left side of throat for at least six months.  Not painful; occasional coughing with POs per her dtr.    Assessment / Plan / Recommendation Clinical Impression  Pt presents with no s/s of dysphagia.  There is adequate mastication; brisk swallow response; no s/s of aspiration nor s/s of esophageal deficits.  Pt states that sensation in left throat is not painful; but is constant.  Swallow function does not appear to be impacted.  Recommend continuing current regular solid diet, thin liquids.  No SLP needs are identified - our services will sign off. Pt/dtr in agreement and will f/u with PCP if problems recur.     Aspiration Risk  No  limitations    Diet Recommendation     Medication Administration: Whole meds with liquid    Other  Recommendations   regular, thin liquids  Follow up Recommendations None      Frequency and Duration            Prognosis        Swallow Study   General Date of Onset: 08/05/16 HPI: 82 y/o female with h/o hypertension, hyperlipidemia, depression, adrenal tumor, Barrett's esophagus, who presented to [facility] after sustaining a mechanical fall resulting in right great toe Fx. Found to be in new onset atrial fibrillation w/ RVR. CXR pulmonary edema.   Pt reports sensation of "lump" on left side of throat for at least six months.  Not painful; occasional coughing with POs per her dtr.  Type of Study: Bedside Swallow Evaluation Diet Prior to this Study: Regular;Thin liquids Temperature Spikes Noted: No Respiratory Status: Nasal cannula History of Recent Intubation: No Behavior/Cognition: Alert;Cooperative;Pleasant mood Oral Cavity Assessment: Within Functional Limits Oral Care Completed by SLP: No Oral Cavity - Dentition: Dentures, top Vision: Functional for self-feeding Self-Feeding Abilities: Able to feed self Patient Positioning: Upright in chair Baseline Vocal Quality: Normal Volitional Cough: Strong Volitional Swallow: Able to elicit    Oral/Motor/Sensory Function Overall Oral Motor/Sensory Function: Within functional limits   Ice Chips Ice chips: Within functional limits   Thin Liquid Thin Liquid: Within functional limits Presentation: Cup;Straw    Nectar Thick Nectar Thick Liquid: Not tested   Honey  Thick Honey Thick Liquid: Not tested   Puree Puree: Within functional limits   Solid   GO   Solid: Within functional limits       Amberia Bayless L. Tivis Ringer, [place] CCC/SLP Pager [phone]  Juan Quam Laurice 08/05/2016,4:27 PM

## 2016-08-05 NOTE — Progress Notes (Signed)
Occupational Therapy Treatment Patient Details Name: Norma Ford MRN: [id number] DOB: [birth year] Today's Date: 08/05/2016    History of present illness 82 y/o female with h/o hypertension, hyperlipidemia, depression, adrenal tumor, Barrett's esophagus, who presented to [facility] after sustaining a mechanical fall resulting in right great toe Fx. Found to be in new onset atrial fibrillation w/ RVR.    OT comments  Pt much improved from yesterday.  Performed 3 SPTs during session   Follow Up Recommendations  SNF (vs supervision 24/7 with HHOT)    Equipment Recommendations  None recommended by OT (has BSC)    Recommendations for Other Services      Precautions / Restrictions Precautions Precautions: Fall Precaution Comments: monitor sats Required Braces or Orthoses: Other Brace/Splint Other Brace/Splint: post op shoe on R Restrictions Other Position/Activity Restrictions: WBAT RLE with post op shoe       Mobility Bed Mobility         Supine to sit: Min assist;HOB elevated        Transfers   Equipment used: Rolling walker (2 wheeled)   Sit to Stand: Min assist Stand pivot transfers: Min assist       General transfer comment: assist to rise and stabilize; steadying assistance for SPT    Balance                                   ADL                           Toilet Transfer: Minimal assistance;BSC;RW;Stand-pivot             General ADL Comments: combed hair with mod A: limited ROM in bil UES.  Pt transferred to chair then wide 3:1 commode      Vision                     Perception     Praxis      Cognition   Behavior During Therapy: [facility] for tasks assessed/performed Overall Cognitive Status: Within Functional Limits for tasks assessed                       Extremity/Trunk Assessment               Exercises     Shoulder Instructions       General Comments      Pertinent Vitals/ Pain           Home Living Family/patient expects to be discharged to:: Private residence Living Arrangements: Other relatives Available Help at Discharge: Available 24 hours/day Type of Home: House                       Home Equipment: Bedside commode   Additional Comments: sponge bathes      Prior Functioning/Environment Level of Independence: Needs assistance  Gait / Transfers Assistance Needed: amb with walker household distances; refuses to use her power chair per her caregiver ADL's / Homemaking Assistance Needed: assistance for adls from caregiver.  She can feed herself       Frequency  Min 2X/week        Progress Toward Goals  OT Goals(current goals can now be found in the care plan section)  Progress towards OT goals: Goals met and updated - see care plan  Acute Rehab OT Goals  OT Goal Formulation: With patient Time For Goal Achievement: 08/19/16 Potential to Achieve Goals: Good ADL Goals Pt Will Transfer to Toilet: with min guard assist;bedside commode;stand pivot transfer Additional ADL Goal #1: pt will go from sit to stand with min guard assistance and maintain x 2 minutes for adls Additional ADL Goal #2: pt will perform bed mobility at min guard level with HOB  Plan      Co-evaluation                 End of Session     Activity Tolerance Patient tolerated treatment well   Patient Left in chair;with call bell/phone within reach;with chair alarm set   Nurse Communication          Time: 8588-5027 OT Time Calculation (min): 37 min  Charges: OT General Charges $OT Visit: 1 Procedure OT Treatments $Self Care/Home Management : 8-22 mins $Therapeutic Activity: 8-22 mins  Ladarion Munyon 08/05/2016, 2:50 PM Lesle Chris, OTR/L [phone] 08/05/2016

## 2016-08-05 NOTE — Progress Notes (Signed)
PROGRESS NOTE  Norma Ford [id number] DOB: 28-Jul-1930 DOA: 07/31/2016 PCP: [facility]/Subjective: 82 y.o. female with medical history significant of hypertension, hyperlipidemia, depression, adrenal tumor, Barrett's esophagus, mycosis fungoides lymph nodes, morbid obesity, frequent fall, who presents with fall, right great toe pain, bilateral thigh pain. She has right great toe fracture and was found to be in Rapid Afib which is a new diagnosis for her.   She has been started on BID PO metoprolol by cardiology and still on IV diltiazem drip at 5mg /hr. Family at bedside, patient is comfortable, denies chest pain, dizziness. She developed a sensation of swollen tongue last night, but says it is stable, denies trouble swallowing, shortness of breath, pain/throat pain or stridor.  Subjective Reports subjective shortness of breath  Assessment/Plan: Atrial fibrillation with rapid ventricular response [facility]): Patient initially noted to have new onset atrial fibrillation with heart rates up to 135.  Earlier this admission, heart rates were difficult to control and patient was continued on Cardizem drip..  - Cardiology service is following and patient is now off Cardizem drip and on PO cardizem. - Will cont metoprolol BID - Patient is also continued on ASA 325 mg daily - 2d echo was unremarkable - cont to hold full anticoagulation given history of falls  Right great toe fracture and bilateral thigh pain: No evidence of hip Fx by X-ray. - Pain control: morphine prn and percocet - Continue PRN Zofran for nausea - Will continue Robaxin for muscle spasm - On 9/25, patient was seen by Dr. Sharol Given who recommended wt bearing with postop boot  HLD: Last LDL was 111/15/16. Patient was on Zocor, but not taking it currently -Patient is continued on home medications  HTN: -On Lasix and Cozaar -Patient is continued on by mouth Cardizem and by mouth metoprolol at 50 mg  twice daily -Lasix increased to IV this AM  Frequent falls:  -Suspect related to generalized deconditioning as well as morbid obesity -Plan to continue weightbearing as tolerated per orthopedic recommendations. Physical therapy, occupational consulted.  Depression and anxiety: Stable, no suicidal or homicidal ideations. -Continue home medications including zoloft  Suspected acute bronchitis versus hospital-acquired pneumonia -Recent cxr with new hilar airspace disease concern for infectious process. -currently on bronchodilators and IV steroids -Patient is continued on azithromycin and Ceftin which should cover for pneumonia.  Acutely decompensated diastolic CHF -Overnight, requiring 4LNC -Have changed PO lasix to IV lasix bid -Cardiology has further changed dose from 40mg  to 80mg   DVT ppx: SCD Code Status: Full code  Family Communication: Yes, patient's family at bed side Disposition Plan:  Anticipate she may need SNF.  Consults called: Cardiology, Orthopedics  Admission status:  SDU/inpatient  DVT Prophylaxis: SCDs in case of surgical intervention   Objective: Vitals:   08/05/16 0502 08/05/16 0503 08/05/16 0836 08/05/16 1301  BP: 115/71   (!) 114/40  Pulse: (!) 105   95  Resp: 18   17  Temp: 97.5 F (36.4 C)   97.7 F (36.5 C)  TempSrc: Oral   Oral  SpO2: 98%  95% 94%  Weight:  132.6 kg (292 lb 5.3 oz)    Height:        Intake/Output Summary (Last 24 hours) at 08/05/16 1553 Last data filed at 08/05/16 1256  Gross per 24 hour  Intake             1200 ml  Output  2225 ml  Net            -1025 ml   Filed Weights   08/03/16 0533 08/04/16 0453 08/05/16 0503  Weight: 134.2 kg (295 lb 13.7 oz) 133.5 kg (294 lb 5 oz) 132.6 kg (292 lb 5.3 oz)     Exam: General:  Alert, Awake, laying in bed Neck: Trachea midline, no masses Cardiovascular: irregularly irregular, regular rhythm Respiratory: normal resp effort, no wheezing Abdomen: Obese, positive  bowel sounds, nontender Skin: Normal skin turgor, no ecchymosis  Musculoskeletal: Perfused, no clubbing,  Psychiatric: normal mood/affect, no auditory or visual hallucinations Neurologic: Cranial nerves 2-12 grossly intact , no tremors   Data Reviewed: CBC:  Recent Labs Lab 08/01/16 0021 08/01/16 0418 08/02/16 0404 08/03/16 0401 08/04/16 0455 08/05/16 0507  WBC 6.6 6.9 6.0 6.3 5.6 4.9  NEUTROABS 4.9  --   --   --   --   --   HGB 12.1 12.4 10.8* 11.0* 11.6* 11.8*  HCT 36.9 38.0 32.6* 34.0* 36.5 36.5  MCV 98.7 98.4 98.5 99.7 100.3* 99.2  PLT 168 174 176 190 211 [id number]   Basic Metabolic Panel:  Recent Labs Lab 08/01/16 0418 08/02/16 0404 08/03/16 0401 08/04/16 0455 08/05/16 0507  NA 139 137 138 138 138  K 4.0 3.3* 4.1 4.2 4.2  CL 106 102 101 100* 100*  CO2 26 29 30  32 32  GLUCOSE 99 98 100* 88 131*  BUN 24* 15 21* 22* 17  CREATININE 0.78 0.57 0.78 0.77 0.56  CALCIUM 9.0 8.1* 8.5* 8.4* 8.5*  MG 2.1  --  2.0  --   --    GFR: Estimated Creatinine Clearance: 68.7 mL/min (by C-G formula based on SCr of 0.56 mg/dL). Liver Function Tests: No results for input(s): AST, ALT, ALKPHOS, BILITOT, PROT, ALBUMIN in the last 168 hours. No results for input(s): LIPASE, AMYLASE in the last 168 hours. No results for input(s): AMMONIA in the last 168 hours. Coagulation Profile:  Recent Labs Lab 08/01/16 0418  INR 1.02   Cardiac Enzymes:  Recent Labs Lab 08/01/16 0021 08/01/16 0418 08/01/16 1032  TROPONINI <0.03 <0.03 <0.03   BNP (last 3 results) No results for input(s): PROBNP in the last 8760 hours. HbA1C: No results for input(s): HGBA1C in the last 72 hours. CBG:  Recent Labs Lab 08/01/16 0729 08/02/16 0803 08/03/16 0810 08/04/16 0732 08/05/16 0728  GLUCAP 101* 75 81 94 127*   Lipid Profile: No results for input(s): CHOL, HDL, LDLCALC, TRIG, CHOLHDL, LDLDIRECT in the last 72 hours. Thyroid Function Tests: No results for input(s): TSH, T4TOTAL, FREET4,  T3FREE, THYROIDAB in the last 72 hours. Anemia Panel: No results for input(s): VITAMINB12, FOLATE, FERRITIN, TIBC, IRON, RETICCTPCT in the last 72 hours. Urine analysis:    Component Value Date/Time   COLORURINE AMBER (A) 08/02/2016 0934   APPEARANCEUR CLOUDY (A) 08/02/2016 0934   LABSPEC 1.023 08/02/2016 0934   PHURINE 7.5 08/02/2016 [place] 08/02/2016 0934   HGBUR NEGATIVE 08/02/2016 0934   HGBUR negative 09/23/2010 0825   BILIRUBINUR NEGATIVE 08/02/2016 0934   BILIRUBINUR Neg 02/11/2016 1150   KETONESUR NEGATIVE 08/02/2016 0934   PROTEINUR NEGATIVE 08/02/2016 0934   UROBILINOGEN 2.0 02/11/2016 1150   UROBILINOGEN negative 09/23/2010 0825   NITRITE POSITIVE (A) 08/02/2016 0934   LEUKOCYTESUR LARGE (A) 08/02/2016 0934   Sepsis Labs: @LABRCNTIP (procalcitonin:4,lacticidven:4)  ) Recent Results (from the past 240 hour(s))  MRSA PCR Screening     Status: None   Collection Time: 08/01/16  4:53 AM  Result Value Ref Range Status   MRSA by PCR NEGATIVE NEGATIVE Final    Comment:        The GeneXpert MRSA Assay (FDA approved for NASAL specimens only), is one component of a comprehensive MRSA colonization surveillance program. It is not intended to diagnose MRSA infection nor to guide or monitor treatment for MRSA infections.      Studies: No results found.  Scheduled Meds: . aspirin EC  325 mg Oral Daily  . azithromycin  250 mg Oral Daily  . cefUROXime  250 mg Oral BID WC  . diltiazem  120 mg Oral Q12H  . fluticasone  1 spray Each Nare Daily  . furosemide  80 mg Intravenous BID  . levalbuterol  0.63 mg Nebulization TID  . losartan  50 mg Oral Daily  . methylPREDNISolone (SOLU-MEDROL) injection  40 mg Intravenous Q12H  . metoprolol tartrate  25 mg Oral BID  . multivitamin with minerals  1 tablet Oral Daily  . omega-3 acid ethyl esters  1 g Oral Daily  . sertraline  50 mg Oral Daily    Continuous Infusions:     LOS: 4 days    Zariah Cavendish, Orpah Melter, MD [facility] Pager [phone] If 7PM-7AM, please contact night-coverage [email or web address] Password [facility] 08/05/2016, 3:53 PM

## 2016-08-05 NOTE — Progress Notes (Signed)
Patient Name: Norma Ford Date of Encounter: 08/05/2016  Primary Cardiologist: Joaquim Nam, MD   Hospital Problem List     Principal Problem:   Atrial fibrillation with rapid ventricular response [facility]) Active Problems:   Essential hypertension   Morbid obesity ([place])   History of fall   Frequent falls   Thigh pain   Toe fracture, right   Depression   Acute diastolic CHF     Subjective   Pt reports she is feeling better though still wheezing- getting a breathing Rx now.    Inpatient Medications    . aspirin EC  325 mg Oral Daily  . azithromycin  250 mg Oral Daily  . cefUROXime  250 mg Oral BID WC  . diltiazem  30 mg Oral Q6H  . fluticasone  1 spray Each Nare Daily  . furosemide  40 mg Intravenous BID  . levalbuterol  0.63 mg Nebulization TID  . losartan  50 mg Oral Daily  . methylPREDNISolone (SOLU-MEDROL) injection  40 mg Intravenous Q12H  . metoprolol tartrate  50 mg Oral BID  . multivitamin with minerals  1 tablet Oral Daily  . omega-3 acid ethyl esters  1 g Oral Daily  . sertraline  50 mg Oral Daily    Vital Signs    Vitals:   08/04/16 2355 08/05/16 0502 08/05/16 0503 08/05/16 0836  BP: 115/70 115/71    Pulse: (!) 111 (!) 105    Resp:  18    Temp:  97.5 F (36.4 C)    TempSrc:  Oral    SpO2:  98%  95%  Weight:   292 lb 5.3 oz (132.6 kg)   Height:        Intake/Output Summary (Last 24 hours) at 08/05/16 0844 Last data filed at 08/05/16 0507  Gross per 24 hour  Intake             1080 ml  Output             2225 ml  Net            -1145 ml   Filed Weights   08/03/16 0533 08/04/16 0453 08/05/16 0503  Weight: 295 lb 13.7 oz (134.2 kg) 294 lb 5 oz (133.5 kg) 292 lb 5.3 oz (132.6 kg)    Physical Exam   GEN: Well nourished, well developed, in no acute distress.  HEENT: Grossly normal.  Neck: Supple, obese, difficult to gauge JVP, no carotid bruits, or masses. Cardiac: IR, IR, no murmurs, rubs, or gallops. No clubbing, cyanosis, 1+ bilat LE  edema. Respiratory:  Significant wheezing bilaterally  GI: Obese, soft, nontender, nondistended, BS + x 4. MS: no deformity or atrophy. Skin: warm and dry, no rash. Neuro:  Strength and sensation are intact. Psych: AAOx3.  Normal affect.  Labs    CBC  Recent Labs  08/04/16 0455 08/05/16 0507  WBC 5.6 4.9  HGB 11.6* 11.8*  HCT 36.5 36.5  MCV 100.3* 99.2  PLT 211 [id number]   Basic Metabolic Panel  Recent Labs  08/03/16 0401 08/04/16 0455 08/05/16 0507  NA 138 138 138  K 4.1 4.2 4.2  CL 101 100* 100*  CO2 30 32 32  GLUCOSE 100* 88 131*  BUN 21* 22* 17  CREATININE 0.78 0.77 0.56  CALCIUM 8.5* 8.4* 8.5*  MG 2.0  --   --     Telemetry    Afib - 80's to 90's.  Occas 1.6 sec pause.  No prolonged pauses.  Radiology    Dg Chest Port 1 View  Result Date: 08/04/2016 CLINICAL DATA:  F/u pneumonia; sob and wheezing EXAM: PORTABLE CHEST 1 VIEW COMPARISON:  08/01/2016 FINDINGS: Exam is lordotic. Extremely low lung volumes. There is increased perihilar vascular congestion. New perihilar airspace disease. No pneumothorax IMPRESSION: New perihilar airspace disease suggests pulmonary edema. Low lung volumes the Electronically Signed   By: Suzy Bouchard M.D.   On: 08/04/2016 08:28   2D Echocardiogram 9.25.2017 ------------------------------------------------------------------- Study Conclusions   - Left ventricle: The cavity size was normal. Wall thickness was   increased in a pattern of mild LVH. Systolic function was normal.   The estimated ejection fraction was in the range of 60% to 65%.  Patient Profile     82 y/o morbidly obese female, sedentary secondary to DJD, with h/o hypertension, falls, adrenal tumor, and Barrett's esophagus, who presented to [facility] 07/31/16 after sustaining a mechanical fall resulting in right great toe Fx. Found to be in new onset atrial fibrillation w/ RVR. She also has suspected combined pneumonia and diastolic CHF.   Assessment & Plan    1.  R  Great Toe Fx/Fall:  No significant pain.  Wearing post-op shoe.  Ortho has prev seen.  2.  Afib RVR:  In setting of above.  Unclear of time of onset.  Pt noted fatigue a few days prior to admission bu was not having palpitations/c/p/dyspnea.  Rate currently 100 on Lopressor 50 mg BID and Diltiazem 30 mg Q6.  Despite CHA2DS2VASc of 4, she is not on [place] 2/2 h/o multiple falls.  No plans for dccv @ this time.  3.  Essential HTN:  Stable on  blocker and dilt.  4.  Acute diastolic CHF on admission- she has diuresed 2.6L on Lasix 40 mg po BID. Need further diuresis.   5.  Morbid obesity- BMI 53. I suspect she has sleep apnea, consider sleep study at some point as an OP.   Plan: Though her rate is better on Lopressor 50 mg BID she has significant wheezing on exam. It's not clear how much of this is from CHF vs URI but I suspect she needs further diuresis.  Will decrease Lopressor to 25 mg BID and increase Lasix to 80 mg BID for a few doses (4 doses) then re-evaluate. Her B/P is stable, change Diltiazem to SR 120 mg BID. Hold Cozaar for systolic B/P less than [id number].  MD to see.   Angelena Form PA 08/05/2016, 8:44 AM   Attending Note:   The patient was seen and examined.  Agree with assessment and plan as noted above.  Changes made to the above note as needed.  Patient seen and independently examined with Kerin Ransom, PA .   We discussed all aspects of the encounter. I agree with the assessment and plan as stated above.  Pt is improving. Afib with RVR - the rate is slowly improving We have added Diltiazem to the metoprolol I did not hear any wheezing on exam today  Continue rehab. Hopefully home soon     I have spent a total of 40 minutes with patient reviewing hospital  notes , telemetry, EKGs, labs and examining patient as well as establishing an assessment and plan that was discussed with the patient. > 50% of time was spent in direct patient care.    Thayer Headings, Brooke Bonito., MD,  [facility] 08/05/2016, 12:20 PM [address],  [place] Pager [phone]

## 2016-08-06 LAB — CBC
HEMATOCRIT: 35.8 % — AB (ref 36.0–46.0)
Hemoglobin: 12.7 g/dL (ref 12.0–15.0)
MCH: 33.8 pg (ref 26.0–34.0)
MCHC: 35.5 g/dL (ref 30.0–36.0)
MCV: 95.2 fL (ref 78.0–100.0)
Platelets: 287 10*3/uL (ref 150–400)
RBC: 3.76 MIL/uL — AB (ref 3.87–5.11)
RDW: 13.2 % (ref 11.5–15.5)
WBC: 12 10*3/uL — AB (ref 4.0–10.5)

## 2016-08-06 LAB — BASIC METABOLIC PANEL
ANION GAP: 5 (ref 5–15)
BUN: 22 mg/dL — AB (ref 4–21)
BUN: 22 mg/dL — ABNORMAL HIGH (ref 6–20)
CHLORIDE: 100 mmol/L — AB (ref 101–111)
CO2: 33 mmol/L — ABNORMAL HIGH (ref 22–32)
Calcium: 8.5 mg/dL — ABNORMAL LOW (ref 8.9–10.3)
Creatinine, Ser: 0.64 mg/dL (ref 0.44–1.00)
Creatinine: 0.6 mg/dL (ref 0.5–1.1)
GFR calc non Af Amer: >60 mL/min (ref >60)
GLUCOSE: 140 mg/dL
Glucose, Bld: 140 mg/dL — ABNORMAL HIGH (ref 65–99)
POTASSIUM: 3.6 mmol/L (ref 3.4–5.3)
POTASSIUM: 3.6 mmol/L (ref 3.5–5.1)
SODIUM: 138 mmol/L (ref 137–147)
SODIUM: 138 mmol/L (ref 135–145)

## 2016-08-06 LAB — CBC AND DIFFERENTIAL
HEMATOCRIT: 36 % (ref 36–46)
HEMOGLOBIN: 12.7 g/dL (ref 12.0–16.0)
Platelets: 287 10*3/uL (ref 150–399)
WBC: 12 10^3/mL

## 2016-08-06 LAB — GLUCOSE, CAPILLARY: Glucose-Capillary: 145 mg/dL — ABNORMAL HIGH (ref 65–99)

## 2016-08-06 MED ORDER — LEVALBUTEROL HCL 0.63 MG/3ML IN NEBU: 0.6300 mg | INHALATION_SOLUTION | RESPIRATORY_TRACT | Status: DC | PRN

## 2016-08-06 NOTE — Progress Notes (Signed)
PROGRESS NOTE  Norma Ford [id number] DOB: Aug 14, 1930 DOA: 07/31/2016 PCP: [facility]/Subjective: 82 y.o. female with medical history significant of hypertension, hyperlipidemia, depression, adrenal tumor, Barrett's esophagus, mycosis fungoides lymph nodes, morbid obesity, frequent fall, who presents with fall, right great toe pain, bilateral thigh pain. She has right great toe fracture and was found to be in Rapid Afib which is a new diagnosis for her.   She has been started on BID PO metoprolol by cardiology and still on IV diltiazem drip at 5mg /hr. Family at bedside, patient is comfortable, denies chest pain, dizziness. She developed a sensation of swollen tongue last night, but says it is stable, denies trouble swallowing, shortness of breath, pain/throat pain or stridor.  Subjective Patient reports shortness of breath seemed better today  Assessment/Plan: Atrial fibrillation with rapid ventricular response [facility]): Patient initially noted to have new onset atrial fibrillation with heart rates up to 135.  Earlier this admission, heart rates were difficult to control and patient was continued on Cardizem drip..  - Cardiology service continues to follow. -Patient remains on by mouth Cardizem - Will cont metoprolol BID as tolerated - Patient is also continued on ASA 325 mg daily - 2d echo was unremarkable - Holding full anticoagulation given history of falls  Right great toe fracture and bilateral thigh pain: No evidence of hip Fx by X-ray. - Pain control: Continue morphine prn and percocet - Continue PRN Zofran for nausea - Will continue Robaxin for muscle spasm - On 9/25, patient was seen by Dr. Sharol Given who recommended weightbearing with postop boot  HLD: Last LDL was 111/15/16. Patient was on Zocor, but had not been taking -Patient is continued on home medications for now  HTN: -On Lasix and Cozaar as tolerated -Patient is continued on by  mouth Cardizem and by mouth metoprolol at 50 mg twice daily -Continued on 80 mg twice a day IV Lasix  Frequent falls:  -Suspect related to generalized deconditioning as well as morbid obesity -Plan to continue weightbearing as tolerated per orthopedic recommendations. Physical therapy, occupational consulted.  Depression and anxiety: Stable, no suicidal or homicidal ideations. -Continue home medications including zoloft -Not feeling depressed currently  Suspected acute bronchitis versus hospital-acquired pneumonia -Recent cxr with new hilar airspace disease concern for infectious process. -currently on bronchodilators and IV steroids -Patient is continued on azithromycin and Ceftin which should cover for pneumonia. -Chart reviewed, vital signs have remained stable overnight. Patient remains afebrile  Acutely decompensated diastolic CHF -Patient continues to require 4 L nasal cannula -Patient remains on 80 mg IV twice a day Lasix -Continue to wean O2 as tolerated  DVT ppx: SCD Code Status: Full code  Family Communication: Yes, patient's family at bed side Disposition Plan:   plan skilled nursing facility at time of discharge.  Consults called: Cardiology, Orthopedics  Admission status:  SDU/inpatient  DVT Prophylaxis: SCDs in case of surgical intervention   Objective: Vitals:   08/05/16 1955 08/05/16 2020 08/06/16 0447 08/06/16 1430  BP:  (!) 103/55 (!) 105/58 (!) 102/42  Pulse:  98 93 85  Resp:  16 16 18   Temp:  97.9 F (36.6 C) 98.4 F (36.9 C) 98.5 F (36.9 C)  TempSrc:  Oral Oral Oral  SpO2: 95% 94% 96% 93%  Weight:   130.5 kg (287 lb 11.2 oz)   Height:        Intake/Output Summary (Last 24 hours) at 08/06/16 1641 Last data filed at 08/06/16 1556  Gross per 24 hour  Intake              240 ml  Output             2900 ml  Net            -2660 ml   Filed Weights   08/04/16 0453 08/05/16 0503 08/06/16 0447  Weight: 133.5 kg (294 lb 5 oz) 132.6 kg (292 lb  5.3 oz) 130.5 kg (287 lb 11.2 oz)     Exam: General:  Conversant, in no acute distress Neck: Neck supple, thyroid is not appear enlarged Cardiovascular: irregularly irregular, S1-S2 Respiratory: Notable wheezing, good chest rise Abdomen: Morbidly obese, no organomegaly Skin: No abnormal skin lesions seen, no pallor Musculoskeletal: No cyanosis, no joint deformities Psychiatric: Affect normal, no visual hallucinations Neurologic: No seizures, sensation intact   Data Reviewed: CBC:  Recent Labs Lab 08/01/16 0021  08/02/16 0404 08/03/16 0401 08/04/16 0455 08/05/16 0507 08/06/16 0508  WBC 6.6  < > 6.0 6.3 5.6 4.9 12.0*  NEUTROABS 4.9  --   --   --   --   --   --   HGB 12.1  < > 10.8* 11.0* 11.6* 11.8* 12.7  HCT 36.9  < > 32.6* 34.0* 36.5 36.5 35.8*  MCV 98.7  < > 98.5 99.7 100.3* 99.2 95.2  PLT 168  < > 176 190 211 242 287  < > = values in this interval not displayed. Basic Metabolic Panel:  Recent Labs Lab 08/01/16 0418 08/02/16 0404 08/03/16 0401 08/04/16 0455 08/05/16 0507 08/06/16 0508  NA 139 137 138 138 138 138  K 4.0 3.3* 4.1 4.2 4.2 3.6  CL 106 102 101 100* 100* 100*  CO2 26 29 30  32 32 33*  GLUCOSE 99 98 100* 88 131* 140*  BUN 24* 15 21* 22* 17 22*  CREATININE 0.78 0.57 0.78 0.77 0.56 0.64  CALCIUM 9.0 8.1* 8.5* 8.4* 8.5* 8.5*  MG 2.1  --  2.0  --   --   --    GFR: Estimated Creatinine Clearance: 68 mL/min (by C-G formula based on SCr of 0.64 mg/dL). Liver Function Tests: No results for input(s): AST, ALT, ALKPHOS, BILITOT, PROT, ALBUMIN in the last 168 hours. No results for input(s): LIPASE, AMYLASE in the last 168 hours. No results for input(s): AMMONIA in the last 168 hours. Coagulation Profile:  Recent Labs Lab 08/01/16 0418  INR 1.02   Cardiac Enzymes:  Recent Labs Lab 08/01/16 0021 08/01/16 0418 08/01/16 1032  TROPONINI <0.03 <0.03 <0.03   BNP (last 3 results) No results for input(s): PROBNP in the last 8760 hours. HbA1C: No  results for input(s): HGBA1C in the last 72 hours. CBG:  Recent Labs Lab 08/02/16 0803 08/03/16 0810 08/04/16 0732 08/05/16 0728 08/06/16 0820  GLUCAP 75 81 94 127* 145*   Lipid Profile: No results for input(s): CHOL, HDL, LDLCALC, TRIG, CHOLHDL, LDLDIRECT in the last 72 hours. Thyroid Function Tests: No results for input(s): TSH, T4TOTAL, FREET4, T3FREE, THYROIDAB in the last 72 hours. Anemia Panel: No results for input(s): VITAMINB12, FOLATE, FERRITIN, TIBC, IRON, RETICCTPCT in the last 72 hours. Urine analysis:    Component Value Date/Time   COLORURINE AMBER (A) 08/02/2016 0934   APPEARANCEUR CLOUDY (A) 08/02/2016 0934   LABSPEC 1.023 08/02/2016 [place] 7.5 08/02/2016 [place] 08/02/2016 0934   HGBUR NEGATIVE 08/02/2016 0934   HGBUR negative 09/23/2010 0825   BILIRUBINUR NEGATIVE 08/02/2016 0934  BILIRUBINUR Neg 02/11/2016 1150   KETONESUR NEGATIVE 08/02/2016 0934   PROTEINUR NEGATIVE 08/02/2016 0934   UROBILINOGEN 2.0 02/11/2016 1150   UROBILINOGEN negative 09/23/2010 0825   NITRITE POSITIVE (A) 08/02/2016 0934   LEUKOCYTESUR LARGE (A) 08/02/2016 0934   Sepsis Labs: @LABRCNTIP (procalcitonin:4,lacticidven:4)  ) Recent Results (from the past 240 hour(s))  MRSA PCR Screening     Status: None   Collection Time: 08/01/16  4:53 AM  Result Value Ref Range Status   MRSA by PCR NEGATIVE NEGATIVE Final    Comment:        The GeneXpert MRSA Assay (FDA approved for NASAL specimens only), is one component of a comprehensive MRSA colonization surveillance program. It is not intended to diagnose MRSA infection nor to guide or monitor treatment for MRSA infections.      Studies: No results found.  Scheduled Meds: . aspirin EC  325 mg Oral Daily  . azithromycin  250 mg Oral Daily  . cefUROXime  250 mg Oral BID WC  . diltiazem  120 mg Oral Q12H  . fluticasone  1 spray Each Nare Daily  . furosemide  80 mg Intravenous BID  . losartan  50 mg  Oral Daily  . methylPREDNISolone (SOLU-MEDROL) injection  40 mg Intravenous Q12H  . metoprolol tartrate  25 mg Oral BID  . multivitamin with minerals  1 tablet Oral Daily  . omega-3 acid ethyl esters  1 g Oral Daily  . sertraline  50 mg Oral Daily    Continuous Infusions:     LOS: 5 days    CHIU, Orpah Melter, MD [facility] Pager [phone] If 7PM-7AM, please contact night-coverage [email or web address] Password [facility] 08/06/2016, 4:41 PM

## 2016-08-06 NOTE — Progress Notes (Signed)
Patient Name: Norma Ford Date of Encounter: 08/06/2016  Primary Cardiologist: Joaquim Nam, MD   Hospital Problem List     Principal Problem:   Atrial fibrillation with rapid ventricular response [facility]) Active Problems:   Essential hypertension   Morbid obesity ([place])   History of fall   Frequent falls   Thigh pain   Toe fracture, right   Depression   Acute diastolic CHF     Subjective   Pt reports she is feeling better though still wheezing- getting a breathing Rx now.    Inpatient Medications    . aspirin EC  325 mg Oral Daily  . azithromycin  250 mg Oral Daily  . cefUROXime  250 mg Oral BID WC  . diltiazem  120 mg Oral Q12H  . fluticasone  1 spray Each Nare Daily  . furosemide  80 mg Intravenous BID  . levalbuterol  0.63 mg Nebulization TID  . losartan  50 mg Oral Daily  . methylPREDNISolone (SOLU-MEDROL) injection  40 mg Intravenous Q12H  . metoprolol tartrate  25 mg Oral BID  . multivitamin with minerals  1 tablet Oral Daily  . omega-3 acid ethyl esters  1 g Oral Daily  . sertraline  50 mg Oral Daily    Vital Signs    Vitals:   08/05/16 1301 08/05/16 1955 08/05/16 2020 08/06/16 0447  BP: (!) 114/40  (!) 103/55 (!) 105/58  Pulse: 95  98 93  Resp: 17  16 16   Temp: 97.7 F (36.5 C)  97.9 F (36.6 C) 98.4 F (36.9 C)  TempSrc: Oral  Oral Oral  SpO2: 94% 95% 94% 96%  Weight:    287 lb 11.2 oz (130.5 kg)  Height:        Intake/Output Summary (Last 24 hours) at 08/06/16 0750 Last data filed at 08/06/16 0452  Gross per 24 hour  Intake              720 ml  Output             2625 ml  Net            -1905 ml   Filed Weights   08/04/16 0453 08/05/16 0503 08/06/16 0447  Weight: 294 lb 5 oz (133.5 kg) 292 lb 5.3 oz (132.6 kg) 287 lb 11.2 oz (130.5 kg)    Physical Exam   GEN: Well nourished, well developed, in no acute distress.  HEENT: Grossly normal.  Neck: Supple, obese, difficult to gauge JVP, no carotid bruits, or masses. Cardiac: IR, IR, no  murmurs, rubs, or gallops. No clubbing, cyanosis, 1+ bilat LE edema. Respiratory:  Mild wheeze in right base, gradually improving  GI: Obese, soft, nontender, nondistended, BS + x 4. MS: no deformity or atrophy. Skin: warm and dry, no rash. Neuro:  Strength and sensation are intact. Psych: AAOx3.  Normal affect.  Labs    CBC  Recent Labs  08/05/16 0507 08/06/16 0508  WBC 4.9 12.0*  HGB 11.8* 12.7  HCT 36.5 35.8*  MCV 99.2 95.2  PLT 242 [id number]   Basic Metabolic Panel  Recent Labs  08/05/16 0507 08/06/16 0508  NA 138 138  K 4.2 3.6  CL 100* 100*  CO2 32 33*  GLUCOSE 131* 140*  BUN 17 22*  CREATININE 0.56 0.64  CALCIUM 8.5* 8.5*    Telemetry    Afib - 80's to 90's.  Occas 1.6 sec pause.  No prolonged pauses.  Radiology    Dg Chest  Port 1 View  Result Date: 08/04/2016 CLINICAL DATA:  F/u pneumonia; sob and wheezing EXAM: PORTABLE CHEST 1 VIEW COMPARISON:  08/01/2016 FINDINGS: Exam is lordotic. Extremely low lung volumes. There is increased perihilar vascular congestion. New perihilar airspace disease. No pneumothorax IMPRESSION: New perihilar airspace disease suggests pulmonary edema. Low lung volumes the Electronically Signed   By: Suzy Bouchard M.D.   On: 08/04/2016 08:28   2D Echocardiogram 9.25.2017 ------------------------------------------------------------------- Study Conclusions   - Left ventricle: The cavity size was normal. Wall thickness was   increased in a pattern of mild LVH. Systolic function was normal.   The estimated ejection fraction was in the range of 60% to 65%.  Patient Profile     82 y/o morbidly obese female, sedentary secondary to DJD, with h/o hypertension, falls, adrenal tumor, and Barrett's esophagus, who presented to [facility] 07/31/16 after sustaining a mechanical fall resulting in right great toe Fx. Found to be in new onset atrial fibrillation w/ RVR. She also has suspected combined pneumonia and diastolic CHF.   Assessment & Plan      1.  R Great Toe Fx/Fall:  No significant pain.  Wearing post-op shoe.  Ortho has prev seen.  2.  Afib RVR:  In setting of above.  Unclear of time of onset.  Pt noted fatigue a few days prior to admission bu was not having palpitations/c/p/dyspnea.    Despite CHA2DS2VASc of 4, she is not on [place] 2/2 h/o multiple falls.  No plans for dccv @ this time. HR is improving   3.  Essential HTN:  Stable on  blocker and dilt.  4.  Acute diastolic CHF on admission- she has diuresed 5.7 L on Lasix 80 mg  IV  BID   5.  Morbid obesity- BMI 53. I suspect she has sleep apnea, consider sleep study at some point as an OP.      Mertie Moores, MD  08/06/2016 8:06 AM    [address] [place],  [place], [place]  [address] Pager [phone] Phone: [phone]; Fax: [phone]

## 2016-08-07 DIAGNOSIS — I5031 Acute diastolic (congestive) heart failure: Secondary | ICD-10-CM

## 2016-08-07 DIAGNOSIS — R296 Repeated falls: Secondary | ICD-10-CM

## 2016-08-07 LAB — BASIC METABOLIC PANEL
Anion gap: 7 (ref 5–15)
BUN: 25 mg/dL — ABNORMAL HIGH (ref 6–20)
BUN: 25 mg/dL — AB (ref 4–21)
CO2: 35 mmol/L — ABNORMAL HIGH (ref 22–32)
CREATININE: 0.6 mg/dL (ref 0.5–1.1)
Calcium: 8.4 mg/dL — ABNORMAL LOW (ref 8.9–10.3)
Chloride: 97 mmol/L — ABNORMAL LOW (ref 101–111)
Creatinine, Ser: 0.6 mg/dL (ref 0.44–1.00)
GFR calc Af Amer: >60 mL/min (ref >60)
GFR calc non Af Amer: >60 mL/min (ref >60)
Glucose, Bld: 124 mg/dL — ABNORMAL HIGH (ref 65–99)
Glucose: 124 mg/dL
POTASSIUM: 3.7 mmol/L (ref 3.4–5.3)
Potassium: 3.7 mmol/L (ref 3.5–5.1)
SODIUM: 139 mmol/L (ref 137–147)
Sodium: 139 mmol/L (ref 135–145)

## 2016-08-07 LAB — CBC
HCT: 38.3 % (ref 36.0–46.0)
HEMOGLOBIN: 12.4 g/dL (ref 12.0–15.0)
MCH: 31.8 pg (ref 26.0–34.0)
MCHC: 32.4 g/dL (ref 30.0–36.0)
MCV: 98.2 fL (ref 78.0–100.0)
PLATELETS: 313 10*3/uL (ref 150–400)
RBC: 3.9 MIL/uL (ref 3.87–5.11)
RDW: 13.6 % (ref 11.5–15.5)
WBC: 13 10*3/uL — AB (ref 4.0–10.5)

## 2016-08-07 LAB — CBC AND DIFFERENTIAL: WBC: 13 10*3/mL

## 2016-08-07 LAB — GLUCOSE, CAPILLARY: GLUCOSE-CAPILLARY: 135 mg/dL — AB (ref 65–99)

## 2016-08-07 MED ORDER — DIGOXIN 125 MCG PO TABS
0.1250 mg | ORAL_TABLET | Freq: Every day | ORAL | Status: DC
  Administered 2016-08-07 – 2016-08-09 (×3): 0.125 mg via ORAL
  Filled 2016-08-07 (×5): qty 1

## 2016-08-07 MED ORDER — METOPROLOL TARTRATE 50 MG PO TABS
50.0000 mg | ORAL_TABLET | Freq: Two times a day (BID) | ORAL | Status: DC
  Administered 2016-08-07 – 2016-08-10 (×7): 50 mg via ORAL
  Filled 2016-08-07 (×7): qty 1

## 2016-08-07 NOTE — Progress Notes (Signed)
PROGRESS NOTE  Norma Ford [id number] DOB: September 03, 1930 DOA: 07/31/2016 PCP: [facility] Hospital Course/Subjective: 82 y.o. female with medical history significant of hypertension, hyperlipidemia, depression, adrenal tumor, Barrett's esophagus, mycosis fungoides lymph nodes, morbid obesity, frequent fall, who presents with fall, right great toe pain, bilateral thigh pain. She has right great toe fracture and was found to be in Rapid Afib which is a new diagnosis for her.   She has been started on BID PO metoprolol by cardiology and still on IV diltiazem drip at 5mg /hr. Family at bedside, patient is comfortable, denies chest pain, dizziness. She developed a sensation of swollen tongue last night, but says it is stable, denies trouble swallowing, shortness of breath, pain/throat pain or stridor.  Subjective Patient reports shortness of breath seemed better today  Assessment/Plan: Atrial fibrillation with rapid ventricular response [facility]): Patient initially noted to have new onset atrial fibrillation with heart rates up to 135.  Earlier this admission, heart rates were difficult to control and patient required Cardizem drip. - Cardiology service following -HR more elevated this AM -On PO Cardizem - Increased metoprolol to 50mg  BID as tolerated -digoxin added per Cardiology - Patient is also continued on ASA 325 mg daily - 2d echo was unremarkable - Holding full anticoagulation given history of falls  Right great toe fracture and bilateral thigh pain: No evidence of hip Fx by X-ray. - Pain control: Continue morphine prn and percocet - Continue PRN Zofran for nausea - Will continue Robaxin for muscle spasm - On 9/25, patient was seen by Dr. Sharol Given who recommended weightbearing with postop boot  HLD: Last LDL was 111/15/16. Patient was on Zocor, but had not been taking -Patient is continued on home medications as tolerated  HTN: -continues on Lasix and Cozaar as  tolerated -Patient is continued on PO Cardizem and PO metoprolol at 50 mg twice daily -Continued on 80 mg twice a day IV Lasix  Frequent falls:  -Suspect related to generalized deconditioning as well as morbid obesity -Plan to continue weightbearing as tolerated per orthopedic recommendations.  -Therapy recs for SNF  Depression and anxiety: Remains stable, no suicidal or homicidal ideations. -Continued on home medications including zoloft  Suspected acute bronchitis versus hospital-acquired pneumonia -Recent cxr with new hilar airspace disease concern for infectious process. -currently on bronchodilators and IV steroids -Patient is continued on empiric azithromycin and Ceftin which should cover for pneumonia. -Continue to wean O2  Acutely decompensated diastolic CHF -O2 requirements down to 2.5L this AM -Patient observed sleeping this AM. She is noted to be a heavy snorer. Likely has undiagnosed OSA, which may result in nocturnal hypoxia -Recommend outpatient sleep study -Lasix now on hold  Acute hypoxia -Patient is O2 naive prior to admission -continue to wean O2 as tolerated -recommend sleep study  DVT ppx: SCD Code Status: Full code  Family Communication: Yes, patient's family at bed side Disposition Plan:   plan skilled nursing facility at time of discharge.  Consults called: Cardiology, Orthopedics  Admission status:  SDU/inpatient  DVT Prophylaxis: SCDs in case of surgical intervention   Objective: Vitals:   08/07/16 0333 08/07/16 0428 08/07/16 1029 08/07/16 1439  BP:  125/72  118/70  Pulse:  81 78 90  Resp:  18  19  Temp:  97.7 F (36.5 C)  98 F (36.7 C)  TempSrc:  Oral  Oral  SpO2: 90% 95%  91%  Weight:      Height:  Intake/Output Summary (Last 24 hours) at 08/07/16 1746 Last data filed at 08/07/16 1300  Gross per 24 hour  Intake              240 ml  Output             3300 ml  Net            -3060 ml   Filed Weights   08/04/16 0453  08/05/16 0503 08/06/16 0447  Weight: 133.5 kg (294 lb 5 oz) 132.6 kg (292 lb 5.3 oz) 130.5 kg (287 lb 11.2 oz)     Exam: General: Laying in bed, NAD Neck: trachea midline, neck supple Cardiovascular: irregular rhythm, s1, s2, tachycardic Respiratory: CTAB, no audible wheezing Abdomen: Nontender, nondistended Skin: Normal skin turgor, no rashes Musculoskeletal: No clubbing, perfused Psychiatric: Mood normal, no auditory hallucinations Neurologic: CN2-12 grossly intact, UE strength intact   Data Reviewed: CBC:  Recent Labs Lab 08/01/16 0021  08/03/16 0401 08/04/16 0455 08/05/16 0507 08/06/16 0508 08/07/16 0511  WBC 6.6  < > 6.3 5.6 4.9 12.0* 13.0*  NEUTROABS 4.9  --   --   --   --   --   --   HGB 12.1  < > 11.0* 11.6* 11.8* 12.7 12.4  HCT 36.9  < > 34.0* 36.5 36.5 35.8* 38.3  MCV 98.7  < > 99.7 100.3* 99.2 95.2 98.2  PLT 168  < > 190 211 242 287 313  < > = values in this interval not displayed. Basic Metabolic Panel:  Recent Labs Lab 08/01/16 0418  08/03/16 0401 08/04/16 0455 08/05/16 0507 08/06/16 0508 08/07/16 0511  NA 139  < > 138 138 138 138 139  K 4.0  < > 4.1 4.2 4.2 3.6 3.7  CL 106  < > 101 100* 100* 100* 97*  CO2 26  < > 30 32 32 33* 35*  GLUCOSE 99  < > 100* 88 131* 140* 124*  BUN 24*  < > 21* 22* 17 22* 25*  CREATININE 0.78  < > 0.78 0.77 0.56 0.64 0.60  CALCIUM 9.0  < > 8.5* 8.4* 8.5* 8.5* 8.4*  MG 2.1  --  2.0  --   --   --   --   < > = values in this interval not displayed. GFR: Estimated Creatinine Clearance: 68 mL/min (by C-G formula based on SCr of 0.6 mg/dL). Liver Function Tests: No results for input(s): AST, ALT, ALKPHOS, BILITOT, PROT, ALBUMIN in the last 168 hours. No results for input(s): LIPASE, AMYLASE in the last 168 hours. No results for input(s): AMMONIA in the last 168 hours. Coagulation Profile:  Recent Labs Lab 08/01/16 0418  INR 1.02   Cardiac Enzymes:  Recent Labs Lab 08/01/16 0021 08/01/16 0418 08/01/16 1032    TROPONINI <0.03 <0.03 <0.03   BNP (last 3 results) No results for input(s): PROBNP in the last 8760 hours. HbA1C: No results for input(s): HGBA1C in the last 72 hours. CBG:  Recent Labs Lab 08/03/16 0810 08/04/16 0732 08/05/16 0728 08/06/16 0820 08/07/16 0727  GLUCAP 81 94 127* 145* 135*   Lipid Profile: No results for input(s): CHOL, HDL, LDLCALC, TRIG, CHOLHDL, LDLDIRECT in the last 72 hours. Thyroid Function Tests: No results for input(s): TSH, T4TOTAL, FREET4, T3FREE, THYROIDAB in the last 72 hours. Anemia Panel: No results for input(s): VITAMINB12, FOLATE, FERRITIN, TIBC, IRON, RETICCTPCT in the last 72 hours. Urine analysis:    Component Value Date/Time   COLORURINE AMBER (A) 08/02/2016 [id number]  APPEARANCEUR CLOUDY (A) 08/02/2016 0934   LABSPEC 1.023 08/02/2016 0934   PHURINE 7.5 08/02/2016 0934   GLUCOSEU NEGATIVE 08/02/2016 0934   HGBUR NEGATIVE 08/02/2016 0934   HGBUR negative 09/23/2010 0825   BILIRUBINUR NEGATIVE 08/02/2016 0934   BILIRUBINUR Neg 02/11/2016 1150   KETONESUR NEGATIVE 08/02/2016 0934   PROTEINUR NEGATIVE 08/02/2016 0934   UROBILINOGEN 2.0 02/11/2016 1150   UROBILINOGEN negative 09/23/2010 0825   NITRITE POSITIVE (A) 08/02/2016 0934   LEUKOCYTESUR LARGE (A) 08/02/2016 0934   Sepsis Labs: @LABRCNTIP (procalcitonin:4,lacticidven:4)  ) Recent Results (from the past 240 hour(s))  MRSA PCR Screening     Status: None   Collection Time: 08/01/16  4:53 AM  Result Value Ref Range Status   MRSA by PCR NEGATIVE NEGATIVE Final    Comment:        The GeneXpert MRSA Assay (FDA approved for NASAL specimens only), is one component of a comprehensive MRSA colonization surveillance program. It is not intended to diagnose MRSA infection nor to guide or monitor treatment for MRSA infections.      Studies: No results found.  Scheduled Meds: . aspirin EC  325 mg Oral Daily  . cefUROXime  250 mg Oral BID WC  . digoxin  0.125 mg Oral Daily  .  diltiazem  120 mg Oral Q12H  . fluticasone  1 spray Each Nare Daily  . losartan  50 mg Oral Daily  . methylPREDNISolone (SOLU-MEDROL) injection  40 mg Intravenous Q12H  . metoprolol tartrate  50 mg Oral BID  . multivitamin with minerals  1 tablet Oral Daily  . omega-3 acid ethyl esters  1 g Oral Daily  . sertraline  50 mg Oral Daily    Continuous Infusions:     LOS: 6 days    CHIU, Orpah Melter, MD [facility] Pager [phone] If 7PM-7AM, please contact night-coverage [email or web address] Password [email or web address] 08/07/2016, 5:46 PM

## 2016-08-07 NOTE — Progress Notes (Signed)
Patient Name: Norma Ford Date of Encounter: 08/07/2016  Primary Cardiologist: Joaquim Nam, MD   Hospital Problem List     Principal Problem:   Atrial fibrillation with rapid ventricular response [facility]) Active Problems:   Essential hypertension   Morbid obesity ([place])   History of fall   Frequent falls   Thigh pain   Toe fracture, right   Depression   Acute diastolic CHF     Subjective   Pt reports she is feeling better though still wheezing- getting a breathing Rx now.    Inpatient Medications    . aspirin EC  325 mg Oral Daily  . azithromycin  250 mg Oral Daily  . cefUROXime  250 mg Oral BID WC  . diltiazem  120 mg Oral Q12H  . fluticasone  1 spray Each Nare Daily  . furosemide  80 mg Intravenous BID  . losartan  50 mg Oral Daily  . methylPREDNISolone (SOLU-MEDROL) injection  40 mg Intravenous Q12H  . metoprolol tartrate  25 mg Oral BID  . multivitamin with minerals  1 tablet Oral Daily  . omega-3 acid ethyl esters  1 g Oral Daily  . sertraline  50 mg Oral Daily    Vital Signs    Vitals:   08/06/16 1648 08/06/16 2248 08/07/16 0333 08/07/16 0428  BP:  123/66  125/72  Pulse:  (!) 108  81  Resp:  17  18  Temp:  97.8 F (36.6 C)  97.7 F (36.5 C)  TempSrc:  Oral  Oral  SpO2: 95% 94% 90% 95%  Weight:      Height:        Intake/Output Summary (Last 24 hours) at 08/07/16 0820 Last data filed at 08/06/16 2249  Gross per 24 hour  Intake              480 ml  Output             2575 ml  Net            -2095 ml   Filed Weights   08/04/16 0453 08/05/16 0503 08/06/16 0447  Weight: 294 lb 5 oz (133.5 kg) 292 lb 5.3 oz (132.6 kg) 287 lb 11.2 oz (130.5 kg)    Physical Exam   GEN: Well nourished, well developed, in no acute distress.  HEENT: Grossly normal.  Neck: Supple, obese, difficult to gauge JVP, no carotid bruits, or masses. Cardiac: IR, IR, no murmurs, rubs, or gallops. No clubbing, cyanosis, 1+ bilat LE edema. Respiratory:  Mild wheeze in right  base, gradually improving  GI: Obese, soft, nontender, nondistended, BS + x 4. MS: no deformity or atrophy. Skin: warm and dry, no rash. Neuro:  Strength and sensation are intact. Psych: AAOx3.  Normal affect.  Labs    CBC  Recent Labs  08/06/16 0508 08/07/16 0511  WBC 12.0* 13.0*  HGB 12.7 12.4  HCT 35.8* 38.3  MCV 95.2 98.2  PLT 287 [id number]   Basic Metabolic Panel  Recent Labs  08/06/16 0508 08/07/16 0511  NA 138 139  K 3.6 3.7  CL 100* 97*  CO2 33* 35*  GLUCOSE 140* 124*  BUN 22* 25*  CREATININE 0.64 0.60  CALCIUM 8.5* 8.4*    Telemetry    Afib - 80's to 90's.  Occas 1.6 sec pause.  No prolonged pauses.  Radiology    No results found. 2D Echocardiogram 9.25.2017 ------------------------------------------------------------------- Study Conclusions   - Left ventricle: The cavity size was normal. Wall  thickness was   increased in a pattern of mild LVH. Systolic function was normal.   The estimated ejection fraction was in the range of 60% to 65%.  Patient Profile     82 y/o morbidly obese female, sedentary secondary to DJD, with h/o hypertension, falls, adrenal tumor, and Barrett's esophagus, who presented to [facility] 07/31/16 after sustaining a mechanical fall resulting in right great toe Fx. Found to be in new onset atrial fibrillation w/ RVR. She also has suspected combined pneumonia and diastolic CHF.   Assessment & Plan    1.  R Great Toe Fx/Fall:  No significant pain.  Wearing post-op shoe.  Ortho has prev seen.  2.  Afib RVR:  In setting of above.  Unclear of time of onset.  Pt noted fatigue a few days prior to admission bu was not having palpitations/c/p/dyspnea.    Despite CHA2DS2VASc of 4, she is not on [place] 2/2 h/o multiple falls.  No plans for dccv @ this time. HR is improving  Rate is still fast - I have increased the metoprolol to 50 BID Continue dilt 120 CD I have added low dose digoxin to help with rate control I do not feel strongly about  continuing  Digoxin  long term but if it helps with rate control , we can continue .  I would suggest continued up-titration of the Metoprolol and diltiazem as our primary rate controlling meds.   3.  Essential HTN:  Stable on  blocker and dilt.  4.  Acute diastolic CHF on admission- she has diuresed 5.7 L   Is not requiring lasix at present.  Will contiinue to evaluate.   5.  Morbid obesity- BMI 53. I suspect she has sleep apnea, consider sleep study at some point as an OP.      Mertie Moores, MD  08/07/2016 8:20 AM    [address],  [place], [place]  [address] Pager [phone] Phone: [phone]; Fax: [phone]

## 2016-08-08 LAB — BASIC METABOLIC PANEL
Anion gap: 6 (ref 5–15)
BUN: 32 mg/dL — AB (ref 4–21)
BUN: 32 mg/dL — ABNORMAL HIGH (ref 6–20)
CO2: 35 mmol/L — ABNORMAL HIGH (ref 22–32)
Calcium: 8.5 mg/dL — ABNORMAL LOW (ref 8.9–10.3)
Chloride: 97 mmol/L — ABNORMAL LOW (ref 101–111)
Creatinine, Ser: 0.68 mg/dL (ref 0.44–1.00)
Creatinine: 0.7 mg/dL (ref 0.5–1.1)
GFR calc Af Amer: >60 mL/min (ref >60)
GFR calc non Af Amer: >60 mL/min (ref >60)
GLUCOSE: 132 mg/dL
Glucose, Bld: 132 mg/dL — ABNORMAL HIGH (ref 65–99)
Potassium: 3.8 mmol/L (ref 3.4–5.3)
Potassium: 3.8 mmol/L (ref 3.5–5.1)
Sodium: 138 mmol/L (ref 135–145)
Sodium: 138 mmol/L (ref 137–147)

## 2016-08-08 LAB — GLUCOSE, CAPILLARY: Glucose-Capillary: 111 mg/dL — ABNORMAL HIGH (ref 65–99)

## 2016-08-08 NOTE — Progress Notes (Signed)
Physical Therapy Treatment Patient Details Name: Norma Ford MRN: [id number] DOB: 1930/09/10 Today's Date: 08/08/2016    History of Present Illness 82 y/o female with h/o hypertension, hyperlipidemia, depression, adrenal tumor, Barrett's esophagus, who presented to [facility] after sustaining a mechanical fall resulting in right great toe Fx. Found to be in new onset atrial fibrillation w/ RVR.     PT Comments    Pt requires encouragement to participate, she c/o of chest pressure (although states it resolved after back to bed) so session limited; pt is requiring less assist with basic transfers and should progress well at SNF  Follow Up Recommendations  Supervision/Assistance - 24 hour;SNF     Equipment Recommendations  None recommended by PT    Recommendations for Other Services       Precautions / Restrictions Precautions Precautions: Fall Restrictions Weight Bearing Restrictions: No    Mobility  Bed Mobility Overal bed mobility: Needs Assistance;+2 for physical assistance;+ 2 for safety/equipment Bed Mobility: Sit to Supine       Sit to supine: Mod assist;Max assist;+2 for safety/equipment   General bed mobility comments: assist for legs and trunk, cues for incr self assist/technique  Transfers Overall transfer level: Needs assistance Equipment used: Rolling walker (2 wheeled) Transfers: Sit to/from [facility] Sit to Stand: Min assist Stand pivot transfers: Min assist;+2 safety/equipment       General transfer comment:  repeated x2; cues for wt shift, foot placement and hand placement  Ambulation/Gait Ambulation/Gait assistance: Supervision Ambulation Distance (Feet): 4 Feet Assistive device: Rolling walker (2 wheeled) Gait Pattern/deviations: Step-to pattern     General Gait Details: pivotal steps back to bed, pt with c/o fatigue and chest pressure; O2 sats in 90s on 3L, HR 96-119; RN aware   Stairs            Wheelchair Mobility     Modified Rankin (Stroke Patients Only)       Balance             Standing balance-Leahy Scale: Poor                      Cognition Arousal/Alertness: Awake/alert Behavior During Therapy: WFL for tasks assessed/performed Overall Cognitive Status: Within Functional Limits for tasks assessed                      Exercises      General Comments        Pertinent Vitals/Pain Pain Assessment: No/denies pain    Home Living                      Prior Function            PT Goals (current goals can now be found in the care plan section) Acute Rehab PT Goals Patient Stated Goal: get better PT Goal Formulation: With patient Time For Goal Achievement: 08/16/16 Potential to Achieve Goals: Good Progress towards PT goals: Progressing toward goals    Frequency    Min 3X/week      PT Plan Current plan remains appropriate;Discharge plan needs to be updated    Co-evaluation             End of Session Equipment Utilized During Treatment: Gait belt Activity Tolerance: Patient limited by fatigue Patient left: in bed;with call bell/phone within reach;with family/visitor present;with bed alarm set     Time: 1006-1030 PT Time Calculation (min) (ACUTE ONLY): 24 min  Charges:  $  Therapeutic Activity: 23-37 mins                    G Codes:      Rodgers Likes 08/26/16, 1:30 PM

## 2016-08-08 NOTE — Care Management Important Message (Signed)
Important Message  Patient Details  Name: Norma Ford MRN: [id number] Date of Birth: 25-Apr-1930   Medicare Important Message Given:  Yes    McGibboneyOletta Darter, RN 08/08/2016, 3:52 PM

## 2016-08-08 NOTE — NC FL2 (Signed)
[place] MEDICAID FL2 LEVEL OF CARE SCREENING TOOL     IDENTIFICATION  Patient Name: Norma Ford Birthdate: 06/26/1930 Sex: female Admission Date (Current Location): 07/31/2016  [facility] [place]   Facility and Address:  [facility],  [address], [place] [id number]      Provider Number: [id number]  Attending Physician Name and Address:  Jerald KiefStephen K Chiu, MD  Relative Name and Phone Number:       Current Level of Care: Hospital Recommended Level of Care: Skilled Nursing Facility Prior Approval Number:    Date Approved/Denied:   PASRR Number:    Discharge Plan: SNF    Current Diagnoses: Patient Active Problem List   Diagnosis Date Noted  . Acute diastolic CHF 08/04/2016  . Atrial fibrillation with rapid ventricular response (HCC) 08/01/2016  . Fall 08/01/2016  . Thigh pain 08/01/2016  . Toe fracture, right 08/01/2016  . Atrial fibrillation with RVR (HCC) 08/01/2016  . Depression 08/01/2016  . Right leg pain 07/01/2016  . Frequent falls 02/14/2016  . History of fall 11/14/2015  . Osteoarthritis of both knees 05/15/2015  . Morbid obesity (HCC) 02/24/2014  . Cellulitis and abscess of lower leg 05/21/2012  . Calf pain 05/21/2012  . Edema 05/21/2012  . Chronic bronchitis 11/03/2011  . Hypoxia 10/20/2011  . THROMBOCYTOPENIA 12/21/2009  . DECUBITUS ULCER, BUTTOCK 12/21/2009  . MYCOSIS FUNGOIDES LYMPH NODES MULTIPLE SITES 09/21/2009  . DEGENERATIVE JOINT DISEASE, KNEE 09/21/2009  . SHOULDER PAIN, RIGHT 09/21/2009  . FEMALE STRESS INCONTINENCE 01/16/2009  . HLD (hyperlipidemia) 02/12/2008  . Essential hypertension 02/12/2008    Orientation RESPIRATION BLADDER Height & Weight     Self, Time, Situation, Place  O2 (2L) Indwelling catheter Weight: 284 lb 6.3 oz (129 kg) Height:  5\' 2"  (157.5 cm)  BEHAVIORAL SYMPTOMS/MOOD NEUROLOGICAL BOWEL NUTRITION STATUS      Continent Diet  AMBULATORY STATUS COMMUNICATION OF NEEDS Skin    Extensive Assist Verbally Normal                       Personal Care Assistance Level of Assistance  Bathing, Feeding, Dressing Bathing Assistance: Limited assistance Feeding assistance: Independent Dressing Assistance: Limited assistance     Functional Limitations Info  Sight, Hearing, Speech Sight Info: Impaired Hearing Info: Adequate Speech Info: Adequate    SPECIAL CARE FACTORS FREQUENCY  PT (By licensed PT), OT (By licensed OT)     PT Frequency: 5 OT Frequency: 5            Contractures Contractures Info: Not present    Additional Factors Info  Code Status, Allergies Code Status Info: Full Code Allergies Info: Codeine           Current Medications (08/08/2016):  This is the current hospital active medication list Current Facility-Administered Medications  Medication Dose Route Frequency Provider Last Rate Last Dose  . acetaminophen (TYLENOL) tablet 650 mg  650 mg Oral Q4H PRN Lorretta HarpXilin Niu, MD   650 mg at 08/05/16 2136  . ALPRAZolam Prudy Feeler(XANAX) tablet 0.25 mg  0.25 mg Oral BID PRN Lorretta HarpXilin Niu, MD   0.25 mg at 08/06/16 2155  . aspirin EC tablet 325 mg  325 mg Oral Daily Lorretta HarpXilin Niu, MD   325 mg at 08/08/16 1100  . cefUROXime (CEFTIN) tablet 250 mg  250 mg Oral BID WC Jerald KiefStephen K Chiu, MD   250 mg at 08/08/16 0900  . digoxin (LANOXIN) tablet 0.125 mg  0.125 mg Oral Daily  Thayer Headings, MD   0.125 mg at 08/08/16 1100  . diltiazem (CARDIZEM SR) 12 hr capsule 120 mg  120 mg Oral Q12H Luke K Kilroy, PA-C   120 mg at 08/08/16 1100  . fluticasone (FLONASE) 50 MCG/ACT nasal spray 1 spray  1 spray Each Nare Daily Ivor Costa, MD   1 spray at 08/07/16 1032  . HYDROcodone-acetaminophen (NORCO/VICODIN) 5-325 MG per tablet 1 tablet  1 tablet Oral Q4H PRN Mir Marry Guan, MD   1 tablet at 08/07/16 2111  . levalbuterol (XOPENEX) nebulizer solution 0.63 mg  0.63 mg Nebulization Q4H PRN Donne Hazel, MD      . losartan (COZAAR) tablet 50 mg  50 mg Oral Daily Erlene Quan, PA-C    50 mg at 08/08/16 1100  . methocarbamol (ROBAXIN) 500 mg in dextrose 5 % 50 mL IVPB  500 mg Intravenous Q8H PRN Ivor Costa, MD      . methylPREDNISolone sodium succinate (SOLU-MEDROL) 40 mg/mL injection 40 mg  40 mg Intravenous Q12H Donne Hazel, MD   40 mg at 08/08/16 0435  . metoprolol (LOPRESSOR) tablet 50 mg  50 mg Oral BID Thayer Headings, MD   50 mg at 08/08/16 1100  . morphine 2 MG/ML injection 1 mg  1 mg Intravenous Q4H PRN Mir Marry Guan, MD   1 mg at 08/01/16 0811  . multivitamin with minerals tablet 1 tablet  1 tablet Oral Daily Ivor Costa, MD   1 tablet at 08/08/16 1100  . omega-3 acid ethyl esters (LOVAZA) capsule 1 g  1 g Oral Daily Ivor Costa, MD   1 g at 08/08/16 1100  . ondansetron (ZOFRAN) injection 4 mg  4 mg Intravenous Q8H PRN Ivor Costa, MD      . polyethylene glycol (MIRALAX / GLYCOLAX) packet 17 g  17 g Oral BID PRN Ivor Costa, MD   17 g at 08/06/16 1759  . sertraline (ZOLOFT) tablet 50 mg  50 mg Oral Daily Ivor Costa, MD   50 mg at 08/08/16 1100  . zolpidem (AMBIEN) tablet 5 mg  5 mg Oral QHS PRN Ivor Costa, MD   5 mg at 08/02/16 2204     Discharge Medications: Please see discharge summary for a list of discharge medications.  Relevant Imaging Results:  Relevant Lab Results:   Additional Information SSN:  [id number]  Lilly Cove, [place]

## 2016-08-08 NOTE — Clinical Social Work Note (Signed)
Clinical Social Work Assessment  Patient Details  Name: Norma Ford MRN: [id number] Date of Birth: 1930-04-18  Date of referral:  08/08/16               Reason for consult:  Facility Placement                Permission sought to share information with:  Case Manager, Customer service manager, Family Supports Permission granted to share information::  Yes, Verbal Permission Granted  Name::        Agency::  [facility]  Relationship::  Daugther in room  Contact Information:     Housing/Transportation Living arrangements for the past 2 months:  [place] of Information:  Patient, Medical Team, Case Manager, Adult Children Patient Interpreter Needed:  None Criminal Activity/Legal Involvement Pertinent to Current Situation/Hospitalization:  No - Comment as needed Significant Relationships:  Adult Children, Warehouse manager, Other Family Members Lives with:  Self Do you feel safe going back to the place where you live?  No Need for family participation in patient care:  Yes (Comment)  Care giving concerns:  LCSW met with patient at the bedside and reports she was planning to go home, however she was unable to stand today and daughter cannot care for her with current level of care warranted. Both agreeable to SNF placement feeling it was the safest disposition.   Social Worker assessment / plan:  LCSW received call from unit that family requesting information about SNF.  There was no formal consult.  LCSW met with patient and family and both in agreement with SNF.  LCSW will complete work up and begin SNF process.  Patient will be given bed offers once available.  Employment status:  Retired Nurse, adult PT Recommendations:  Worth / Referral to community resources:  [place]  Patient/Family's Response to care:  Agreeable to plan  Patient/Family's Understanding of and Emotional  Response to Diagnosis, Current Treatment, and Prognosis:  Family and patient aware of the fluid on her legs and new O2 status and need for SNF.  Emotional Assessment Appearance:  Appears stated age Attitude/Demeanor/Rapport:  Other (cooperative and pleasant) Affect (typically observed):  Accepting, Adaptable Orientation:  Oriented to Self, Oriented to Place, Oriented to  Time, Oriented to Situation Alcohol / Substance use:  Not Applicable Psych involvement (Current and /or in the community):  No (Comment)  Discharge Needs  Concerns to be addressed:  No discharge needs identified Readmission within the last 30 days:  No Current discharge risk:  None Barriers to Discharge:  Continued Medical Work up   [place], LCSW 08/08/2016, 3:32 PM

## 2016-08-08 NOTE — Progress Notes (Signed)
PROGRESS NOTE  Norma Ford [id number] DOB: Mar 08, 1930 DOA: 07/31/2016 PCP: [facility] Hospital Course/Subjective: 82 y.o. female with medical history significant of hypertension, hyperlipidemia, depression, adrenal tumor, Barrett's esophagus, mycosis fungoides lymph nodes, morbid obesity, frequent fall, who presents with fall, right great toe pain, bilateral thigh pain. She has right great toe fracture and was found to be in Rapid Afib which is a new diagnosis for her.   She has been started on BID PO metoprolol by cardiology and still on IV diltiazem drip at 5mg /hr. Family at bedside, patient is comfortable, denies chest pain, dizziness. She developed a sensation of swollen tongue last night, but says it is stable, denies trouble swallowing, shortness of breath, pain/throat pain or stridor.  Subjective Complained of shortness of breath and increased weakness when working with physical therapy today  Assessment/Plan: Atrial fibrillation with rapid ventricular response [facility]): Patient initially noted to have new onset atrial fibrillation with heart rates up to 135.  Earlier this admission, heart rates were difficult to control and patient required Cardizem drip. - Cardiology service following -HR more elevated this AM -Patient remains on PO Cardizem - Recently increased metoprolol to 50mg  BID as tolerated - digoxin was added per Cardiology - Patient is also continued on ASA 325 mg daily - 2d echo was was reviewed and found to be unremarkable - Continuing to hold full anticoagulation given history of falls  Right great toe fracture and bilateral thigh pain: No evidence of hip Fx by X-ray. - Pain control: Continue morphine prn and percocet - Continue with PRN Zofran for nausea - Will continue Robaxin for muscle spasm as tolerated - On 9/25, patient was seen by Dr. Sharol Given who recommended weightbearing with postop boot  HLD: Last LDL was 111/15/16. Patient was on Zocor,  but had not been taking -Patient will be continued on home medications as tolerated  HTN: -continues on Lasix and Cozaar as tolerated -Patient to be continued on PO Cardizem and PO metoprolol at 50 mg twice daily -Continued on 80 mg twice a day IV Lasix as tolerated  Frequent falls:  -Suspect related to generalized deconditioning as well as morbid obesity -Will continue weightbearing as tolerated per orthopedic recommendations.  -Therapy recs for SNF at discharge  Depression and anxiety: Remains stable, no suicidal or homicidal ideations. -Continued on home medications including zoloft  Suspected acute bronchitis versus hospital-acquired pneumonia -Recent cxr with new hilar airspace disease concern for possible infectious process. -currently on bronchodilators and IV steroids -Patient is continued on empiric azithromycin and Ceftin which should cover for pneumonia. -Difficulty weaning O2 (see below was (  Acutely decompensated diastolic CHF -O2 requirements down to 1.5L this AM, later needing up to 4 L nasal cannula -Patient recently observed to snore heavily while sleeping. Strongly suspect underlying obstructive sleep apnea with obesity hypoventilation. -Will strongly recommend outpatient sleep study -Lasix now on hold per cardiology  Acute hypoxia -Patient is O2 naive prior to admission -Attempt to wean O2 as tolerated -recommend sleep study per above -Difficult to wean O2, suspect obesity hypoventilation is a large factor  DVT ppx: SCD Code Status: Full code  Family Communication: Yes, patient's family at bed side Disposition Plan:   plan skilled nursing facility at time of discharge.  Consults called: Cardiology, Orthopedics  Admission status:  SDU/inpatient  DVT Prophylaxis: SCDs in case of surgical intervention   Objective: Vitals:   08/07/16 2042 08/08/16 0623 08/08/16 1100 08/08/16 1506  BP: 114/71 109/68 (!) 143/74  107/67  Pulse: 86 (!) 102  80  Resp:  18 16  19   Temp: 98.2 F (36.8 C) 97.5 F (36.4 C)  97.5 F (36.4 C)  TempSrc: Oral Oral  Oral  SpO2: 93% 95%  97%  Weight:  129 kg (284 lb 6.3 oz)    Height:        Intake/Output Summary (Last 24 hours) at 08/08/16 1740 Last data filed at 08/08/16 1507  Gross per 24 hour  Intake              480 ml  Output             1050 ml  Net             -570 ml   Filed Weights   08/05/16 0503 08/06/16 0447 08/08/16 0623  Weight: 132.6 kg (292 lb 5.3 oz) 130.5 kg (287 lb 11.2 oz) 129 kg (284 lb 6.3 oz)     Exam: General: Awake, eating lunch, conversant Neck: Supple neck, trachea appears midline Cardiovascular: Rhythm is irregular, S1-S2 auscultated Respiratory: Normal chest rise, no crackles Abdomen: Morbidly obese, no masses Skin: No cyanosis, no notable skin lesions seen Musculoskeletal: No joint deformities, perfused Psychiatric: Affect normal, no visual hallucinations Neurologic: No tremors, no seizures   Data Reviewed: CBC:  Recent Labs Lab 08/03/16 0401 08/04/16 0455 08/05/16 0507 08/06/16 0508 08/07/16 0511  WBC 6.3 5.6 4.9 12.0* 13.0*  HGB 11.0* 11.6* 11.8* 12.7 12.4  HCT 34.0* 36.5 36.5 35.8* 38.3  MCV 99.7 100.3* 99.2 95.2 98.2  PLT 190 211 242 287 [id number]   Basic Metabolic Panel:  Recent Labs Lab 08/03/16 0401 08/04/16 0455 08/05/16 0507 08/06/16 0508 08/07/16 0511 08/08/16 0428  NA 138 138 138 138 139 138  K 4.1 4.2 4.2 3.6 3.7 3.8  CL 101 100* 100* 100* 97* 97*  CO2 30 32 32 33* 35* 35*  GLUCOSE 100* 88 131* 140* 124* 132*  BUN 21* 22* 17 22* 25* 32*  CREATININE 0.78 0.77 0.56 0.64 0.60 0.68  CALCIUM 8.5* 8.4* 8.5* 8.5* 8.4* 8.5*  MG 2.0  --   --   --   --   --    GFR: Estimated Creatinine Clearance: 67.5 mL/min (by C-G formula based on SCr of 0.68 mg/dL). Liver Function Tests: No results for input(s): AST, ALT, ALKPHOS, BILITOT, PROT, ALBUMIN in the last 168 hours. No results for input(s): LIPASE, AMYLASE in the last 168 hours. No results  for input(s): AMMONIA in the last 168 hours. Coagulation Profile: No results for input(s): INR, PROTIME in the last 168 hours. Cardiac Enzymes: No results for input(s): CKTOTAL, CKMB, CKMBINDEX, TROPONINI in the last 168 hours. BNP (last 3 results) No results for input(s): PROBNP in the last 8760 hours. HbA1C: No results for input(s): HGBA1C in the last 72 hours. CBG:  Recent Labs Lab 08/04/16 0732 08/05/16 0728 08/06/16 0820 08/07/16 0727 08/08/16 0737  GLUCAP 94 127* 145* 135* 111*   Lipid Profile: No results for input(s): CHOL, HDL, LDLCALC, TRIG, CHOLHDL, LDLDIRECT in the last 72 hours. Thyroid Function Tests: No results for input(s): TSH, T4TOTAL, FREET4, T3FREE, THYROIDAB in the last 72 hours. Anemia Panel: No results for input(s): VITAMINB12, FOLATE, FERRITIN, TIBC, IRON, RETICCTPCT in the last 72 hours. Urine analysis:    Component Value Date/Time   COLORURINE AMBER (A) 08/02/2016 0934   APPEARANCEUR CLOUDY (A) 08/02/2016 0934   LABSPEC 1.023 08/02/2016 0934   PHURINE 7.5 08/02/2016 0934   GLUCOSEU NEGATIVE 08/02/2016  [place] 08/02/2016 0934   HGBUR negative 09/23/2010 0825   BILIRUBINUR NEGATIVE 08/02/2016 [place] 02/11/2016 [place] 08/02/2016 0934   PROTEINUR NEGATIVE 08/02/2016 0934   UROBILINOGEN 2.0 02/11/2016 1150   UROBILINOGEN negative 09/23/2010 0825   NITRITE POSITIVE (A) 08/02/2016 0934   LEUKOCYTESUR LARGE (A) 08/02/2016 0934   Sepsis Labs: @LABRCNTIP (procalcitonin:4,lacticidven:4)  ) Recent Results (from the past 240 hour(s))  MRSA PCR Screening     Status: None   Collection Time: 08/01/16  4:53 AM  Result Value Ref Range Status   MRSA by PCR NEGATIVE NEGATIVE Final    Comment:        The GeneXpert MRSA Assay (FDA approved for NASAL specimens only), is one component of a comprehensive MRSA colonization surveillance program. It is not intended to diagnose MRSA infection nor to guide  or monitor treatment for MRSA infections.      Studies: No results found.  Scheduled Meds: . aspirin EC  325 mg Oral Daily  . cefUROXime  250 mg Oral BID WC  . digoxin  0.125 mg Oral Daily  . diltiazem  120 mg Oral Q12H  . fluticasone  1 spray Each Nare Daily  . losartan  50 mg Oral Daily  . methylPREDNISolone (SOLU-MEDROL) injection  40 mg Intravenous Q12H  . metoprolol tartrate  50 mg Oral BID  . multivitamin with minerals  1 tablet Oral Daily  . omega-3 acid ethyl esters  1 g Oral Daily  . sertraline  50 mg Oral Daily    Continuous Infusions:     LOS: 7 days    Sheanna Dail, Orpah Melter, MD [facility] Pager [phone] If 7PM-7AM, please contact night-coverage [email or web address] Password [email or web address] 08/08/2016, 5:40 PM

## 2016-08-08 NOTE — Progress Notes (Signed)
Patient Name: Norma Ford Date of Encounter: 08/08/2016  Primary Cardiologist: Joaquim Nam, MD   Hospital Problem List     Principal Problem:   Atrial fibrillation with rapid ventricular response [facility]) Active Problems:   Essential hypertension   Morbid obesity ([place])   History of fall   Frequent falls   Thigh pain   Toe fracture, right   Depression   Acute diastolic CHF     Subjective   Pt reports she is feeling better, talkative, in good spirits.   Inpatient Medications    . aspirin EC  325 mg Oral Daily  . cefUROXime  250 mg Oral BID WC  . digoxin  0.125 mg Oral Daily  . diltiazem  120 mg Oral Q12H  . fluticasone  1 spray Each Nare Daily  . losartan  50 mg Oral Daily  . methylPREDNISolone (SOLU-MEDROL) injection  40 mg Intravenous Q12H  . metoprolol tartrate  50 mg Oral BID  . multivitamin with minerals  1 tablet Oral Daily  . omega-3 acid ethyl esters  1 g Oral Daily  . sertraline  50 mg Oral Daily    Vital Signs    Vitals:   08/07/16 1439 08/07/16 1830 08/07/16 2042 08/08/16 0623  BP: 118/70  114/71 109/68  Pulse: 90  86 (!) 102  Resp: 19  18 16   Temp: 98 F (36.7 C)  98.2 F (36.8 C) 97.5 F (36.4 C)  TempSrc: Oral  Oral Oral  SpO2: 91% 93% 93% 95%  Weight:    284 lb 6.3 oz (129 kg)  Height:        Intake/Output Summary (Last 24 hours) at 08/08/16 0901 Last data filed at 08/08/16 [id number]  Gross per 24 hour  Intake              240 ml  Output             3150 ml  Net            -2910 ml   Filed Weights   08/05/16 0503 08/06/16 0447 08/08/16 0623  Weight: 292 lb 5.3 oz (132.6 kg) 287 lb 11.2 oz (130.5 kg) 284 lb 6.3 oz (129 kg)    Physical Exam   GEN: Morbidly obese, on O2,  in no acute distress.  HEENT: Grossly normal.  Neck: Supple, obese, difficult to gauge JVP, no carotid bruits, or masses. Cardiac: IR, IR, no murmurs, rubs, or gallops. No clubbing, cyanosis, 1+ bilat LE edema. Respiratory:  Decreased at bases GI: Obese, soft,  nontender, nondistended, BS + x 4. MS: no deformity or atrophy. Skin: warm and dry, no rash. Neuro:  Strength and sensation are intact. Psych: AAOx3.  Normal affect.  Labs    CBC  Recent Labs  08/06/16 0508 08/07/16 0511  WBC 12.0* 13.0*  HGB 12.7 12.4  HCT 35.8* 38.3  MCV 95.2 98.2  PLT 287 [id number]   Basic Metabolic Panel  Recent Labs  08/07/16 0511 08/08/16 0428  NA 139 138  K 3.7 3.8  CL 97* 97*  CO2 35* 35*  GLUCOSE 124* 132*  BUN 25* 32*  CREATININE 0.60 0.68  CALCIUM 8.4* 8.5*    Telemetry    Afib - 100.  Occas 1.6 sec pause.  No prolonged pauses.  Radiology    No results found. 2D Echocardiogram 9.25.2017 ------------------------------------------------------------------- Study Conclusions   - Left ventricle: The cavity size was normal. Wall thickness was   increased in a pattern of mild LVH.  Systolic function was normal.   The estimated ejection fraction was in the range of 60% to 65%.  Patient Profile     Norma Ford, sedentary secondary to DJD, with h/o hypertension, falls, adrenal tumor, and Barrett's esophagus, who presented to [facility] 07/31/16 after sustaining a mechanical fall resulting in right great toe Fx. Found to be in new onset atrial fibrillation w/ RVR. She also has suspected combined pneumonia and diastolic CHF.   Assessment & Plan    1.  R Great Toe Fx/Fall:  No significant pain.  Wearing post-op shoe.  Ortho has prev seen. She tells me she is going to rehab, I have yet to see her out of bed.   2.  Afib RVR:  In setting of above.  Unclear of time of onset.  Pt noted fatigue a few days prior to admission bu was not having palpitations/c/p/dyspnea. Despite CHA2DS2VASc of 4, she is not on [place] 2/2 h/o multiple falls.  No plans for dccv @ this time.  Rate is still fast - I have increased the metoprolol to 50 BID Continue dilt 120 CD I have added low dose digoxin to help with rate control I do not feel strongly about continuing   Digoxin  long term but if it helps with rate control , we can continue .  I would suggest continued up-titration of the Metoprolol and diltiazem as our primary rate controlling meds.   3.  Essential HTN:  Stable on  blocker and dilt.  4.  Acute diastolic CHF on admission- she has diuresed 10.4 L   Is not requiring lasix at present but will probably need home dose. Will contiinue to evaluate.   5.  Morbid obesity- BMI 53. I suspect she has sleep apnea, consider sleep study at some point as an OP.   Plan: I will discuss Lasix dosing with MD, she appears close to going to rehab though still requiring O2. BUN has drifted up with aggressive diuresis the last few days- Lasix currently on hold. F/U with Dr Audrea Muscat will be arranged at discharge.      Kerin Ransom, [place]  08/08/2016 9:01 AM    [name] seen and examined. Agree with above.  -Dizzy when standing with PT -Continue with holding lasix (BUN increased) -Will need home dose lasix when DC'd -Obesity hypoventilation syndrome.   Candee Furbish, MD

## 2016-08-08 NOTE — Progress Notes (Signed)
OT Cancellation Note  Patient Details Name: Norma Ford MRN: [id number] DOB: 08-Sep-1930   Cancelled Treatment:    Reason Eval/Treat Not Completed: Other (comment)  Pt meeting with chaplain. Will check on pt later in day or next day Kari Baars, [place] Payton Mccallum D 08/08/2016, 1:03 PM

## 2016-08-08 NOTE — Progress Notes (Signed)
SATURATION QUALIFICATIONS:   Patient Saturations on Room Air at Rest = 93 %  Patient Saturations on Room Air while Ambulating =  Pt did not ambulate. Pt moved from bed to [facility]. 85-87%  Patient Saturations 93-95% 2 lt  Please briefly explain why patient needs home oxygen: Attempted to wean oxygen today, unsuccessful. Oxygen 4 lt weaned down to 2lt. 95%. Attempt made without O2 at 2lt. 93% at rest. Pt moved to nedside commode O2 sat decreased 85-87% ra. Will continue to monitor

## 2016-08-08 NOTE — Progress Notes (Addendum)
Chaplain consulting due to pt request / RN referral.    Pt completed Advance Directive.  Chaplain notarized.  Pt with original and copies.  Copy placed in chart on unit, copy given to Medical Records to be scanned into EPIC.     Chaplain provided emotional and spiritual support around hospitalization.  Norma Ford is in good spirits.  Well-supported by her daughter in law at bedside.  [name] tells story of her DiL and son reconnecting two years prior.  Norma Ford had 5 children and was surrogate mother to others in her life who needed support.  She grew up in [place] area and her spouse was a Mining engineer in [place].  He died two years prior.   In speaking about end of life, Norma Ford stated "I pray that my brain and my body leave this world at the same time."   She requested prayers.  Chaplain prayed with Norma Ford at bedside.       Norma Ford, Norma Ford

## 2016-08-09 DIAGNOSIS — S92911A Unspecified fracture of right toe(s), initial encounter for closed fracture: Secondary | ICD-10-CM

## 2016-08-09 LAB — BASIC METABOLIC PANEL
Anion gap: 5 (ref 5–15)
BUN: 30 mg/dL — AB (ref 4–21)
BUN: 30 mg/dL — AB (ref 6–20)
CHLORIDE: 98 mmol/L — AB (ref 101–111)
CO2: 35 mmol/L — AB (ref 22–32)
CREATININE: 0.8 mg/dL (ref 0.5–1.1)
Calcium: 8.8 mg/dL — ABNORMAL LOW (ref 8.9–10.3)
Creatinine, Ser: 0.76 mg/dL (ref 0.44–1.00)
GFR calc Af Amer: >60 mL/min (ref >60)
GFR calc non Af Amer: >60 mL/min (ref >60)
Glucose, Bld: 142 mg/dL — ABNORMAL HIGH (ref 65–99)
Glucose: 142 mg/dL
POTASSIUM: 4.6 mmol/L (ref 3.4–5.3)
POTASSIUM: 4.6 mmol/L (ref 3.5–5.1)
SODIUM: 138 mmol/L (ref 137–147)
SODIUM: 138 mmol/L (ref 135–145)

## 2016-08-09 LAB — GLUCOSE, CAPILLARY: GLUCOSE-CAPILLARY: 129 mg/dL — AB (ref 65–99)

## 2016-08-09 LAB — TROPONIN I

## 2016-08-09 NOTE — Progress Notes (Signed)
PROGRESS NOTE  Norma Ford [id number] DOB: Nov 07, 1930 DOA: 07/31/2016 PCP: [place] [facility] Hospital Course/Subjective: 82 y.o. female with medical history significant of hypertension, hyperlipidemia, depression, adrenal tumor, Barrett's esophagus, mycosis fungoides lymph nodes, morbid obesity, frequent fall, who presents with fall, right great toe pain, bilateral thigh pain. She has right great toe fracture and was found to be in Rapid Afib which is a new diagnosis for her.   She has been started on BID PO metoprolol by cardiology and still on IV diltiazem drip at 5mg /hr. Family at bedside, patient is comfortable, denies chest pain, dizziness. She developed a sensation of swollen tongue last night, but says it is stable, denies trouble swallowing, shortness of breath, pain/throat pain or stridor.  Subjective Complained of shortness of breath and increased weakness when working with physical therapy today  Assessment/Plan: Atrial fibrillation with rapid ventricular response [facility]): Patient was noted to have onset atrial fibrillation with heart rates up to 135 earlier this admission and required Cardizem drip. - Cardizem drip has since been discontinued and patient remains on oral Cardizem - Cardiology service is following - Heart rate stable this morning - Patient continues on metoprolol to 50mg  BID as tolerated - digoxin was recently added per Cardiology - Patient is also continued on ASA 325 mg daily - 2d echo was was reviewed and found to be unremarkable - Continuing to hold full anticoagulation given history of falls  Right great toe fracture and bilateral thigh pain:  there was no evidence of hip Fx by X-ray. - Will continue pain control: Continue morphine prn and percocet - Will continue Robaxin for muscle spasm as tolerated - On 9/25, patient was seen by Dr. Sharol Given who had recommended weightbearing with postop boot  HLD: Last LDL was 111/15/16. Patient was on  Zocor, but had not been taking -Patient will be continued on home medications as tolerated  HTN: -Patient to be continued on PO Cardizem and PO metoprolol at 50 mg twice daily -Cozaar and Lasix now on hold per cardiology  Frequent falls:  -Suspect related to generalized deconditioning as well as morbid obesity -Plan to continue weightbearing as tolerated per orthopedic recommendations.  -Therapy has recommended SNF at discharge  Depression and anxiety:  overall, remains stable, no suicidal or homicidal ideations. -Continued on home medications including zoloft  Suspected acute bronchitis versus hospital-acquired pneumonia -Recent cxr with new hilar airspace disease concern for possible infectious process. -Patient remains on bronchodilators and IV steroids -Patient is continued on empiric azithromycin and Ceftin which should cover for pneumonia. -Remains difficult to wean O2 (see below)  Acutely decompensated diastolic CHF -Patient remains O2 dependent -Patient was recently observed to snore heavily while sleeping. Strongly suspect underlying an undiagnosed obstructive sleep apnea with obesity hypoventilation. -Will strongly recommend outpatient sleep study after hospital discharge -BUN elevated, serum bicarbonate elevated suggesting contraction alkalosis. Thus, Lasix remains on hold -With the static vital signs were obtained. 13 mm drop in systolic blood pressure noted from sitting to standing -We'll defer possibility of IV fluids per cardiology recommendations  Acute hypoxia -Patient is O2 naive prior to admission however did prior nasal cannula approximately 1-2 years prior -Suspect obesity hypoventilation is a contributing factor -Continue to attempt weaning O2 as tolerated -recommend sleep study per above after hospital discharge  Acute Chest pain  - New issue today and noted by nursing staff - I've ordered a stat EKG and person reviewed. No ischemic changes were  evident - Stat troponin has  been ordered. Results reviewed. Troponin of less than 0.03 - Cardiology or any following. Will defer further recommendations per cardiology  DVT ppx: SCD Code Status: Full code  Family Communication: Yes, patient's family at bed side Disposition Plan:   plan skilled nursing facility at time of discharge.  Consults called: Cardiology, Orthopedics  Admission status:  SDU/inpatient  DVT Prophylaxis: SCDs in case of surgical intervention   Objective: Vitals:   08/08/16 1506 08/08/16 2106 08/09/16 0615 08/09/16 1023  BP: 107/67 118/75 121/74 118/61  Pulse: 80 98 86 94  Resp: 19 18 18 19   Temp: 97.5 F (36.4 C) 98.1 F (36.7 C) 98 F (36.7 C) 98.3 F (36.8 C)  TempSrc: Oral Oral Oral Oral  SpO2: 97% 94% 92% 91%  Weight:   130.8 kg (288 lb 5.8 oz)   Height:        Intake/Output Summary (Last 24 hours) at 08/09/16 1349 Last data filed at 08/09/16 0615  Gross per 24 hour  Intake              480 ml  Output             1300 ml  Net             -820 ml   Filed Weights   08/06/16 0447 08/08/16 0623 08/09/16 0615  Weight: 130.5 kg (287 lb 11.2 oz) 129 kg (284 lb 6.3 oz) 130.8 kg (288 lb 5.8 oz)     Exam: General: Lying bed, eating breakfast in no distress Neck: Trachea midline, neck is supple Cardiovascular: Irregular rhythm, regular rate, S1-S2, difficult to assess JVD Respiratory: Normal respiratory effort, no active wheezing Abdomen: Nondistended, nontender Skin: Normal skin turgor, no pallor Musculoskeletal: No clubbing, no cyanosis Psychiatric: Normal mood, no visual hallucinations or auditory hallucinations Neurologic: Cranial nerves II through XII grossly intact, sensation intact   Data Reviewed: CBC:  Recent Labs Lab 08/03/16 0401 08/04/16 0455 08/05/16 0507 08/06/16 0508 08/07/16 0511  WBC 6.3 5.6 4.9 12.0* 13.0*  HGB 11.0* 11.6* 11.8* 12.7 12.4  HCT 34.0* 36.5 36.5 35.8* 38.3  MCV 99.7 100.3* 99.2 95.2 98.2  PLT 190  211 242 287 [id number]   Basic Metabolic Panel:  Recent Labs Lab 08/03/16 0401  08/05/16 0507 08/06/16 0508 08/07/16 0511 08/08/16 0428 08/09/16 0328  NA 138  < > 138 138 139 138 138  K 4.1  < > 4.2 3.6 3.7 3.8 4.6  CL 101  < > 100* 100* 97* 97* 98*  CO2 30  < > 32 33* 35* 35* 35*  GLUCOSE 100*  < > 131* 140* 124* 132* 142*  BUN 21*  < > 17 22* 25* 32* 30*  CREATININE 0.78  < > 0.56 0.64 0.60 0.68 0.76  CALCIUM 8.5*  < > 8.5* 8.5* 8.4* 8.5* 8.8*  MG 2.0  --   --   --   --   --   --   < > = values in this interval not displayed. GFR: Estimated Creatinine Clearance: 68.1 mL/min (by C-G formula based on SCr of 0.76 mg/dL). Liver Function Tests: No results for input(s): AST, ALT, ALKPHOS, BILITOT, PROT, ALBUMIN in the last 168 hours. No results for input(s): LIPASE, AMYLASE in the last 168 hours. No results for input(s): AMMONIA in the last 168 hours. Coagulation Profile: No results for input(s): INR, PROTIME in the last 168 hours. Cardiac Enzymes:  Recent Labs Lab 08/09/16 1046  TROPONINI <0.03   BNP (last 3 results) No  results for input(s): PROBNP in the last 8760 hours. HbA1C: No results for input(s): HGBA1C in the last 72 hours. CBG:  Recent Labs Lab 08/05/16 0728 08/06/16 0820 08/07/16 0727 08/08/16 0737 08/09/16 0736  GLUCAP 127* 145* 135* 111* 129*   Lipid Profile: No results for input(s): CHOL, HDL, LDLCALC, TRIG, CHOLHDL, LDLDIRECT in the last 72 hours. Thyroid Function Tests: No results for input(s): TSH, T4TOTAL, FREET4, T3FREE, THYROIDAB in the last 72 hours. Anemia Panel: No results for input(s): VITAMINB12, FOLATE, FERRITIN, TIBC, IRON, RETICCTPCT in the last 72 hours. Urine analysis:    Component Value Date/Time   COLORURINE AMBER (A) 08/02/2016 0934   APPEARANCEUR CLOUDY (A) 08/02/2016 0934   LABSPEC 1.023 08/02/2016 0934   PHURINE 7.5 08/02/2016 0934   GLUCOSEU NEGATIVE 08/02/2016 0934   HGBUR NEGATIVE 08/02/2016 0934   HGBUR negative  09/23/2010 0825   BILIRUBINUR NEGATIVE 08/02/2016 0934   BILIRUBINUR Neg 02/11/2016 1150   KETONESUR NEGATIVE 08/02/2016 0934   PROTEINUR NEGATIVE 08/02/2016 0934   UROBILINOGEN 2.0 02/11/2016 1150   UROBILINOGEN negative 09/23/2010 0825   NITRITE POSITIVE (A) 08/02/2016 0934   LEUKOCYTESUR LARGE (A) 08/02/2016 0934   Sepsis Labs: @LABRCNTIP (procalcitonin:4,lacticidven:4)  ) Recent Results (from the past 240 hour(s))  MRSA PCR Screening     Status: None   Collection Time: 08/01/16  4:53 AM  Result Value Ref Range Status   MRSA by PCR NEGATIVE NEGATIVE Final    Comment:        The GeneXpert MRSA Assay (FDA approved for NASAL specimens only), is one component of a comprehensive MRSA colonization surveillance program. It is not intended to diagnose MRSA infection nor to guide or monitor treatment for MRSA infections.      Studies: No results found.  Scheduled Meds: . aspirin EC  325 mg Oral Daily  . cefUROXime  250 mg Oral BID WC  . digoxin  0.125 mg Oral Daily  . diltiazem  120 mg Oral Q12H  . fluticasone  1 spray Each Nare Daily  . methylPREDNISolone (SOLU-MEDROL) injection  40 mg Intravenous Q12H  . metoprolol tartrate  50 mg Oral BID  . multivitamin with minerals  1 tablet Oral Daily  . omega-3 acid ethyl esters  1 g Oral Daily  . sertraline  50 mg Oral Daily    Continuous Infusions:     LOS: 8 days    CHIU, Orpah Melter, MD [facility] Pager [phone] If 7PM-7AM, please contact night-coverage [email or web address] Password [email or web address] 08/09/2016, 1:49 PM

## 2016-08-09 NOTE — Progress Notes (Signed)
Patient c/o sudden "persistent" left sided chest "pressure" under her left breast, also with a burning feeling to the left side of her back. Patient states pressure feeling is rated a 5 out of 10. Denies N/V or diaphoresis. Patient is resting in bed, no signs of distress. EKG performed. MD Wyline Copas paged to be made aware. Troponins ordered. Will continue to monitor.

## 2016-08-09 NOTE — Progress Notes (Signed)
Patient refuses to ambulate with staff this shift. Multiple attempts made. Patient was able to get OOB to [facility] x3 with assistance and tolerated well- O2 Sats 88% RA up to [facility]. FC removed as ordered and patient has voided multiple times without difficulty.

## 2016-08-09 NOTE — Clinical Social Work Placement (Signed)
CSW provided SNF bed offers to patient's daughter-in-law, Shukri Onder (cell#: [phone]) who plans to tour facilities & get back to [place] with SNF decision.   Raynaldo Opitz, [facility] Clinical Social Worker cell #: [phone]     CLINICAL SOCIAL WORK PLACEMENT  NOTE  Date:  08/09/2016  Patient Details  Name: Norma Ford MRN: [id number] Date of Birth: [birth year]  Clinical Social Work is seeking post-discharge placement for this patient at the [place] level of care (*CSW will initial, date and re-position this form in  chart as items are completed):  Yes   Patient/family provided with [place] Work Department's list of facilities offering this level of care within the geographic area requested by the patient (or if unable, by the patient's family).  Yes   Patient/family informed of their freedom to choose among providers that offer the needed level of care, that participate in Medicare, Medicaid or managed care program needed by the patient, have an available bed and are willing to accept the patient.  Yes   Patient/family informed of [facility]'s ownership interest in [facility] and [facility], as well as of the fact that they are under no obligation to receive care at these facilities.  PASRR submitted to EDS on 08/09/16     PASRR number received on 08/09/16     Existing PASRR number confirmed on       FL2 transmitted to all facilities in geographic area requested by pt/family on 08/09/16     FL2 transmitted to all facilities within larger geographic area on       Patient informed that his/her managed care company has contracts with or will negotiate with certain facilities, including the following:        Yes   Patient/family informed of bed offers received.  Patient chooses bed at       Physician recommends and patient chooses bed at      Patient to be transferred to   on  .  Patient to be  transferred to facility by       Patient family notified on   of transfer.  Name of family member notified:        PHYSICIAN       Additional Comment:    _______________________________________________ Standley Brooking, LCSW 08/09/2016, 3:47 PM

## 2016-08-09 NOTE — Progress Notes (Signed)
Patient Name: Norma Ford Date of Encounter: 08/09/2016  Primary Cardiologist: Joaquim Nam, MD   Hospital Problem List     Principal Problem:   Atrial fibrillation with rapid ventricular response [facility]) Active Problems:   Essential hypertension   Morbid obesity ([place])   History of fall   Frequent falls   Thigh pain   Toe fracture, right   Depression   Acute diastolic CHF     Subjective   Dizzy (orthostatic) yesterday when she got up.    Inpatient Medications    . aspirin EC  325 mg Oral Daily  . cefUROXime  250 mg Oral BID WC  . digoxin  0.125 mg Oral Daily  . diltiazem  120 mg Oral Q12H  . fluticasone  1 spray Each Nare Daily  . methylPREDNISolone (SOLU-MEDROL) injection  40 mg Intravenous Q12H  . metoprolol tartrate  50 mg Oral BID  . multivitamin with minerals  1 tablet Oral Daily  . omega-3 acid ethyl esters  1 g Oral Daily  . sertraline  50 mg Oral Daily    Vital Signs    Vitals:   08/08/16 1100 08/08/16 1506 08/08/16 2106 08/09/16 0615  BP: (!) 143/74 107/67 118/75 121/74  Pulse:  80 98 86  Resp:  19 18 18   Temp:  97.5 F (36.4 C) 98.1 F (36.7 C) 98 F (36.7 C)  TempSrc:  Oral Oral Oral  SpO2:  97% 94% 92%  Weight:    288 lb 5.8 oz (130.8 kg)  Height:        Intake/Output Summary (Last 24 hours) at 08/09/16 0826 Last data filed at 08/09/16 0615  Gross per 24 hour  Intake              720 ml  Output             1300 ml  Net             -580 ml   Filed Weights   08/06/16 0447 08/08/16 0623 08/09/16 0615  Weight: 287 lb 11.2 oz (130.5 kg) 284 lb 6.3 oz (129 kg) 288 lb 5.8 oz (130.8 kg)    Physical Exam   GEN: Morbidly obese, on O2,  in no acute distress.  HEENT: Grossly normal.  Neck: Supple, obese, difficult to gauge JVP, no carotid bruits, or masses. Cardiac: IR, IR, no murmurs, rubs, or gallops. No clubbing, cyanosis, 1+ bilat LE edema. Respiratory:  Decreased at bases GI: Obese, soft, nontender, nondistended, BS + x 4. MS: no  deformity or atrophy. Skin: warm and dry, no rash. Neuro:  Strength and sensation are intact. Psych: AAOx3.  Normal affect.  Labs    CBC  Recent Labs  08/07/16 0511  WBC 13.0*  HGB 12.4  HCT 38.3  MCV 98.2  PLT Q000111Q   Basic Metabolic Panel  Recent Labs  08/08/16 0428 08/09/16 0328  NA 138 138  K 3.8 4.6  CL 97* 98*  CO2 35* 35*  GLUCOSE 132* 142*  BUN 32* 30*  CREATININE 0.68 0.76  CALCIUM 8.5* 8.8*    Telemetry    Afib - 70-90, 3 bt WCT  Radiology   2D Echocardiogram 9.25.2017 ------------------------------------------------------------------- Study Conclusions   - Left ventricle: The cavity size was normal. Wall thickness was   increased in a pattern of mild LVH. Systolic function was normal.   The estimated ejection fraction was in the range of 60% to 65%.  Patient Profile     82  y/o morbidly obese female, sedentary secondary to DJD, with h/o hypertension, falls, adrenal tumor, and Barrett's esophagus, who presented to [facility] 07/31/16 after sustaining a mechanical fall resulting in right great toe Fx. Found to be in new onset atrial fibrillation w/ RVR. She also has suspected combined pneumonia and diastolic CHF.   Assessment & Plan    1.  R Great Toe Fx/Fall:  No significant pain.  Wearing post-op shoe.  Ortho has prev seen. She tells me she is going to rehab, I have yet to see her out of bed.   2.  Afib RVR:  In setting of above.  Unclear of time of onset.  Pt noted fatigue a few days prior to admission bu was not having palpitations/c/p/dyspnea. Despite CHA2DS2VASc of 4, she is not on [place] 2/2 h/o multiple falls.  No plans for dccv @ this time.  Rate controlled- metoprolol to 50 BID Continue dilt 120 CD, Digoxin 0.125 mg.  MD does not feel strongly about continuing  Digoxin  long term but if it helps with rate control , we can continue.   3.  Essential HTN:  Now orthostatic, suspect she is mildly dehydrated after her Lasix was increased for a few doses  last week. Lasix has been on hold last few days.  4.  Acute diastolic CHF on admission- she has diuresed 11.0 L   Is not requiring lasix at present but will probably need home dose. Will contiinue to evaluate.   5.  Morbid obesity- BMI 53. Make volume assessment difficult. BNP was only 177 on 9/25.  Suspected sleep apnea, consider sleep study at some point as an OP.   Plan: Lasix currently on hold. Will stop Cozaar. Discussed with Dr Wyline Copas- BUN down to 30 but CO2 still suggests some degree of contraction alkalosis. Check orthostatic B/P today.   F/U with Dr Audrea Muscat will be arranged at discharge.      Kerin Ransom, [place]  80/01/2016 8:26 AM    [facility]  Personally seen and examined. Agree with above.  Given morbid obesity, challenging to assess fluid status. Holding lasix, ARB. ?contraction alkalosis.   Candee Furbish, MD

## 2016-08-09 NOTE — Progress Notes (Signed)
OT Cancellation Note  Patient Details Name: Norma Ford MRN: [id number] DOB: 1930-10-11   Cancelled Treatment:    Reason Eval/Treat Not Completed: Other (comment).  Pt had just gotten back to bed and was fatiqued. Will check back  Tejah Brekke 08/09/2016, 3:00 PM  Lesle Chris, OTR/L [id number] 08/09/2016

## 2016-08-10 DIAGNOSIS — I10 Essential (primary) hypertension: Secondary | ICD-10-CM | POA: Diagnosis not present

## 2016-08-10 DIAGNOSIS — I110 Hypertensive heart disease with heart failure: Secondary | ICD-10-CM | POA: Diagnosis not present

## 2016-08-10 DIAGNOSIS — R509 Fever, unspecified: Secondary | ICD-10-CM | POA: Diagnosis not present

## 2016-08-10 DIAGNOSIS — S92404D Nondisplaced unspecified fracture of right great toe, subsequent encounter for fracture with routine healing: Secondary | ICD-10-CM | POA: Diagnosis not present

## 2016-08-10 DIAGNOSIS — I482 Chronic atrial fibrillation: Secondary | ICD-10-CM | POA: Diagnosis not present

## 2016-08-10 DIAGNOSIS — Z96653 Presence of artificial knee joint, bilateral: Secondary | ICD-10-CM | POA: Diagnosis not present

## 2016-08-10 DIAGNOSIS — J9601 Acute respiratory failure with hypoxia: Secondary | ICD-10-CM | POA: Diagnosis not present

## 2016-08-10 DIAGNOSIS — B370 Candidal stomatitis: Secondary | ICD-10-CM | POA: Diagnosis not present

## 2016-08-10 DIAGNOSIS — Z7982 Long term (current) use of aspirin: Secondary | ICD-10-CM | POA: Diagnosis not present

## 2016-08-10 DIAGNOSIS — E785 Hyperlipidemia, unspecified: Secondary | ICD-10-CM | POA: Diagnosis not present

## 2016-08-10 DIAGNOSIS — G8929 Other chronic pain: Secondary | ICD-10-CM | POA: Diagnosis not present

## 2016-08-10 DIAGNOSIS — D6959 Other secondary thrombocytopenia: Secondary | ICD-10-CM | POA: Diagnosis not present

## 2016-08-10 DIAGNOSIS — Z66 Do not resuscitate: Secondary | ICD-10-CM | POA: Diagnosis not present

## 2016-08-10 DIAGNOSIS — Z6841 Body Mass Index (BMI) 40.0 and over, adult: Secondary | ICD-10-CM | POA: Diagnosis not present

## 2016-08-10 DIAGNOSIS — R262 Difficulty in walking, not elsewhere classified: Secondary | ICD-10-CM | POA: Diagnosis not present

## 2016-08-10 DIAGNOSIS — Z8249 Family history of ischemic heart disease and other diseases of the circulatory system: Secondary | ICD-10-CM | POA: Diagnosis not present

## 2016-08-10 DIAGNOSIS — S92401D Displaced unspecified fracture of right great toe, subsequent encounter for fracture with routine healing: Secondary | ICD-10-CM | POA: Diagnosis not present

## 2016-08-10 DIAGNOSIS — M810 Age-related osteoporosis without current pathological fracture: Secondary | ICD-10-CM | POA: Diagnosis not present

## 2016-08-10 DIAGNOSIS — L03115 Cellulitis of right lower limb: Secondary | ICD-10-CM | POA: Diagnosis not present

## 2016-08-10 DIAGNOSIS — M79661 Pain in right lower leg: Secondary | ICD-10-CM | POA: Diagnosis present

## 2016-08-10 DIAGNOSIS — M79659 Pain in unspecified thigh: Secondary | ICD-10-CM | POA: Diagnosis not present

## 2016-08-10 DIAGNOSIS — L03319 Cellulitis of trunk, unspecified: Secondary | ICD-10-CM | POA: Diagnosis not present

## 2016-08-10 DIAGNOSIS — M1710 Unilateral primary osteoarthritis, unspecified knee: Secondary | ICD-10-CM | POA: Diagnosis not present

## 2016-08-10 DIAGNOSIS — B9689 Other specified bacterial agents as the cause of diseases classified elsewhere: Secondary | ICD-10-CM | POA: Diagnosis not present

## 2016-08-10 DIAGNOSIS — Z79899 Other long term (current) drug therapy: Secondary | ICD-10-CM | POA: Diagnosis not present

## 2016-08-10 DIAGNOSIS — Z7189 Other specified counseling: Secondary | ICD-10-CM | POA: Diagnosis not present

## 2016-08-10 DIAGNOSIS — J189 Pneumonia, unspecified organism: Secondary | ICD-10-CM | POA: Diagnosis not present

## 2016-08-10 DIAGNOSIS — M25561 Pain in right knee: Secondary | ICD-10-CM | POA: Diagnosis not present

## 2016-08-10 DIAGNOSIS — Z841 Family history of disorders of kidney and ureter: Secondary | ICD-10-CM | POA: Diagnosis not present

## 2016-08-10 DIAGNOSIS — I5032 Chronic diastolic (congestive) heart failure: Secondary | ICD-10-CM | POA: Diagnosis not present

## 2016-08-10 DIAGNOSIS — I5033 Acute on chronic diastolic (congestive) heart failure: Secondary | ICD-10-CM | POA: Diagnosis not present

## 2016-08-10 DIAGNOSIS — R600 Localized edema: Secondary | ICD-10-CM | POA: Diagnosis not present

## 2016-08-10 DIAGNOSIS — I4891 Unspecified atrial fibrillation: Secondary | ICD-10-CM | POA: Diagnosis not present

## 2016-08-10 DIAGNOSIS — K5903 Drug induced constipation: Secondary | ICD-10-CM | POA: Diagnosis not present

## 2016-08-10 DIAGNOSIS — M6281 Muscle weakness (generalized): Secondary | ICD-10-CM | POA: Diagnosis not present

## 2016-08-10 DIAGNOSIS — J42 Unspecified chronic bronchitis: Secondary | ICD-10-CM | POA: Diagnosis not present

## 2016-08-10 DIAGNOSIS — Z9981 Dependence on supplemental oxygen: Secondary | ICD-10-CM | POA: Diagnosis not present

## 2016-08-10 DIAGNOSIS — T402X5A Adverse effect of other opioids, initial encounter: Secondary | ICD-10-CM | POA: Diagnosis not present

## 2016-08-10 DIAGNOSIS — I480 Paroxysmal atrial fibrillation: Secondary | ICD-10-CM | POA: Diagnosis not present

## 2016-08-10 DIAGNOSIS — R296 Repeated falls: Secondary | ICD-10-CM | POA: Diagnosis not present

## 2016-08-10 DIAGNOSIS — Z515 Encounter for palliative care: Secondary | ICD-10-CM | POA: Diagnosis not present

## 2016-08-10 DIAGNOSIS — Z9181 History of falling: Secondary | ICD-10-CM | POA: Diagnosis not present

## 2016-08-10 LAB — BASIC METABOLIC PANEL
ANION GAP: 7 (ref 5–15)
BUN: 36 mg/dL — AB (ref 4–21)
BUN: 36 mg/dL — AB (ref 6–20)
CHLORIDE: 98 mmol/L — AB (ref 101–111)
CO2: 32 mmol/L (ref 22–32)
Calcium: 8.5 mg/dL — ABNORMAL LOW (ref 8.9–10.3)
Creatinine, Ser: 0.79 mg/dL (ref 0.44–1.00)
Creatinine: 0.8 mg/dL (ref 0.5–1.1)
GFR calc Af Amer: >60 mL/min (ref >60)
GLUCOSE: 129 mg/dL
GLUCOSE: 129 mg/dL — AB (ref 65–99)
POTASSIUM: 4.5 mmol/L (ref 3.5–5.1)
Sodium: 137 mmol/L (ref 135–145)
Sodium: 137 mmol/L (ref 137–147)

## 2016-08-10 LAB — GLUCOSE, CAPILLARY: Glucose-Capillary: 125 mg/dL — ABNORMAL HIGH (ref 65–99)

## 2016-08-10 MED ORDER — PREDNISONE 20 MG PO TABS: 20.0000 mg | ORAL_TABLET | Freq: Every day | ORAL | Status: DC

## 2016-08-10 MED ORDER — DILTIAZEM HCL ER 120 MG PO CP12: 120.0000 mg | ORAL_CAPSULE | Freq: Two times a day (BID) | ORAL | 0 refills | Status: DC

## 2016-08-10 MED ORDER — ALPRAZOLAM 0.25 MG PO TABS: 0.2500 mg | ORAL_TABLET | Freq: Two times a day (BID) | ORAL | 0 refills | Status: AC | PRN

## 2016-08-10 MED ORDER — ASPIRIN 325 MG PO TBEC: 325.0000 mg | DELAYED_RELEASE_TABLET | Freq: Every day | ORAL | 0 refills | Status: DC

## 2016-08-10 MED ORDER — METOPROLOL TARTRATE 50 MG PO TABS: 50.0000 mg | ORAL_TABLET | Freq: Two times a day (BID) | ORAL | 0 refills | Status: AC

## 2016-08-10 MED ORDER — FUROSEMIDE 40 MG PO TABS: 40.0000 mg | ORAL_TABLET | Freq: Every day | ORAL | 0 refills | Status: AC

## 2016-08-10 MED ORDER — POTASSIUM CHLORIDE CRYS ER 20 MEQ PO TBCR: 20.0000 meq | EXTENDED_RELEASE_TABLET | Freq: Every day | ORAL | 0 refills | Status: AC

## 2016-08-10 MED ORDER — HYDROCODONE-ACETAMINOPHEN 5-325 MG PO TABS: 1.0000 | ORAL_TABLET | ORAL | 0 refills | Status: DC | PRN

## 2016-08-10 MED ORDER — PREDNISONE 20 MG PO TABS: 20.0000 mg | ORAL_TABLET | Freq: Every day | ORAL | 0 refills | Status: DC

## 2016-08-10 NOTE — Progress Notes (Signed)
Pt is very pleasant. Spoke with pt and sister at bedside. Plan to dc to SNF.

## 2016-08-10 NOTE — Progress Notes (Addendum)
Patient Name: Norma Ford Date of Encounter: 08/10/2016  Primary Cardiologist: Joaquim Nam, MD   Hospital Problem List     Principal Problem:   Atrial fibrillation with rapid ventricular response [name]) Active Problems:   Essential hypertension   Morbid obesity ([name])   History of fall   Frequent falls   Thigh pain   Toe fracture, right   Depression   Acute diastolic CHF   Closed fracture of phalanx of toe of right foot, initial encounter     Subjective   Didn't get up last PM-"afraid I was going to fall"   Inpatient Medications    . aspirin EC  325 mg Oral Daily  . cefUROXime  250 mg Oral BID WC  . digoxin  0.125 mg Oral Daily  . diltiazem  120 mg Oral Q12H  . fluticasone  1 spray Each Nare Daily  . methylPREDNISolone (SOLU-MEDROL) injection  40 mg Intravenous Q12H  . metoprolol tartrate  50 mg Oral BID  . multivitamin with minerals  1 tablet Oral Daily  . omega-3 acid ethyl esters  1 g Oral Daily  . sertraline  50 mg Oral Daily    Vital Signs    Vitals:   08/09/16 0615 08/09/16 1023 08/09/16 2055 08/10/16 0620  BP: 121/74 118/61 (!) 140/58 136/81  Pulse: 86 94 92 82  Resp: 18 19 18 17   Temp: 98 F (36.7 C) 98.3 F (36.8 C) 97.5 F (36.4 C) 97.6 F (36.4 C)  TempSrc: Oral Oral Oral Oral  SpO2: 92% 91% 95% 94%  Weight: 288 lb 5.8 oz (130.8 kg)   286 lb 6 oz (129.9 kg)  Height:        Intake/Output Summary (Last 24 hours) at 08/10/16 0821 Last data filed at 08/09/16 1730  Gross per 24 hour  Intake              720 ml  Output                0 ml  Net              720 ml   Filed Weights   08/08/16 0623 08/09/16 0615 08/10/16 0620  Weight: 284 lb 6.3 oz (129 kg) 288 lb 5.8 oz (130.8 kg) 286 lb 6 oz (129.9 kg)    Physical Exam   GEN: Morbidly obese, on O2,  in no acute distress.  HEENT: Grossly normal.  Neck: Supple, obese, difficult to gauge JVP, no carotid bruits, or masses. Cardiac: IR, IR, no murmurs, rubs, or gallops. No clubbing,  cyanosis, 1+ bilat LE edema. Respiratory:  Decreased at bases GI: Obese, soft, nontender, nondistended, BS + x 4. MS: no deformity or atrophy. Skin: warm and dry, no rash. Neuro:  Strength and sensation are intact. Psych: AAOx3.  Normal affect.  Labs    CBC No results for input(s): WBC, NEUTROABS, HGB, HCT, MCV, PLT in the last 72 hours. Basic Metabolic Panel  Recent Labs  08/09/16 0328 08/10/16 0523  NA 138 137  K 4.6 4.5  CL 98* 98*  CO2 35* 32  GLUCOSE 142* 129*  BUN 30* 36*  CREATININE 0.76 0.79  CALCIUM 8.8* 8.5*    Telemetry    Afib - 70-90, 3 bt WCT  Radiology   2D Echocardiogram 9.25.2017 ------------------------------------------------------------------- Study Conclusions   - Left ventricle: The cavity size was normal. Wall thickness was   increased in a pattern of mild LVH. Systolic function was normal.   The  estimated ejection fraction was in the range of 60% to 65%.  Patient Profile     82 y/o morbidly obese female, sedentary secondary to DJD, with h/o hypertension, falls, adrenal tumor, and Barrett's esophagus, who presented to [name] 07/31/16 after sustaining a mechanical fall resulting in right great toe Fx. Found to be in new onset atrial fibrillation w/ RVR. She also has suspected combined pneumonia and diastolic CHF.   Assessment & Plan    1.  R Great Toe Fx/Fall:  No significant pain.  Wearing post-op shoe. She is going to rehab when stable.  2.  Afib RVR:  In setting of above.  Unclear of time of onset.  Despite CHA2DS2VASc of 4, she is not on [place] 2/2 h/o multiple falls.  No plans for dccv @ this time.  Rate controlled- metoprolol to 50 BID Continue dilt 120 CD, Digoxin 0.125 mg.   3.  Essential HTN:  Cozaar stopped secondary to suspected orthostatic B/P in setting of mild dehydration from aggressive diuresis (though orthostatic B/P was never documented).   4.  Acute diastolic CHF on admission- she has diuresed 10.3 L   She is not requiring  lasix at present but will probably need home dose. Will contiinue to evaluate.   5.  Morbid obesity with hypoventilation syndrome and sleep apnea, BMI 53. Makes volume assessment difficult. BNP was only 177 on 9/25.    Plan: Lasix still on hold- volume status appears stable- flat in bed, no rales on exam. Elevated BUN probably from steroids.   I'm not sure she needs Lanoxin long term-will discuss with MD.    F/U with Dr Audrea Muscat will be arranged at discharge.      Kerin Ransom, PA-C  08/10/2016 8:21 AM    [facility]  Personally seen and examined. Agree with above.  Will stop Dig When DC'd ok with lasix 40 PO QD Improved, OK for DC Candee Furbish, MD

## 2016-08-10 NOTE — Discharge Summary (Signed)
Discharge Summary  Norma Ford [id number] DOB: [birth year]  PCP: Ann Held, DO  Admit date: 07/31/2016 Discharge date: 08/10/2016   Recommendations for Outpatient Follow-up:  1. PCP 2-3 weeks 2. Cardiology 2 weeks   Discharge Diagnoses:  Active Hospital Problems   Diagnosis Date Noted  . Atrial fibrillation with rapid ventricular response ([place]) 08/01/2016  . Closed fracture of phalanx of toe of right foot, initial encounter   . Acute diastolic CHF 0000000  . Thigh pain 08/01/2016  . Toe fracture, right 08/01/2016  . Depression 08/01/2016  . Frequent falls 02/14/2016  . History of fall 11/14/2015  . Morbid obesity ([place]a [place]) 02/24/2014  . Essential hypertension 02/12/2008    Resolved Hospital Problems   Diagnosis Date Noted Date Resolved  No resolved problems to display.    Discharge Condition: Stable   Diet recommendation: Cardiac   Vitals:   08/10/16 0620 08/10/16 1115  BP: 136/81 122/77  Pulse: 82 72  Resp: 17   Temp: 97.6 F (36.4 C)     HPI and Brief Hospital Course:  82 y.o.femalewith medical history significant of hypertension, hyperlipidemia, depression, adrenal tumor, Barrett's esophagus, mycosis fungoides lymph nodes, morbid obesity, frequent fall, who presented with fall, right great toe pain, bilateral thigh pain. She has right great toe fracture and was found to be in Rapid Afib which is a new diagnosis for her. Was seen by ortho who recommends ambulation in postop boot.  She was been started on BID PO metoprolol by cardiology and IV diltiazem drip at 5mg /hr which has been converted to PO. Family at bedside, patient is comfortable, denies chest pain, dizziness. She was started on PO diltiazem but decision was made not to continue this. Due to frequent falls with injury, she is not an anticoagulation candidate.  Discharge details, plan of care and follow up instructions were discussed with patient and any available family or care  providers. Patient and family are in agreement with discharge from the hospital today and all questions were answered to their satisfaction.  Details of Hospital Course:  Atrial fibrillation with rapid ventricular response [facility]): Patient was noted to have onset atrial fibrillation with heart rates up to 135 earlier this admission and required Cardizem drip. - Cardizem drip has since been discontinued and patient remains on oral Cardizem - Cardiology service is following - Heart rate stable this morning - Patient continues on metoprolol to 50mg  BID as tolerated - digoxin was recently added per Cardiology but will not be continued at discharge - Patient is also continued on ASA 325 mg daily - 2d echo was was reviewed and found to be unremarkable - Continuing to hold full anticoagulation given history of falls  Right great toe fracture and bilateral thigh pain: there was no evidence of hip Fx by X-ray. - Will continue pain control: Continue morphine prn and percocet - Will continue Robaxin for muscle spasm as tolerated - On 9/25, patient was seen by Dr. Sharol Given who had recommended weightbearing with postop boot  [id number] LDL was 111/15/16. Patient was on Zocor, but had not been taking -Patient will be continued on home medications as tolerated  HTN: -Patient to be continued on PO Cardizem and PO metoprolol at 50 mg twice daily -Cozaar and Lasix on hold per cardiology, will restart daily lasix (down from BID) at discharge with K supplementation  Frequent falls:  -Suspect related to generalized deconditioning as well as morbid obesity -Plan to continue weightbearing as tolerated per orthopedic recommendations.  -Therapy has  recommended SNF at discharge  Depression and anxiety: overall, remains stable, no suicidal or homicidal ideations. -Continued on home medications including zoloft  Suspected acute bronchitis versus hospital-acquired pneumonia -Recent cxr with new hilar airspace  disease concern for possible infectious process. -Patient remains on bronchodilators and IV steroids -Patient is continued on empiric azithromycin and Ceftin which should cover for pneumonia. -Remains difficult to wean O2 (see below)  Acutely decompensated diastolic CHF -Patient remains O2 dependent -Patient was recently observed to snore heavily while sleeping. Strongly suspect underlying an undiagnosed obstructive sleep apnea with obesity hypoventilation. -Will strongly recommend outpatient sleep study after hospital discharge -BUN elevated, serum bicarbonate elevated suggesting contraction alkalosis. Thus, Lasix was on hold and dose reduced  Acute hypoxia -Patient is O2 naive prior to admission however did prior nasal cannula approximately 1-2 years prior -Suspect obesity hypoventilation is a contributing factor -Continue to attempt weaning O2 as tolerated -recommend sleep study per above after hospital discharge  Procedures:  None   Consultations:  Cardiology   Discharge Exam: BP 122/77   Pulse 72   Temp 97.6 F (36.4 C) (Oral)   Resp 17   Ht 5\' 2"  (1.575 m)   Wt 129.9 kg (286 lb 6 oz)   SpO2 94%   BMI 52.38 kg/m  General:  Alert, oriented, calm, in no acute distress  Eyes: EOMI, clear sclerea Neck: supple, no masses, trachea mildline  Cardiovascular: RRR, no murmurs or rubs, no peripheral edema  Respiratory: clear to auscultation bilaterally, no wheezes, no crackles  Abdomen: soft, nontender, nondistended, normal bowel tones heard  Skin: dry, no rashes  Musculoskeletal: no joint effusions, normal range of motion  Psychiatric: appropriate affect, normal speech  Neurologic: extraocular muscles intact, clear speech, moving all extremities with intact sensorium    Discharge Instructions You were cared for by a hospitalist during your hospital stay. If you have any questions about your discharge medications or the care you received while you were in the hospital  after you are discharged, you can call the unit and asked to speak with the hospitalist on call if the hospitalist that took care of you is not available. Once you are discharged, your primary care physician will handle any further medical issues. Please note that NO REFILLS for any discharge medications will be authorized once you are discharged, as it is imperative that you return to your primary care physician (or establish a relationship with a primary care physician if you do not have one) for your aftercare needs so that they can reassess your need for medications and monitor your lab values.  Discharge Instructions    Diet - low sodium heart healthy    Complete by:  As directed    Increase activity slowly    Complete by:  As directed        Medication List    STOP taking these medications   losartan 50 MG tablet Commonly known as:  COZAAR   metoprolol succinate 25 MG 24 hr tablet Commonly known as:  TOPROL-XL   traMADol 50 MG tablet Commonly known as:  ULTRAM     TAKE these medications   ALPRAZolam 0.25 MG tablet Commonly known as:  XANAX Take 1 tablet (0.25 mg total) by mouth 2 (two) times daily as needed for anxiety.   aspirin 325 MG EC tablet Take 1 tablet (325 mg total) by mouth daily.   Commode Bedside Misc Bariatric Bedside commode   Bariatric Rollator Misc [employer], [employer] with Seat. Dx:  diltiazem 120 MG 12 hr capsule Commonly known as:  CARDIZEM SR Take 1 capsule (120 mg total) by mouth every 12 (twelve) hours.   FISH OIL PO Take 1 tablet by mouth 2 (two) times daily.   furosemide 40 MG tablet Commonly known as:  LASIX Take 1 tablet (40 mg total) by mouth daily. What changed:  See the new instructions.   HYDROcodone-acetaminophen 5-325 MG tablet Commonly known as:  NORCO/VICODIN Take 1 tablet by mouth every 4 (four) hours as needed for moderate pain. What changed:  when to take this   metoprolol 50 MG tablet Commonly known as:   LOPRESSOR Take 1 tablet (50 mg total) by mouth 2 (two) times daily.   mometasone 50 MCG/ACT nasal spray Commonly known as:  NASONEX Place 2 sprays into the nose daily as needed (for congestion).   multivitamin tablet Take 1 tablet by mouth daily.   NONFORMULARY OR COMPOUNDED ITEM Wheelchair, manual #1  As directed---dx osteoarthritis, frequent falls   NONFORMULARY OR COMPOUNDED ITEM Lift Chair to use at home as needed   polyethylene glycol powder powder Commonly known as:  GLYCOLAX/MIRALAX Take 17 g by mouth 2 (two) times daily as needed.   potassium chloride SA 20 MEQ tablet Commonly known as:  K-DUR,KLOR-CON Take 1 tablet (20 mEq total) by mouth daily. What changed:  See the new instructions.   predniSONE 20 MG tablet Commonly known as:  DELTASONE Take 1 tablet (20 mg total) by mouth daily with breakfast. Start taking on:  08/11/2016   sertraline 50 MG tablet Commonly known as:  ZOLOFT Take 1 tablet by mouth  daily   simvastatin 80 MG tablet Commonly known as:  ZOCOR Take 1 tablet by mouth at  bedtime      Allergies  Allergen Reactions  . Codeine     REACTION: ITCHING   Follow-up Information    Newt Minion, MD Follow up in 1 week(s).   Specialty:  Orthopedic Surgery Contact information: [address] [place] [address] [phone]            The results of significant diagnostics from this hospitalization (including imaging, microbiology, ancillary and laboratory) are listed below for reference.    Significant Diagnostic Studies: Dg Chest Port 1 View  Result Date: 08/04/2016 CLINICAL DATA:  F/u pneumonia; sob and wheezing EXAM: PORTABLE CHEST 1 VIEW COMPARISON:  08/01/2016 FINDINGS: Exam is lordotic. Extremely low lung volumes. There is increased perihilar vascular congestion. New perihilar airspace disease. No pneumothorax IMPRESSION: New perihilar airspace disease suggests pulmonary edema. Low lung volumes the Electronically Signed   By:  Suzy Bouchard M.D.   On: 08/04/2016 08:28   Dg Chest Portable 1 View  Result Date: 08/01/2016 CLINICAL DATA:  Generalized weakness for 2 weeks. Fell at home tonight. Leg pain. History of hypertension, hyperlipidemia. EXAM: PORTABLE CHEST 1 VIEW COMPARISON:  Chest radiograph November 10, 2014 FINDINGS: Cardiac silhouette is mildly enlarged, even with CAD considerations low inspiratory examination. Pulmonary vascular congestion interstitial prominence. Small bilateral pleural effusions. No pneumothorax. Severe degenerative change of the shoulders. IMPRESSION: Mild cardiomegaly.  Interstitial edema with small pleural effusions. Electronically Signed   By: Elon Alas M.D.   On: 08/01/2016 01:51   Dg Foot Complete Right  Result Date: 08/01/2016 CLINICAL DATA:  Acute onset of right foot pain, especially at the great toe, after fall. Initial encounter. EXAM: RIGHT FOOT COMPLETE - 3+ VIEW COMPARISON:  None. FINDINGS: There is a comminuted fracture involving the first proximal phalanx,  with 2 fracture lines extending to the first metatarsophalangeal joint, and mild impaction of a central fragment. No additional fractures are seen. There is no evidence of talar subluxation; the subtalar joint is unremarkable in appearance. A plantar calcaneal spur is noted. Diffuse dorsal soft tissue swelling is noted at the foot, with scattered soft tissue calcifications. IMPRESSION: 1. Comminuted fracture of the first proximal phalanx, with 2 fracture lines extending to the first metatarsophalangeal joint, and mild impaction of a central fragment. 2. Diffuse dorsal soft tissue swelling, with scattered soft tissue calcifications. Electronically Signed   By: Garald Balding M.D.   On: 08/01/2016 01:01   Dg Femur Min 2 Views Left  Result Date: 08/01/2016 CLINICAL DATA:  82 year old female with bilateral leg pain and fall. EXAM: LEFT FEMUR 2 VIEWS COMPARISON:  Right femur radiograph dated 07/31/2016 FINDINGS: There is no  acute fracture or dislocation. The bones are osteopenic. There is a total left knee arthroplasty. There are osteoarthritic changes of the left hip. There is spurring of the superior patella along the insertion of the quadriceps tendon. No significant joint effusion. The soft tissues are grossly unremarkable. IMPRESSION: No acute fracture or dislocation. Electronically Signed   By: Anner Crete M.D.   On: 08/01/2016 00:59   Dg Femur, Min 2 Views Right  Result Date: 08/01/2016 CLINICAL DATA:  Status post fall, with bilateral femur pain. Initial encounter. EXAM: RIGHT FEMUR 2 VIEWS COMPARISON:  None. FINDINGS: There is no evidence of fracture or dislocation. The right femur appears grossly intact. The right hip joint is grossly unremarkable. The patient's right knee arthroplasty is grossly unremarkable in appearance, without evidence of loosening. No definite soft tissue abnormalities are characterized on radiograph. No knee joint effusion is seen. IMPRESSION: No evidence of fracture or dislocation. Right knee arthroplasty is unremarkable in appearance. Electronically Signed   By: Garald Balding M.D.   On: 08/01/2016 01:03    Microbiology: Recent Results (from the past 240 hour(s))  MRSA PCR Screening     Status: None   Collection Time: 08/01/16  4:53 AM  Result Value Ref Range Status   MRSA by PCR NEGATIVE NEGATIVE Final    Comment:        The GeneXpert MRSA Assay (FDA approved for NASAL specimens only), is one component of a comprehensive MRSA colonization surveillance program. It is not intended to diagnose MRSA infection nor to guide or monitor treatment for MRSA infections.      Labs: Basic Metabolic Panel:  Recent Labs Lab 08/06/16 0508 08/07/16 0511 08/08/16 0428 08/09/16 0328 08/10/16 0523  NA 138 139 138 138 137  K 3.6 3.7 3.8 4.6 4.5  CL 100* 97* 97* 98* 98*  CO2 33* 35* 35* 35* 32  GLUCOSE 140* 124* 132* 142* 129*  BUN 22* 25* 32* 30* 36*  CREATININE 0.64 0.60  0.68 0.76 0.79  CALCIUM 8.5* 8.4* 8.5* 8.8* 8.5*   Liver Function Tests: No results for input(s): AST, ALT, ALKPHOS, BILITOT, PROT, ALBUMIN in the last 168 hours. No results for input(s): LIPASE, AMYLASE in the last 168 hours. No results for input(s): AMMONIA in the last 168 hours. CBC:  Recent Labs Lab 08/04/16 0455 08/05/16 0507 08/06/16 0508 08/07/16 0511  WBC 5.6 4.9 12.0* 13.0*  HGB 11.6* 11.8* 12.7 12.4  HCT 36.5 36.5 35.8* 38.3  MCV 100.3* 99.2 95.2 98.2  PLT 211 242 287 313   Cardiac Enzymes:  Recent Labs Lab 08/09/16 1046  TROPONINI <0.03   BNP: BNP (last 3  results)  Recent Labs  08/01/16 0418  BNP 177.2*    ProBNP (last 3 results) No results for input(s): PROBNP in the last 8760 hours.  CBG:  Recent Labs Lab 08/06/16 0820 08/07/16 0727 08/08/16 0737 08/09/16 0736 08/10/16 0747  GLUCAP 145* 135* 111* 129* 125*    Time spent: 32 minutes were spent in preparing this discharge including medication reconciliation, counseling, and coordination of care.  Signed:  Mir Marry Guan, MD  [facility] 08/10/2016, 1:12 PM

## 2016-08-10 NOTE — Clinical Social Work Placement (Signed)
Patient is set to discharge to [facility] today. Patient & daughter-in-law, Norma Ford made aware. Discharge packet given to RN, Dreama Saa. [facility] called for transport.     Raynaldo Opitz, [facility] Clinical Social Worker cell #: [phone]    CLINICAL SOCIAL WORK PLACEMENT  NOTE  Date:  08/10/2016  Patient Details  Name: Norma Ford MRN: [id number] Date of Birth: March 06, 1930  Clinical Social Work is seeking post-discharge placement for this patient at the [place] level of care (*CSW will initial, date and re-position this form in  chart as items are completed):  Yes   Patient/family provided with [place] Work Department's list of facilities offering this level of care within the geographic area requested by the patient (or if unable, by the patient's family).  Yes   Patient/family informed of their freedom to choose among providers that offer the needed level of care, that participate in Medicare, Medicaid or managed care program needed by the patient, have an available bed and are willing to accept the patient.  Yes   Patient/family informed of [facility]'s ownership interest in [facility] and [facility], as well as of the fact that they are under no obligation to receive care at these facilities.  PASRR submitted to EDS on 08/09/16     PASRR number received on 08/09/16     Existing PASRR number confirmed on       FL2 transmitted to all facilities in geographic area requested by pt/family on 08/09/16     FL2 transmitted to all facilities within larger geographic area on       Patient informed that his/her managed care company has contracts with or will negotiate with certain facilities, including the following:        Yes   Patient/family informed of bed offers received.  Patient chooses bed at [facility]     Physician recommends and patient chooses bed at      Patient to be transferred  to [facility] on 08/10/16.  Patient to be transferred to facility by [facility]     Patient family notified on 08/10/16 of transfer.  Name of family member notified:  patient's daughter-in-law, Norma Ford via phone     PHYSICIAN       Additional Comment:    _______________________________________________ Standley Brooking, LCSW 08/10/2016, 1:40 PM

## 2016-08-11 ENCOUNTER — Non-Acute Institutional Stay (SKILLED_NURSING_FACILITY): Payer: Medicare Other | Admitting: Internal Medicine

## 2016-08-11 ENCOUNTER — Encounter: Payer: Self-pay | Admitting: Internal Medicine

## 2016-08-11 ENCOUNTER — Encounter: Payer: Self-pay | Admitting: Family Medicine

## 2016-08-11 DIAGNOSIS — J9601 Acute respiratory failure with hypoxia: Secondary | ICD-10-CM

## 2016-08-11 DIAGNOSIS — I5033 Acute on chronic diastolic (congestive) heart failure: Secondary | ICD-10-CM

## 2016-08-11 DIAGNOSIS — I110 Hypertensive heart disease with heart failure: Secondary | ICD-10-CM

## 2016-08-11 DIAGNOSIS — J189 Pneumonia, unspecified organism: Secondary | ICD-10-CM

## 2016-08-11 DIAGNOSIS — G4734 Idiopathic sleep related nonobstructive alveolar hypoventilation: Secondary | ICD-10-CM

## 2016-08-11 DIAGNOSIS — F329 Major depressive disorder, single episode, unspecified: Secondary | ICD-10-CM

## 2016-08-11 DIAGNOSIS — S92404D Nondisplaced unspecified fracture of right great toe, subsequent encounter for fracture with routine healing: Secondary | ICD-10-CM | POA: Diagnosis not present

## 2016-08-11 DIAGNOSIS — R296 Repeated falls: Secondary | ICD-10-CM | POA: Diagnosis not present

## 2016-08-11 DIAGNOSIS — E782 Mixed hyperlipidemia: Secondary | ICD-10-CM | POA: Diagnosis not present

## 2016-08-11 DIAGNOSIS — I4891 Unspecified atrial fibrillation: Secondary | ICD-10-CM

## 2016-08-11 DIAGNOSIS — F32A Depression, unspecified: Secondary | ICD-10-CM

## 2016-08-11 NOTE — Progress Notes (Signed)
: Provider:  Noah Delaine. Sheppard Coil, MD Location:  [place] Room Number: [place] of Service:  SNF ([phone])  PCP: Ann Held, DO Patient Care Team: Ann Held, DO as PCP - General  Extended Emergency Contact Information Primary Emergency Contact: Kading,Lois  [place] Mobile Phone: [phone] Relation: Daughter Secondary Emergency Contact: Carleene Over[place] Mobile Phone: [phone] Relation: Daughter     Allergies: Codeine  Chief Complaint  Patient presents with  . New Admit To SNF    Admit to Facility    HPI: Patient is 82 y.o. female with hypertension, hyperlipidemia, depression, adrenal tumor, Barrett's esophagus, mycosis fungoides lymph nodes, morbid obesity, frequent fall, who presented with fall, right great toe pain, bilateral thigh pain. Pt was admitted to [facility] from 9/24-10/4 where she was put in a CAM walker by ortho and where she developed new onset AF with RVR, controlled prior to d/c. Pt is not a anti-coag candidate 2/2 fall risk.Hospital course was further compensated by acute respiratory failure 2/2 bronchitis vs PNA and acute on chronic dCHF. Pt is d/c to SNF for generalized weakness for OT/PT. While at [facility] pt will be followed for HLD, tx with zocor and fish oil, HTN, tx with cardizem, lasix, metoprolol, and depression, tx with zoloft.  Past Medical History:  Diagnosis Date  . Adrenal tumor 2002   right, followed by Surgery  . Barrett's esophagus 1999  . Cataracts, bilateral   . DECUBITUS ULCER, BUTTOCK 12/21/2009  . History of abnormal mammogram    2001-h/o abnormal mammo-told just calcium deposit-repeat  . HTN (hypertension)   . Hyperlipidemia   . Kidney stone   . MYCOSIS FUNGOIDES LYMPH NODES MULTIPLE SITES 09/21/2009  . THROMBOCYTOPENIA 12/21/2009    Past Surgical History:  Procedure Laterality Date  . ABDOMINAL HYSTERECTOMY    . appendectomy    .  ESOPHAGOGASTRODUODENOSCOPY  03/2002  . TONSILLECTOMY    . TOTAL KNEE ARTHROPLASTY     left 2009, right 2003  . tubal ligation        Medication List       Accurate as of 08/11/16 10:26 AM. Always use your most recent med list.          ALPRAZolam 0.25 MG tablet Commonly known as:  XANAX Take 1 tablet (0.25 mg total) by mouth 2 (two) times daily as needed for anxiety.   aspirin 325 MG EC tablet Take 1 tablet (325 mg total) by mouth daily.   Commode Bedside Misc Bariatric Bedside commode   Bariatric Rollator Misc [employer], museum with Seat. Dx:   diltiazem 120 MG 12 hr capsule Commonly known as:  CARDIZEM SR Take 1 capsule (120 mg total) by mouth every 12 (twelve) hours.   FISH OIL PO Take 1 tablet by mouth 2 (two) times daily.   furosemide 40 MG tablet Commonly known as:  LASIX Take 1 tablet (40 mg total) by mouth daily.   HYDROcodone-acetaminophen 5-325 MG tablet Commonly known as:  NORCO/VICODIN Take 1 tablet by mouth every 4 (four) hours as needed for moderate pain.   metoprolol 50 MG tablet Commonly known as:  LOPRESSOR Take 1 tablet (50 mg total) by mouth 2 (two) times daily.   mometasone 50 MCG/ACT nasal spray Commonly known as:  NASONEX Place 2 sprays into the nose daily as needed (for congestion).   multivitamin tablet Take 1 tablet by mouth daily.   NONFORMULARY OR COMPOUNDED  ITEM Wheelchair, manual #1  As directed---dx osteoarthritis, frequent falls   NONFORMULARY OR COMPOUNDED ITEM Lift Chair to use at home as needed   polyethylene glycol powder powder Commonly known as:  GLYCOLAX/MIRALAX Take 17 g by mouth 2 (two) times daily as needed.   potassium chloride SA 20 MEQ tablet Commonly known as:  K-DUR,KLOR-CON Take 1 tablet (20 mEq total) by mouth daily.   predniSONE 20 MG tablet Commonly known as:  DELTASONE Take 1 tablet (20 mg total) by mouth daily with breakfast.   sertraline 50 MG tablet Commonly known as:  ZOLOFT Take 1  tablet by mouth  daily   simvastatin 80 MG tablet Commonly known as:  ZOCOR Take 1 tablet by mouth at  bedtime       No orders of the defined types were placed in this encounter.   Immunization History  Administered Date(s) Administered  . Influenza Whole 09/23/2010  . Influenza, High Dose Seasonal PF 07/07/2015  . Pneumococcal Conjugate-13 07/07/2015  . Pneumococcal Polysaccharide-23 09/23/2010    Social History  Substance Use Topics  . Smoking status: Never Smoker  . Smokeless tobacco: Never Used  . Alcohol use No    Family history is   Family History  Problem Relation Age of Onset  . Heart disease Mother   . Heart disease Father     pacemaker  . Kidney disease Father       Review of Systems  DATA OBTAINED: from patient, nurse GENERAL:  no fevers, fatigue, appetite changes SKIN: No itching, or rash EYES: No eye pain, redness, discharge EARS: No earache, tinnitus, change in hearing NOSE: No congestion, drainage or bleeding  MOUTH/THROAT: No mouth or tooth pain, No sore throat RESPIRATORY: No cough, wheezing, SOB CARDIAC: No chest pain, palpitations, lower extremity edema  GI: No abdominal pain, No N/V/D or constipation, No heartburn or reflux  GU: No dysuria, frequency or urgency, or incontinence  MUSCULOSKELETAL: No unrelieved bone/joint pain NEUROLOGIC: No headache, dizziness or focal weakness PSYCHIATRIC: No c/o anxiety or sadness   Vitals:   08/11/16 1002  BP: (!) 151/89  Pulse: (!) 106  Resp: (!) 22  Temp: 97 F (36.1 C)    SpO2 Readings from Last 1 Encounters:  08/11/16 97%   Body mass index is 51.94 kg/m.     Physical Exam  GENERAL APPEARANCE: Alert, conversant,  No acute distress.  SKIN: No diaphoresis rash HEAD: Normocephalic, atraumatic  EYES: Conjunctiva/lids clear. Pupils round, reactive. EOMs intact.  EARS: External exam WNL, canals clear. Hearing grossly normal.  NOSE: No deformity or discharge.  MOUTH/THROAT: Lips w/o  lesions  RESPIRATORY: Breathing is even, unlabored. Lung sounds are clear   CARDIOVASCULAR: Heart RRR no murmurs, rubs or gallops. No peripheral edema.   GASTROINTESTINAL: Abdomen is soft, non-tender, not distended w/ normal bowel sounds. GENITOURINARY: Bladder non tender, not distended  MUSCULOSKELETAL: R CAM walker boot NEUROLOGIC:  Cranial nerves 2-12 grossly intact. Moves all extremities  PSYCHIATRIC: Mood and affect appropriate to situation, no behavioral issues  Patient Active Problem List   Diagnosis Date Noted  . Closed fracture of phalanx of toe of right foot, initial encounter   . Acute diastolic CHF 0000000  . Atrial fibrillation with rapid ventricular response ([place]) 08/01/2016  . Fall 08/01/2016  . Thigh pain 08/01/2016  . Toe fracture, right 08/01/2016  . Atrial fibrillation with RVR ([place]) 08/01/2016  . Depression 08/01/2016  . Right leg pain 07/01/2016  . Frequent falls 02/14/2016  . History of fall  11/14/2015  . Osteoarthritis of both knees 05/15/2015  . Morbid obesity ([place]) 02/24/2014  . Cellulitis and abscess of lower leg 05/21/2012  . Calf pain 05/21/2012  . Edema 05/21/2012  . Chronic bronchitis 11/03/2011  . Hypoxia 10/20/2011  . THROMBOCYTOPENIA 12/21/2009  . DECUBITUS ULCER, BUTTOCK 12/21/2009  . MYCOSIS FUNGOIDES LYMPH NODES MULTIPLE SITES 09/21/2009  . DEGENERATIVE JOINT DISEASE, KNEE 09/21/2009  . SHOULDER PAIN, RIGHT 09/21/2009  . FEMALE STRESS INCONTINENCE 01/16/2009  . HLD (hyperlipidemia) 02/12/2008  . Essential hypertension 02/12/2008      Labs reviewed: Basic Metabolic Panel:    Component Value Date/Time   NA 137 08/10/2016 0523   NA 137 08/10/2016   K 4.5 08/10/2016 0523   CL 98 (L) 08/10/2016 0523   CO2 32 08/10/2016 0523   GLUCOSE 129 (H) 08/10/2016 0523   BUN 36 (H) 08/10/2016 0523   BUN 36 (A) 08/10/2016   CREATININE 0.79 08/10/2016 0523   CALCIUM 8.5 (L) 08/10/2016 0523   PROT 7.0 07/04/2016 1700   ALBUMIN 3.7 07/04/2016  1700   AST 14 07/04/2016 1700   ALT 9 07/04/2016 1700   ALKPHOS 41 07/04/2016 1700   BILITOT 0.3 07/04/2016 1700   GFRNONAA >60 08/10/2016 0523   GFRAA >60 08/10/2016 0523     Recent Labs  08/01/16 0418  08/03/16 0401  08/08/16 0428 08/09/16 08/09/16 0328 08/10/16 08/10/16 0523  NA 139  < > 138  < > 138 138 138 137 137  K 4.0  < > 4.1  < > 3.8 4.6 4.6  --  4.5  CL 106  < > 101  < > 97*  --  98*  --  98*  CO2 26  < > 30  < > 35*  --  35*  --  32  GLUCOSE 99  < > 100*  < > 132*  --  142*  --  129*  BUN 24*  < > 21*  < > 32* 30* 30* 36* 36*  CREATININE 0.78  < > 0.78  < > 0.68 0.8 0.76 0.8 0.79  CALCIUM 9.0  < > 8.5*  < > 8.5*  --  8.8*  --  8.5*  MG 2.1  --  2.0  --   --   --   --   --   --   < > = values in this interval not displayed. Liver Function Tests:  Recent Labs  10/22/15 1233 02/11/16 1230 07/04/16 1700  AST 14 13 14   ALT 10 9 9   ALKPHOS 52 52 41  BILITOT 0.3 0.4 0.3  PROT 6.7 6.6 7.0  ALBUMIN 3.5 3.5 3.7   No results for input(s): LIPASE, AMYLASE in the last 8760 hours. No results for input(s): AMMONIA in the last 8760 hours. CBC:  Recent Labs  02/11/16 1230 07/04/16 1700  08/01/16 0021  08/05/16 0507 08/06/16 08/06/16 0508 08/07/16 08/07/16 0511  WBC 5.0 5.4  < > 6.6  < > 4.9 12.0 12.0* 13.0 13.0*  NEUTROABS 3.1 3.2  --  4.9  --   --   --   --   --   --   HGB 12.8 13.4  < > 12.1  < > 11.8* 12.7 12.7  --  12.4  HCT 38.0 39.7  < > 36.9  < > 36.5 36 35.8*  --  38.3  MCV 96.4 97.7  --  98.7  < > 99.2  --  95.2  --  98.2  PLT 165.0 151.0  < >  168  < > 242 287 287  --  313  < > = values in this interval not displayed. Lipid  Recent Labs  10/22/15 1233 08/01/16 0418  CHOL 183 162  HDL 42.20 38*  LDLCALC 111* 110*  TRIG 146.0 69    Cardiac Enzymes:  Recent Labs  08/01/16 0418 08/01/16 1032 08/09/16 1046  TROPONINI <0.03 <0.03 <0.03   BNP:  Recent Labs  08/01/16 0418  BNP 177.2*   Lab Results  Component Value Date   MICROALBUR  0.7 11/10/2014   Lab Results  Component Value Date   HGBA1C 5.2 07/04/2016   Lab Results  Component Value Date   TSH 1.812 08/01/2016   Lab Results  Component Value Date   [id number] (H) 01/18/2010   No results found for: FOLATE Lab Results  Component Value Date   FERRITIN 133 01/18/2010    Imaging and Procedures obtained prior to SNF admission: Dg Chest Portable 1 View  Result Date: 08/01/2016 CLINICAL DATA:  Generalized weakness for 2 weeks. Fell at home tonight. Leg pain. History of hypertension, hyperlipidemia. EXAM: PORTABLE CHEST 1 VIEW COMPARISON:  Chest radiograph November 10, 2014 FINDINGS: Cardiac silhouette is mildly enlarged, even with CAD considerations low inspiratory examination. Pulmonary vascular congestion interstitial prominence. Small bilateral pleural effusions. No pneumothorax. Severe degenerative change of the shoulders. IMPRESSION: Mild cardiomegaly.  Interstitial edema with small pleural effusions. Electronically Signed   By: Elon Alas M.D.   On: 08/01/2016 01:51   Dg Foot Complete Right  Result Date: 08/01/2016 CLINICAL DATA:  Acute onset of right foot pain, especially at the great toe, after fall. Initial encounter. EXAM: RIGHT FOOT COMPLETE - 3+ VIEW COMPARISON:  None. FINDINGS: There is a comminuted fracture involving the first proximal phalanx, with 2 fracture lines extending to the first metatarsophalangeal joint, and mild impaction of a central fragment. No additional fractures are seen. There is no evidence of talar subluxation; the subtalar joint is unremarkable in appearance. A plantar calcaneal spur is noted. Diffuse dorsal soft tissue swelling is noted at the foot, with scattered soft tissue calcifications. IMPRESSION: 1. Comminuted fracture of the first proximal phalanx, with 2 fracture lines extending to the first metatarsophalangeal joint, and mild impaction of a central fragment. 2. Diffuse dorsal soft tissue swelling, with scattered soft  tissue calcifications. Electronically Signed   By: Garald Balding M.D.   On: 08/01/2016 01:01   Dg Femur Min 2 Views Left  Result Date: 08/01/2016 CLINICAL DATA:  82 year old female with bilateral leg pain and fall. EXAM: LEFT FEMUR 2 VIEWS COMPARISON:  Right femur radiograph dated 07/31/2016 FINDINGS: There is no acute fracture or dislocation. The bones are osteopenic. There is a total left knee arthroplasty. There are osteoarthritic changes of the left hip. There is spurring of the superior patella along the insertion of the quadriceps tendon. No significant joint effusion. The soft tissues are grossly unremarkable. IMPRESSION: No acute fracture or dislocation. Electronically Signed   By: Anner Crete M.D.   On: 08/01/2016 00:59   Dg Femur, Min 2 Views Right  Result Date: 08/01/2016 CLINICAL DATA:  Status post fall, with bilateral femur pain. Initial encounter. EXAM: RIGHT FEMUR 2 VIEWS COMPARISON:  None. FINDINGS: There is no evidence of fracture or dislocation. The right femur appears grossly intact. The right hip joint is grossly unremarkable. The patient's right knee arthroplasty is grossly unremarkable in appearance, without evidence of loosening. No definite soft tissue abnormalities are characterized on radiograph. No knee joint effusion is  seen. IMPRESSION: No evidence of fracture or dislocation. Right knee arthroplasty is unremarkable in appearance. Electronically Signed   By: Garald Balding M.D.   On: 08/01/2016 01:03     Not all labs, radiology exams or other studies done during hospitalization come through on my EPIC note; however they are reviewed by me.    Assessment and Plan  AF with RVR - tx with IV cardizem, then po cardizem along with metoprolol per Cards; prophylaxis with ASA 325 mg daily SNF - cont cardizem 120 mg 12 nr q12, metoprolol 50 mg BID and ASA 325 mg daily;monitor BP and pulse; no anticog 2/2 falls  R GREAT TOE FX SNF - CAM walker and WBAT  BRONCHITIS VS  HCAP - ; tx with nebs steroids zithromax and ceftin; CXR with new airspace process SNF - pt still O2 dep; cont prednisone 20 mg daily  ACUTE RESPIRATORY FAILURE WITH HYPOXIA/ ACUTE ON CHRONIC CHF / MORBID OBESITY - TX WITH iv LASIX UNTIL CONTRACTION ALKALOSIS;prob also OSA with hypoventilation;sleep study rec SNF -cont with metoprolol and lasix; cont to try to weanO2  HTN SNF - cont dilt 120 mg BID, metoprolol 50 mg BID and lasix 40 mg daily; BP elevated today-will monitor  HLD - LDL 110, HDL 38- not sure if pt had been taking her meds SNF - cont zocor 80 mg and fish oil daily  DEPRESSION SNF - not stated as uncontrolled;cont zocor 50 mg qHS  FALLS SNF - deconditioning and obesity; admitted for OT/PT     Time spent > 45 min;> 50% of time with patient was spent reviewing records, labs, tests and studies, counseling and developing plan of care  Webb Silversmith D. Sheppard Coil, MD

## 2016-08-12 ENCOUNTER — Encounter: Payer: Self-pay | Admitting: Internal Medicine

## 2016-08-12 ENCOUNTER — Non-Acute Institutional Stay (SKILLED_NURSING_FACILITY): Payer: Medicare Other | Admitting: Internal Medicine

## 2016-08-12 DIAGNOSIS — J9601 Acute respiratory failure with hypoxia: Secondary | ICD-10-CM | POA: Insufficient documentation

## 2016-08-12 DIAGNOSIS — J189 Pneumonia, unspecified organism: Secondary | ICD-10-CM | POA: Insufficient documentation

## 2016-08-12 DIAGNOSIS — I5033 Acute on chronic diastolic (congestive) heart failure: Secondary | ICD-10-CM | POA: Insufficient documentation

## 2016-08-12 DIAGNOSIS — B370 Candidal stomatitis: Secondary | ICD-10-CM

## 2016-08-12 NOTE — Progress Notes (Signed)
Location:  [facility] Room Number: 111P Place of Service:  SNF (31)  Noah Delaine. Sheppard Coil, MD  Patient Care Team: Ann Held, DO as PCP - General  Extended Emergency Contact Information Primary Emergency Contact: Tawana Scale of [place] Mobile Phone: [phone] Relation: Daughter Secondary Emergency Contact: Carleene Overlie [place] of [place] Mobile Phone: [phone] Relation: Daughter    Allergies: Codeine  Chief Complaint  Patient presents with  . Acute Visit    Acute     HPI: Patient is 82 y.o. female who nursing asked me to see. Pt c/o mouth being sore for maybe several days.. Denies sore to swallow. Recent antibiotic use.  Past Medical History:  Diagnosis Date  . Adrenal tumor 2002   right, followed by Surgery  . Barrett's esophagus 1999  . Cataracts, bilateral   . DECUBITUS ULCER, BUTTOCK 12/21/2009  . History of abnormal mammogram    2001-h/o abnormal mammo-told just calcium deposit-repeat  . HTN (hypertension)   . Hyperlipidemia   . Kidney stone   . MYCOSIS FUNGOIDES LYMPH NODES MULTIPLE SITES 09/21/2009  . THROMBOCYTOPENIA 12/21/2009    Past Surgical History:  Procedure Laterality Date  . ABDOMINAL HYSTERECTOMY    . appendectomy    . ESOPHAGOGASTRODUODENOSCOPY  03/2002  . TONSILLECTOMY    . TOTAL KNEE ARTHROPLASTY     left 2009, right 2003  . tubal ligation        Medication List       Accurate as of 08/12/16 11:15 AM. Always use your most recent med list.          ALPRAZolam 0.25 MG tablet Commonly known as:  XANAX Take 1 tablet (0.25 mg total) by mouth 2 (two) times daily as needed for anxiety.   aspirin 325 MG EC tablet Take 1 tablet (325 mg total) by mouth daily.   Commode Bedside Misc Bariatric Bedside commode   Bariatric Rollator Misc [employer], museum with Seat. Dx:   diltiazem 120 MG 12 hr capsule Commonly known as:  CARDIZEM SR Take 1 capsule (120 mg  total) by mouth every 12 (twelve) hours.   FISH OIL PO Take 1 tablet by mouth 2 (two) times daily.   furosemide 40 MG tablet Commonly known as:  LASIX Take 1 tablet (40 mg total) by mouth daily.   HYDROcodone-acetaminophen 5-325 MG tablet Commonly known as:  NORCO/VICODIN Take 1 tablet by mouth every 4 (four) hours as needed for moderate pain.   metoprolol 50 MG tablet Commonly known as:  LOPRESSOR Take 1 tablet (50 mg total) by mouth 2 (two) times daily.   mometasone 50 MCG/ACT nasal spray Commonly known as:  NASONEX Place 2 sprays into the nose daily as needed (for congestion).   multivitamin tablet Take 1 tablet by mouth daily.   NONFORMULARY OR COMPOUNDED ITEM Wheelchair, manual #1  As directed---dx osteoarthritis, frequent falls   NONFORMULARY OR COMPOUNDED ITEM Lift Chair to use at home as needed   polyethylene glycol powder powder Commonly known as:  GLYCOLAX/MIRALAX Take 17 g by mouth 2 (two) times daily as needed.   potassium chloride SA 20 MEQ tablet Commonly known as:  K-DUR,KLOR-CON Take 1 tablet (20 mEq total) by mouth daily.   predniSONE 20 MG tablet Commonly known as:  DELTASONE Take 1 tablet (20 mg total) by mouth daily with breakfast.   sertraline 50 MG tablet Commonly known as:  ZOLOFT Take 1 tablet by mouth  daily  simvastatin 80 MG tablet Commonly known as:  ZOCOR Take 1 tablet by mouth at  bedtime       No orders of the defined types were placed in this encounter.   Immunization History  Administered Date(s) Administered  . Influenza Whole 09/23/2010  . Influenza, High Dose Seasonal PF 07/07/2015  . PPD Test 08/10/2016  . Pneumococcal Conjugate-13 07/07/2015  . Pneumococcal Polysaccharide-23 09/23/2010    Social History  Substance Use Topics  . Smoking status: Never Smoker  . Smokeless tobacco: Never Used  . Alcohol use No    Review of Systems  DATA OBTAINED: from patient, nurse GENERAL:  no fevers, fatigue, appetite  changes SKIN: No itching, rash HEENT: as per HPI RESPIRATORY: No cough, wheezing, SOB CARDIAC: No chest pain, palpitations, lower extremity edema  GI: No abdominal pain, No N/V/D or constipation, No heartburn or reflux  GU: No dysuria, frequency or urgency, or incontinence  MUSCULOSKELETAL: No unrelieved bone/joint pain NEUROLOGIC: No headache, dizziness  PSYCHIATRIC: No overt anxiety or sadness  Vitals:   08/12/16 1056  BP: 128/72  Pulse: (!) 101  Resp: 18  Temp: 97.2 F (36.2 C)   Body mass index is 52.95 kg/m. Physical Exam  GENERAL APPEARANCE: Alert, conversant, No acute distress  SKIN: No diaphoresis rash HEENT: tongue red with some swelling;white plaque on tongue, none seen on post pharynx RESPIRATORY: Breathing is even, unlabored. Lung sounds are clear   CARDIOVASCULAR: Heart RRR no murmurs, rubs or gallops. No peripheral edema  GASTROINTESTINAL: Abdomen is soft, non-tender, not distended w/ normal bowel sounds.  GENITOURINARY: Bladder non tender, not distended  MUSCULOSKELETAL - CAM walker RLE NEUROLOGIC: Cranial nerves 2-12 grossly intact. Moves all extremities PSYCHIATRIC: Mood and affect appropriate to situation, no behavioral issues  Patient Active Problem List   Diagnosis Date Noted  . Closed fracture of phalanx of toe of right foot, initial encounter   . Acute diastolic CHF 0000000  . Atrial fibrillation with rapid ventricular response ([place]) 08/01/2016  . Fall 08/01/2016  . Thigh pain 08/01/2016  . Toe fracture, right 08/01/2016  . Atrial fibrillation with RVR ([place]) 08/01/2016  . Depression 08/01/2016  . Right leg pain 07/01/2016  . Frequent falls 02/14/2016  . History of fall 11/14/2015  . Osteoarthritis of both knees 05/15/2015  . Morbid obesity ([place]) 02/24/2014  . Cellulitis and abscess of lower leg 05/21/2012  . Calf pain 05/21/2012  . Edema 05/21/2012  . Chronic bronchitis 11/03/2011  . Hypoxia 10/20/2011  . THROMBOCYTOPENIA 12/21/2009  .  DECUBITUS ULCER, BUTTOCK 12/21/2009  . MYCOSIS FUNGOIDES LYMPH NODES MULTIPLE SITES 09/21/2009  . DEGENERATIVE JOINT DISEASE, KNEE 09/21/2009  . SHOULDER PAIN, RIGHT 09/21/2009  . FEMALE STRESS INCONTINENCE 01/16/2009  . HLD (hyperlipidemia) 02/12/2008  . Essential hypertension 02/12/2008    CMP     Component Value Date/Time   NA 137 08/10/2016 0523   NA 137 08/10/2016   K 4.5 08/10/2016 0523   CL 98 (L) 08/10/2016 0523   CO2 32 08/10/2016 0523   GLUCOSE 129 (H) 08/10/2016 0523   BUN 36 (H) 08/10/2016 0523   BUN 36 (A) 08/10/2016   CREATININE 0.79 08/10/2016 0523   CALCIUM 8.5 (L) 08/10/2016 0523   PROT 7.0 07/04/2016 1700   ALBUMIN 3.7 07/04/2016 1700   AST 14 07/04/2016 1700   ALT 9 07/04/2016 1700   ALKPHOS 41 07/04/2016 1700   BILITOT 0.3 07/04/2016 1700   GFRNONAA >60 08/10/2016 0523   GFRAA >60 08/10/2016 0523    Recent  Labs  08/01/16 0418  08/03/16 0401  08/08/16 0428 08/09/16 08/09/16 0328 08/10/16 08/10/16 0523  NA 139  < > 138  < > 138 138 138 137 137  K 4.0  < > 4.1  < > 3.8 4.6 4.6  --  4.5  CL 106  < > 101  < > 97*  --  98*  --  98*  CO2 26  < > 30  < > 35*  --  35*  --  32  GLUCOSE 99  < > 100*  < > 132*  --  142*  --  129*  BUN 24*  < > 21*  < > 32* 30* 30* 36* 36*  CREATININE 0.78  < > 0.78  < > 0.68 0.8 0.76 0.8 0.79  CALCIUM 9.0  < > 8.5*  < > 8.5*  --  8.8*  --  8.5*  MG 2.1  --  2.0  --   --   --   --   --   --   < > = values in this interval not displayed.  Recent Labs  10/22/15 1233 02/11/16 1230 07/04/16 1700  AST 14 13 14   ALT 10 9 9   ALKPHOS 52 52 41  BILITOT 0.3 0.4 0.3  PROT 6.7 6.6 7.0  ALBUMIN 3.5 3.5 3.7    Recent Labs  02/11/16 1230 07/04/16 1700  08/01/16 0021  08/05/16 0507 08/06/16 08/06/16 0508 08/07/16 08/07/16 0511  WBC 5.0 5.4  < > 6.6  < > 4.9 12.0 12.0* 13.0 13.0*  NEUTROABS 3.1 3.2  --  4.9  --   --   --   --   --   --   HGB 12.8 13.4  < > 12.1  < > 11.8* 12.7 12.7  --  12.4  HCT 38.0 39.7  < > 36.9  <  > 36.5 36 35.8*  --  38.3  MCV 96.4 97.7  --  98.7  < > 99.2  --  95.2  --  98.2  PLT 165.0 151.0  < > 168  < > 242 287 287  --  313  < > = values in this interval not displayed.  Recent Labs  10/22/15 1233 08/01/16 0418  CHOL 183 162  LDLCALC 111* 110*  TRIG 146.0 69   Lab Results  Component Value Date   MICROALBUR 0.7 11/10/2014   Lab Results  Component Value Date   TSH 1.812 08/01/2016   Lab Results  Component Value Date   HGBA1C 5.2 07/04/2016   Lab Results  Component Value Date   CHOL 162 08/01/2016   HDL 38 (L) 08/01/2016   LDLCALC 110 (H) 08/01/2016   TRIG 69 08/01/2016   CHOLHDL 4.3 08/01/2016    Significant Diagnostic Results in last 30 days:  Dg Chest Port 1 View  Result Date: 08/04/2016 CLINICAL DATA:  F/u pneumonia; sob and wheezing EXAM: PORTABLE CHEST 1 VIEW COMPARISON:  08/01/2016 FINDINGS: Exam is lordotic. Extremely low lung volumes. There is increased perihilar vascular congestion. New perihilar airspace disease. No pneumothorax IMPRESSION: New perihilar airspace disease suggests pulmonary edema. Low lung volumes the Electronically Signed   By: Suzy Bouchard M.D.   On: 08/04/2016 08:28   Dg Chest Portable 1 View  Result Date: 08/01/2016 CLINICAL DATA:  Generalized weakness for 2 weeks. Fell at home tonight. Leg pain. History of hypertension, hyperlipidemia. EXAM: PORTABLE CHEST 1 VIEW COMPARISON:  Chest radiograph November 10, 2014 FINDINGS: Cardiac silhouette is mildly  enlarged, even with CAD considerations low inspiratory examination. Pulmonary vascular congestion interstitial prominence. Small bilateral pleural effusions. No pneumothorax. Severe degenerative change of the shoulders. IMPRESSION: Mild cardiomegaly.  Interstitial edema with small pleural effusions. Electronically Signed   By: Elon Alas M.D.   On: 08/01/2016 01:51   Dg Foot Complete Right  Result Date: 08/01/2016 CLINICAL DATA:  Acute onset of right foot pain, especially at the  great toe, after fall. Initial encounter. EXAM: RIGHT FOOT COMPLETE - 3+ VIEW COMPARISON:  None. FINDINGS: There is a comminuted fracture involving the first proximal phalanx, with 2 fracture lines extending to the first metatarsophalangeal joint, and mild impaction of a central fragment. No additional fractures are seen. There is no evidence of talar subluxation; the subtalar joint is unremarkable in appearance. A plantar calcaneal spur is noted. Diffuse dorsal soft tissue swelling is noted at the foot, with scattered soft tissue calcifications. IMPRESSION: 1. Comminuted fracture of the first proximal phalanx, with 2 fracture lines extending to the first metatarsophalangeal joint, and mild impaction of a central fragment. 2. Diffuse dorsal soft tissue swelling, with scattered soft tissue calcifications. Electronically Signed   By: Garald Balding M.D.   On: 08/01/2016 01:01   Dg Femur Min 2 Views Left  Result Date: 08/01/2016 CLINICAL DATA:  82 year old female with bilateral leg pain and fall. EXAM: LEFT FEMUR 2 VIEWS COMPARISON:  Right femur radiograph dated 07/31/2016 FINDINGS: There is no acute fracture or dislocation. The bones are osteopenic. There is a total left knee arthroplasty. There are osteoarthritic changes of the left hip. There is spurring of the superior patella along the insertion of the quadriceps tendon. No significant joint effusion. The soft tissues are grossly unremarkable. IMPRESSION: No acute fracture or dislocation. Electronically Signed   By: Anner Crete M.D.   On: 08/01/2016 00:59   Dg Femur, Min 2 Views Right  Result Date: 08/01/2016 CLINICAL DATA:  Status post fall, with bilateral femur pain. Initial encounter. EXAM: RIGHT FEMUR 2 VIEWS COMPARISON:  None. FINDINGS: There is no evidence of fracture or dislocation. The right femur appears grossly intact. The right hip joint is grossly unremarkable. The patient's right knee arthroplasty is grossly unremarkable in appearance,  without evidence of loosening. No definite soft tissue abnormalities are characterized on radiograph. No knee joint effusion is seen. IMPRESSION: No evidence of fracture or dislocation. Right knee arthroplasty is unremarkable in appearance. Electronically Signed   By: Garald Balding M.D.   On: 08/01/2016 01:03    Assessment and Plan  ORAL CANDIDIAIS - diflucan 200 mg day 1, then 100 mg day for 14 days     Jamille Fisher D. Sheppard Coil, MD

## 2016-08-12 NOTE — Progress Notes (Signed)
Opened in error; Disregard.

## 2016-08-15 ENCOUNTER — Non-Acute Institutional Stay (SKILLED_NURSING_FACILITY): Payer: Medicare Other | Admitting: Internal Medicine

## 2016-08-15 ENCOUNTER — Inpatient Hospital Stay (HOSPITAL_COMMUNITY)
Admission: EM | Admit: 2016-08-15 | Discharge: 2016-08-26 | DRG: 602 | Disposition: A | Discharge status: Hospice | Payer: Medicare Other | Attending: Internal Medicine | Admitting: Internal Medicine

## 2016-08-15 ENCOUNTER — Encounter: Payer: Self-pay | Admitting: Internal Medicine

## 2016-08-15 ENCOUNTER — Ambulatory Visit (INDEPENDENT_AMBULATORY_CARE_PROVIDER_SITE_OTHER): Payer: Medicare Other | Admitting: Orthopedic Surgery

## 2016-08-15 DIAGNOSIS — B370 Candidal stomatitis: Secondary | ICD-10-CM | POA: Diagnosis present

## 2016-08-15 DIAGNOSIS — I5033 Acute on chronic diastolic (congestive) heart failure: Secondary | ICD-10-CM | POA: Diagnosis not present

## 2016-08-15 DIAGNOSIS — L03115 Cellulitis of right lower limb: Secondary | ICD-10-CM | POA: Diagnosis not present

## 2016-08-15 DIAGNOSIS — J42 Unspecified chronic bronchitis: Secondary | ICD-10-CM | POA: Diagnosis not present

## 2016-08-15 DIAGNOSIS — K5903 Drug induced constipation: Secondary | ICD-10-CM | POA: Diagnosis not present

## 2016-08-15 DIAGNOSIS — Z841 Family history of disorders of kidney and ureter: Secondary | ICD-10-CM

## 2016-08-15 DIAGNOSIS — I5032 Chronic diastolic (congestive) heart failure: Secondary | ICD-10-CM

## 2016-08-15 DIAGNOSIS — Z515 Encounter for palliative care: Secondary | ICD-10-CM

## 2016-08-15 DIAGNOSIS — M79661 Pain in right lower leg: Secondary | ICD-10-CM | POA: Diagnosis not present

## 2016-08-15 DIAGNOSIS — E785 Hyperlipidemia, unspecified: Secondary | ICD-10-CM | POA: Diagnosis present

## 2016-08-15 DIAGNOSIS — Z6841 Body Mass Index (BMI) 40.0 and over, adult: Secondary | ICD-10-CM

## 2016-08-15 DIAGNOSIS — I110 Hypertensive heart disease with heart failure: Secondary | ICD-10-CM | POA: Diagnosis not present

## 2016-08-15 DIAGNOSIS — I480 Paroxysmal atrial fibrillation: Secondary | ICD-10-CM | POA: Diagnosis not present

## 2016-08-15 DIAGNOSIS — M25561 Pain in right knee: Secondary | ICD-10-CM | POA: Diagnosis not present

## 2016-08-15 DIAGNOSIS — Z8249 Family history of ischemic heart disease and other diseases of the circulatory system: Secondary | ICD-10-CM

## 2016-08-15 DIAGNOSIS — R296 Repeated falls: Secondary | ICD-10-CM | POA: Diagnosis present

## 2016-08-15 DIAGNOSIS — I482 Chronic atrial fibrillation, unspecified: Secondary | ICD-10-CM

## 2016-08-15 DIAGNOSIS — Z9981 Dependence on supplemental oxygen: Secondary | ICD-10-CM | POA: Diagnosis not present

## 2016-08-15 DIAGNOSIS — G8929 Other chronic pain: Secondary | ICD-10-CM | POA: Diagnosis not present

## 2016-08-15 DIAGNOSIS — Z7189 Other specified counseling: Secondary | ICD-10-CM | POA: Diagnosis not present

## 2016-08-15 DIAGNOSIS — M1710 Unilateral primary osteoarthritis, unspecified knee: Secondary | ICD-10-CM | POA: Diagnosis present

## 2016-08-15 DIAGNOSIS — M810 Age-related osteoporosis without current pathological fracture: Secondary | ICD-10-CM | POA: Diagnosis present

## 2016-08-15 DIAGNOSIS — Z96653 Presence of artificial knee joint, bilateral: Secondary | ICD-10-CM | POA: Diagnosis present

## 2016-08-15 DIAGNOSIS — T402X5A Adverse effect of other opioids, initial encounter: Secondary | ICD-10-CM | POA: Diagnosis present

## 2016-08-15 DIAGNOSIS — D6959 Other secondary thrombocytopenia: Secondary | ICD-10-CM | POA: Diagnosis not present

## 2016-08-15 DIAGNOSIS — R600 Localized edema: Secondary | ICD-10-CM

## 2016-08-15 DIAGNOSIS — Z79899 Other long term (current) drug therapy: Secondary | ICD-10-CM | POA: Diagnosis not present

## 2016-08-15 DIAGNOSIS — R0602 Shortness of breath: Secondary | ICD-10-CM | POA: Diagnosis not present

## 2016-08-15 DIAGNOSIS — Z7982 Long term (current) use of aspirin: Secondary | ICD-10-CM

## 2016-08-15 DIAGNOSIS — IMO0002 Reserved for concepts with insufficient information to code with codable children: Secondary | ICD-10-CM | POA: Diagnosis present

## 2016-08-15 DIAGNOSIS — B9689 Other specified bacterial agents as the cause of diseases classified elsewhere: Secondary | ICD-10-CM | POA: Diagnosis not present

## 2016-08-15 DIAGNOSIS — Z66 Do not resuscitate: Secondary | ICD-10-CM | POA: Diagnosis not present

## 2016-08-15 DIAGNOSIS — L03319 Cellulitis of trunk, unspecified: Secondary | ICD-10-CM | POA: Diagnosis not present

## 2016-08-15 DIAGNOSIS — M7989 Other specified soft tissue disorders: Secondary | ICD-10-CM | POA: Diagnosis not present

## 2016-08-15 DIAGNOSIS — D696 Thrombocytopenia, unspecified: Secondary | ICD-10-CM | POA: Diagnosis present

## 2016-08-15 DIAGNOSIS — R509 Fever, unspecified: Secondary | ICD-10-CM | POA: Diagnosis not present

## 2016-08-15 DIAGNOSIS — E782 Mixed hyperlipidemia: Secondary | ICD-10-CM

## 2016-08-15 DIAGNOSIS — L0390 Cellulitis, unspecified: Secondary | ICD-10-CM | POA: Diagnosis present

## 2016-08-15 LAB — COMPREHENSIVE METABOLIC PANEL
ALBUMIN: 2.6 g/dL — AB (ref 3.5–5.0)
ALT: 65 U/L — ABNORMAL HIGH (ref 14–54)
ANION GAP: 6 (ref 5–15)
AST: 19 U/L (ref 15–41)
Alkaline Phosphatase: 56 U/L (ref 38–126)
BILIRUBIN TOTAL: 0.5 mg/dL (ref 0.3–1.2)
BUN: 25 mg/dL — ABNORMAL HIGH (ref 6–20)
CHLORIDE: 102 mmol/L (ref 101–111)
CO2: 29 mmol/L (ref 22–32)
Calcium: 8.6 mg/dL — ABNORMAL LOW (ref 8.9–10.3)
Creatinine, Ser: 0.8 mg/dL (ref 0.44–1.00)
GFR calc Af Amer: >60 mL/min (ref >60)
GFR calc non Af Amer: >60 mL/min (ref >60)
GLUCOSE: 121 mg/dL — AB (ref 65–99)
POTASSIUM: 3.5 mmol/L (ref 3.5–5.1)
SODIUM: 137 mmol/L (ref 135–145)
TOTAL PROTEIN: 5.8 g/dL — AB (ref 6.5–8.1)

## 2016-08-15 LAB — I-STAT CG4 LACTIC ACID, ED: Lactic Acid, Venous: 0.99 mmol/L (ref 0.5–1.9)

## 2016-08-15 MED ORDER — VANCOMYCIN HCL IN DEXTROSE 1-5 GM/200ML-% IV SOLN
1000.0000 mg | Freq: Once | INTRAVENOUS | Status: AC
  Administered 2016-08-16: 1000 mg via INTRAVENOUS
  Filled 2016-08-15: qty 200

## 2016-08-15 MED ORDER — PIPERACILLIN-TAZOBACTAM 3.375 G IVPB 30 MIN
3.3750 g | Freq: Once | INTRAVENOUS | Status: AC
  Administered 2016-08-16: 3.375 g via INTRAVENOUS
  Filled 2016-08-15: qty 50

## 2016-08-15 MED ORDER — SODIUM CHLORIDE 0.9 % IV SOLN
1000.0000 mL | INTRAVENOUS | Status: DC
  Administered 2016-08-16: 1000 mL via INTRAVENOUS

## 2016-08-15 NOTE — ED Notes (Signed)
Bed: WA03 Expected date:  Expected time:  Means of arrival:  Comments: 82 yo F Cellulits to leg

## 2016-08-15 NOTE — ED Triage Notes (Signed)
Per EMS, pt is from [facility] assisted living with complaints of cellulitis of RLE. EMS reports pt having bilateral edema in lower extremities. Family has requested iv antibiotics and iv lasix. Per EMS pt was started on prednisone today by a facility provider. EMS states that family requested pt be seen today in the ED.

## 2016-08-15 NOTE — Progress Notes (Signed)
Patient ID: Norma Ford, female   DOB: [birth year], 82 y.o.   MRN: [id number] : Provider:   Location:  [place] Room Number: 111 P Place of Service:  SNF (31)  PCP: Ann Held, DO Patient Care Team: Ann Held, DO as PCP - General  Extended Emergency Contact Information Primary Emergency Contact: Steele,Lois  [place] Mobile Phone: [phone] Relation: Daughter Secondary Emergency Contact: Carleene Over[place] Mobile Phone: [phone] Relation: Daughter     Allergies: Codeine  Chief Complaint  Patient presents with  . Acute Visit    redness to right upper extremity, warm to touch and tenderness noted    HPI: Patient is 82 y.o. female who  Past Medical History:  Diagnosis Date  . Adrenal tumor 2002   right, followed by Surgery  . Barrett's esophagus 1999  . Cataracts, bilateral   . DECUBITUS ULCER, BUTTOCK 12/21/2009  . History of abnormal mammogram    2001-h/o abnormal mammo-told just calcium deposit-repeat  . HTN (hypertension)   . Hyperlipidemia   . Kidney stone   . MYCOSIS FUNGOIDES LYMPH NODES MULTIPLE SITES 09/21/2009  . THROMBOCYTOPENIA 12/21/2009    Past Surgical History:  Procedure Laterality Date  . ABDOMINAL HYSTERECTOMY    . appendectomy    . ESOPHAGOGASTRODUODENOSCOPY  03/2002  . TONSILLECTOMY    . TOTAL KNEE ARTHROPLASTY     left 2009, right 2003  . tubal ligation        Medication List       Accurate as of 08/15/16  1:42 PM. Always use your most recent med list.          ALPRAZolam 0.25 MG tablet Commonly known as:  XANAX Take 1 tablet (0.25 mg total) by mouth 2 (two) times daily as needed for anxiety.   aspirin 325 MG EC tablet Take 1 tablet (325 mg total) by mouth daily.   diltiazem 120 MG 12 hr capsule Commonly known as:  CARDIZEM SR Take 1 capsule (120 mg total) by mouth every 12 (twelve) hours.   FISH OIL PO Take 1 tablet by mouth  2 (two) times daily.   fluconazole 100 MG tablet Commonly known as:  DIFLUCAN Take 100 mg by mouth daily.   furosemide 40 MG tablet Commonly known as:  LASIX Take 1 tablet (40 mg total) by mouth daily.   HYDROcodone-acetaminophen 5-325 MG tablet Commonly known as:  NORCO/VICODIN Take 1 tablet by mouth every 4 (four) hours as needed for moderate pain.   metoprolol 50 MG tablet Commonly known as:  LOPRESSOR Take 1 tablet (50 mg total) by mouth 2 (two) times daily.   mometasone 50 MCG/ACT nasal spray Commonly known as:  NASONEX Place 2 sprays into the nose daily as needed (for congestion).   multivitamin tablet Take 1 tablet by mouth daily.   polyethylene glycol powder powder Commonly known as:  GLYCOLAX/MIRALAX Take 17 g by mouth 2 (two) times daily as needed.   potassium chloride SA 20 MEQ tablet Commonly known as:  K-DUR,KLOR-CON Take 1 tablet (20 mEq total) by mouth daily.   predniSONE 20 MG tablet Commonly known as:  DELTASONE Take 1 tablet (20 mg total) by mouth daily with breakfast.   sertraline 50 MG tablet Commonly known as:  ZOLOFT Take 1 tablet by mouth  daily   simvastatin 80 MG tablet Commonly known as:  ZOCOR Take 1 tablet by mouth at  bedtime  Meds ordered this encounter  Medications  . fluconazole (DIFLUCAN) 100 MG tablet    Sig: Take 100 mg by mouth daily.    Immunization History  Administered Date(s) Administered  . Influenza Whole 09/23/2010  . Influenza, High Dose Seasonal PF 07/07/2015  . PPD Test 08/10/2016  . Pneumococcal Conjugate-13 07/07/2015  . Pneumococcal Polysaccharide-23 09/23/2010    Social History  Substance Use Topics  . Smoking status: Never Smoker  . Smokeless tobacco: Never Used  . Alcohol use No    Family history is   Family History  Problem Relation Age of Onset  . Heart disease Mother   . Heart disease Father     pacemaker  . Kidney disease Father       Review of Systems  DATA OBTAINED: from  patient, nurse, medical record, family member GENERAL:  no fevers, fatigue, appetite changes SKIN: No itching, or rash EYES: No eye pain, redness, discharge EARS: No earache, tinnitus, change in hearing NOSE: No congestion, drainage or bleeding  MOUTH/THROAT: No mouth or tooth pain, No sore throat RESPIRATORY: No cough, wheezing, SOB CARDIAC: No chest pain, palpitations, lower extremity edema  GI: No abdominal pain, No N/V/D or constipation, No heartburn or reflux  GU: No dysuria, frequency or urgency, or incontinence  MUSCULOSKELETAL: No unrelieved bone/joint pain NEUROLOGIC: No headache, dizziness or focal weakness PSYCHIATRIC: No c/o anxiety or sadness   Vitals:   08/15/16 1320  BP: 130/70  Pulse: 82  Resp: 18  Temp: 97.9 F (36.6 C)    SpO2 Readings from Last 1 Encounters:  08/15/16 93%   There is no height or weight on file to calculate BMI.     Physical Exam  GENERAL APPEARANCE: Alert, conversant,  No acute distress.  SKIN: No diaphoresis rash HEAD: Normocephalic, atraumatic  EYES: Conjunctiva/lids clear. Pupils round, reactive. EOMs intact.  EARS: External exam WNL, canals clear. Hearing grossly normal.  NOSE: No deformity or discharge.  MOUTH/THROAT: Lips w/o lesions  RESPIRATORY: Breathing is even, unlabored. Lung sounds are clear   CARDIOVASCULAR: Heart RRR no murmurs, rubs or gallops. No peripheral edema.   GASTROINTESTINAL: Abdomen is soft, non-tender, not distended w/ normal bowel sounds. GENITOURINARY: Bladder non tender, not distended  MUSCULOSKELETAL: No abnormal joints or musculature NEUROLOGIC:  Cranial nerves 2-12 grossly intact. Moves all extremities  PSYCHIATRIC: Mood and affect appropriate to situation, no behavioral issues  Patient Active Problem List   Diagnosis Date Noted  . HCAP (healthcare-associated pneumonia) 08/12/2016  . Acute respiratory failure with hypoxia ([place]) 08/12/2016  . Acute on chronic diastolic heart failure ([place])  08/12/2016  . Closed fracture of phalanx of toe of right foot, initial encounter   . Acute diastolic CHF 0000000  . Atrial fibrillation with rapid ventricular response ([place]) 08/01/2016  . Fall 08/01/2016  . Thigh pain 08/01/2016  . Toe fracture, right 08/01/2016  . Atrial fibrillation with RVR ([place]) 08/01/2016  . Depression 08/01/2016  . Right leg pain 07/01/2016  . Frequent falls 02/14/2016  . History of fall 11/14/2015  . Osteoarthritis of both knees 05/15/2015  . Morbid obesity ([place]) 02/24/2014  . Cellulitis and abscess of lower leg 05/21/2012  . Calf pain 05/21/2012  . Edema 05/21/2012  . Chronic bronchitis 11/03/2011  . Hypoxia 10/20/2011  . THROMBOCYTOPENIA 12/21/2009  . DECUBITUS ULCER, BUTTOCK 12/21/2009  . MYCOSIS FUNGOIDES LYMPH NODES MULTIPLE SITES 09/21/2009  . DEGENERATIVE JOINT DISEASE, KNEE 09/21/2009  . SHOULDER PAIN, RIGHT 09/21/2009  . FEMALE STRESS INCONTINENCE 01/16/2009  . HLD (hyperlipidemia)  02/12/2008  . Hypertensive heart disease with CHF ([place]) 02/12/2008      Labs reviewed: Basic Metabolic Panel:    Component Value Date/Time   NA 137 08/10/2016 0523   NA 137 08/10/2016   K 4.5 08/10/2016 0523   CL 98 (L) 08/10/2016 0523   CO2 32 08/10/2016 0523   GLUCOSE 129 (H) 08/10/2016 0523   BUN 36 (H) 08/10/2016 0523   BUN 36 (A) 08/10/2016   CREATININE 0.79 08/10/2016 0523   CALCIUM 8.5 (L) 08/10/2016 0523   PROT 7.0 07/04/2016 1700   ALBUMIN 3.7 07/04/2016 1700   AST 14 07/04/2016 1700   ALT 9 07/04/2016 1700   ALKPHOS 41 07/04/2016 1700   BILITOT 0.3 07/04/2016 1700   GFRNONAA >60 08/10/2016 0523   GFRAA >60 08/10/2016 0523     Recent Labs  08/01/16 0418  08/03/16 0401  08/08/16 0428 08/09/16 08/09/16 0328 08/10/16 08/10/16 0523  NA 139  < > 138  < > 138 138 138 137 137  K 4.0  < > 4.1  < > 3.8 4.6 4.6  --  4.5  CL 106  < > 101  < > 97*  --  98*  --  98*  CO2 26  < > 30  < > 35*  --  35*  --  32  GLUCOSE 99  < > 100*  < > 132*   --  142*  --  129*  BUN 24*  < > 21*  < > 32* 30* 30* 36* 36*  CREATININE 0.78  < > 0.78  < > 0.68 0.8 0.76 0.8 0.79  CALCIUM 9.0  < > 8.5*  < > 8.5*  --  8.8*  --  8.5*  MG 2.1  --  2.0  --   --   --   --   --   --   < > = values in this interval not displayed. Liver Function Tests:  Recent Labs  10/22/15 1233 02/11/16 1230 07/04/16 1700  AST 14 13 14   ALT 10 9 9   ALKPHOS 52 52 41  BILITOT 0.3 0.4 0.3  PROT 6.7 6.6 7.0  ALBUMIN 3.5 3.5 3.7   No results for input(s): LIPASE, AMYLASE in the last 8760 hours. No results for input(s): AMMONIA in the last 8760 hours. CBC:  Recent Labs  02/11/16 1230 07/04/16 1700  08/01/16 0021  08/05/16 0507 08/06/16 08/06/16 0508 08/07/16 08/07/16 0511  WBC 5.0 5.4  < > 6.6  < > 4.9 12.0 12.0* 13.0 13.0*  NEUTROABS 3.1 3.2  --  4.9  --   --   --   --   --   --   HGB 12.8 13.4  < > 12.1  < > 11.8* 12.7 12.7  --  12.4  HCT 38.0 39.7  < > 36.9  < > 36.5 36 35.8*  --  38.3  MCV 96.4 97.7  --  98.7  < > 99.2  --  95.2  --  98.2  PLT 165.0 151.0  < > 168  < > 242 287 287  --  313  < > = values in this interval not displayed. Lipid  Recent Labs  10/22/15 1233 08/01/16 0418  CHOL 183 162  HDL 42.20 38*  LDLCALC 111* 110*  TRIG 146.0 69    Cardiac Enzymes:  Recent Labs  08/01/16 0418 08/01/16 1032 08/09/16 1046  TROPONINI <0.03 <0.03 <0.03   BNP:  Recent Labs  08/01/16 0418  BNP 177.2*   Lab Results  Component Value Date   MICROALBUR 0.7 11/10/2014   Lab Results  Component Value Date   HGBA1C 5.2 07/04/2016   Lab Results  Component Value Date   TSH 1.812 08/01/2016   Lab Results  Component Value Date   [id number] (H) 01/18/2010   No results found for: FOLATE Lab Results  Component Value Date   FERRITIN 133 01/18/2010    Imaging and Procedures obtained prior to SNF admission: Dg Chest Portable 1 View  Result Date: 08/01/2016 CLINICAL DATA:  Generalized weakness for 2 weeks. Fell at home tonight. Leg  pain. History of hypertension, hyperlipidemia. EXAM: PORTABLE CHEST 1 VIEW COMPARISON:  Chest radiograph November 10, 2014 FINDINGS: Cardiac silhouette is mildly enlarged, even with CAD considerations low inspiratory examination. Pulmonary vascular congestion interstitial prominence. Small bilateral pleural effusions. No pneumothorax. Severe degenerative change of the shoulders. IMPRESSION: Mild cardiomegaly.  Interstitial edema with small pleural effusions. Electronically Signed   By: Elon Alas M.D.   On: 08/01/2016 01:51   Dg Foot Complete Right  Result Date: 08/01/2016 CLINICAL DATA:  Acute onset of right foot pain, especially at the great toe, after fall. Initial encounter. EXAM: RIGHT FOOT COMPLETE - 3+ VIEW COMPARISON:  None. FINDINGS: There is a comminuted fracture involving the first proximal phalanx, with 2 fracture lines extending to the first metatarsophalangeal joint, and mild impaction of a central fragment. No additional fractures are seen. There is no evidence of talar subluxation; the subtalar joint is unremarkable in appearance. A plantar calcaneal spur is noted. Diffuse dorsal soft tissue swelling is noted at the foot, with scattered soft tissue calcifications. IMPRESSION: 1. Comminuted fracture of the first proximal phalanx, with 2 fracture lines extending to the first metatarsophalangeal joint, and mild impaction of a central fragment. 2. Diffuse dorsal soft tissue swelling, with scattered soft tissue calcifications. Electronically Signed   By: Garald Balding M.D.   On: 08/01/2016 01:01   Dg Femur Min 2 Views Left  Result Date: 08/01/2016 CLINICAL DATA:  82 year old female with bilateral leg pain and fall. EXAM: LEFT FEMUR 2 VIEWS COMPARISON:  Right femur radiograph dated 07/31/2016 FINDINGS: There is no acute fracture or dislocation. The bones are osteopenic. There is a total left knee arthroplasty. There are osteoarthritic changes of the left hip. There is spurring of the superior  patella along the insertion of the quadriceps tendon. No significant joint effusion. The soft tissues are grossly unremarkable. IMPRESSION: No acute fracture or dislocation. Electronically Signed   By: Anner Crete M.D.   On: 08/01/2016 00:59   Dg Femur, Min 2 Views Right  Result Date: 08/01/2016 CLINICAL DATA:  Status post fall, with bilateral femur pain. Initial encounter. EXAM: RIGHT FEMUR 2 VIEWS COMPARISON:  None. FINDINGS: There is no evidence of fracture or dislocation. The right femur appears grossly intact. The right hip joint is grossly unremarkable. The patient's right knee arthroplasty is grossly unremarkable in appearance, without evidence of loosening. No definite soft tissue abnormalities are characterized on radiograph. No knee joint effusion is seen. IMPRESSION: No evidence of fracture or dislocation. Right knee arthroplasty is unremarkable in appearance. Electronically Signed   By: Garald Balding M.D.   On: 08/01/2016 01:03     Not all labs, radiology exams or other studies done during hospitalization come through on my EPIC note; however they are reviewed by me.    Assessment and Plan  No problem-specific Assessment & Plan notes found for this encounter.   Kiera Hussey,  Valentino Nose, MD

## 2016-08-15 NOTE — Progress Notes (Addendum)
Patient ID: KESLEIGH SVATEK, female   DOB: September 17, 1930, 82 y.o.   MRN: [id number]  : Provider:   Location:  [facility] Room Number: 111 P  Place of Service:  SNF (31)  PCP: Ann Held, DO Patient Care Team: Ann Held, DO as PCP - General  Extended Emergency Contact Information Primary Emergency Contact: Minnich,Lois  [place] Mobile Phone: [phone] Relation: Daughter Secondary Emergency Contact: Carleene Overlie [place] Mobile Phone: [phone] Relation: Daughter     Allergies: Codeine  Chief Complaint  Patient presents with  . Acute Visit    HPI: Patient is 82 y.o. female who nursing asked me to see for a possible cellulitis. Noted slt redness yesterday, worse today. Pt admits it is TTP.Nursing reports no fever. Nothing makes it better or worse.  Past Medical History:  Diagnosis Date  . Adrenal tumor 2002   right, followed by Surgery  . Barrett's esophagus 1999  . Cataracts, bilateral   . DECUBITUS ULCER, BUTTOCK 12/21/2009  . History of abnormal mammogram    2001-h/o abnormal mammo-told just calcium deposit-repeat  . HTN (hypertension)   . Hyperlipidemia   . Kidney stone   . MYCOSIS FUNGOIDES LYMPH NODES MULTIPLE SITES 09/21/2009  . THROMBOCYTOPENIA 12/21/2009    Past Surgical History:  Procedure Laterality Date  . ABDOMINAL HYSTERECTOMY    . appendectomy    . ESOPHAGOGASTRODUODENOSCOPY  03/2002  . TONSILLECTOMY    . TOTAL KNEE ARTHROPLASTY     left 2009, right 2003  . tubal ligation        Medication List       Accurate as of 08/15/16  1:55 PM. Always use your most recent med list.          ALPRAZolam 0.25 MG tablet Commonly known as:  XANAX Take 1 tablet (0.25 mg total) by mouth 2 (two) times daily as needed for anxiety.   aspirin 325 MG EC tablet Take 1 tablet (325 mg total) by mouth daily.   diltiazem 120 MG 12 hr capsule Commonly known as:  CARDIZEM  SR Take 1 capsule (120 mg total) by mouth every 12 (twelve) hours.   FISH OIL PO Take 1 tablet by mouth 2 (two) times daily.   fluconazole 100 MG tablet Commonly known as:  DIFLUCAN Take 100 mg by mouth daily.   furosemide 40 MG tablet Commonly known as:  LASIX Take 1 tablet (40 mg total) by mouth daily.   HYDROcodone-acetaminophen 5-325 MG tablet Commonly known as:  NORCO/VICODIN Take 1 tablet by mouth every 4 (four) hours as needed for moderate pain.   metoprolol 50 MG tablet Commonly known as:  LOPRESSOR Take 1 tablet (50 mg total) by mouth 2 (two) times daily.   mometasone 50 MCG/ACT nasal spray Commonly known as:  NASONEX Place 2 sprays into the nose daily as needed (for congestion).   multivitamin tablet Take 1 tablet by mouth daily.   polyethylene glycol powder powder Commonly known as:  GLYCOLAX/MIRALAX Take 17 g by mouth 2 (two) times daily as needed.   potassium chloride SA 20 MEQ tablet Commonly known as:  K-DUR,KLOR-CON Take 1 tablet (20 mEq total) by mouth daily.   predniSONE 20 MG tablet Commonly known as:  DELTASONE Take 1 tablet (20 mg total) by mouth daily with breakfast.   sertraline 50 MG tablet Commonly known as:  ZOLOFT Take 1 tablet by mouth  daily   simvastatin 80 MG  tablet Commonly known as:  ZOCOR Take 1 tablet by mouth at  bedtime       No orders of the defined types were placed in this encounter.   Immunization History  Administered Date(s) Administered  . Influenza Whole 09/23/2010  . Influenza, High Dose Seasonal PF 07/07/2015  . PPD Test 08/10/2016  . Pneumococcal Conjugate-13 07/07/2015  . Pneumococcal Polysaccharide-23 09/23/2010    Social History  Substance Use Topics  . Smoking status: Never Smoker  . Smokeless tobacco: Never Used  . Alcohol use No    Family history is   Family History  Problem Relation Age of Onset  . Heart disease Mother   . Heart disease Father     pacemaker  . Kidney disease Father        Review of Systems  DATA OBTAINED: from patient, nurse GENERAL:  no fevers, fatigue, appetite changes SKIN: + rash EYES: No eye pain, redness, discharge EARS: No earache, tinnitus, change in hearing NOSE: No congestion, drainage or bleeding  MOUTH/THROAT: No mouth or tooth pain, No sore throat RESPIRATORY: No cough, wheezing, SOB CARDIAC: No chest pain, palpitations, lower extremity edema  GI: No abdominal pain, No N/V/D or constipation, No heartburn or reflux  GU: No dysuria, frequency or urgency, or incontinence  MUSCULOSKELETAL: No unrelieved bone/joint pain NEUROLOGIC: No headache, dizziness or focal weakness PSYCHIATRIC: No c/o anxiety or sadness   Vitals:   08/15/16 1354  BP: 130/70  Pulse: 82  Resp: 18  Temp: 97.9 F (36.6 C)    SpO2 Readings from Last 1 Encounters:  08/15/16 93%   There is no height or weight on file to calculate BMI.     Physical Exam  GENERAL APPEARANCE: Alert, conversant,  No acute distress. obese WF SKIN: RLE with redness, heat, swelling HEAD: Normocephalic, atraumatic  EYES: Conjunctiva/lids clear. Pupils round, reactive. EOMs intact.  EARS: External exam WNL, canals clear. Hearing grossly normal.  NOSE: No deformity or discharge.  MOUTH/THROAT: Lips w/o lesions  RESPIRATORY: Breathing is even, unlabored. Lung sounds are clear   CARDIOVASCULAR: Heart RRR no murmurs, rubs or gallops. 1+ B peripheral edema.   GASTROINTESTINAL: Abdomen is soft, non-tender, not distended w/ normal bowel sounds. GENITOURINARY: Bladder non tender, not distended  MUSCULOSKELETAL: No abnormal joints or musculature NEUROLOGIC:  Cranial nerves 2-12 grossly intact. Moves all extremities  PSYCHIATRIC: Mood and affect appropriate to situation, no behavioral issues  Patient Active Problem List   Diagnosis Date Noted  . HCAP (healthcare-associated pneumonia) 08/12/2016  . Acute respiratory failure with hypoxia ([facility]) 08/12/2016  . Acute on chronic diastolic  heart failure ([place]) 08/12/2016  . Closed fracture of phalanx of toe of right foot, initial encounter   . Acute diastolic CHF 0000000  . Atrial fibrillation with rapid ventricular response ([facility]) 08/01/2016  . Fall 08/01/2016  . Thigh pain 08/01/2016  . Toe fracture, right 08/01/2016  . Atrial fibrillation with RVR ([facility]) 08/01/2016  . Depression 08/01/2016  . Right leg pain 07/01/2016  . Frequent falls 02/14/2016  . History of fall 11/14/2015  . Osteoarthritis of both knees 05/15/2015  . Morbid obesity ([place]) 02/24/2014  . Cellulitis and abscess of lower leg 05/21/2012  . Calf pain 05/21/2012  . Edema 05/21/2012  . Chronic bronchitis 11/03/2011  . Hypoxia 10/20/2011  . THROMBOCYTOPENIA 12/21/2009  . DECUBITUS ULCER, BUTTOCK 12/21/2009  . MYCOSIS FUNGOIDES LYMPH NODES MULTIPLE SITES 09/21/2009  . DEGENERATIVE JOINT DISEASE, KNEE 09/21/2009  . SHOULDER PAIN, RIGHT 09/21/2009  . FEMALE STRESS INCONTINENCE  01/16/2009  . HLD (hyperlipidemia) 02/12/2008  . Hypertensive heart disease with CHF ([facility]) 02/12/2008      Labs reviewed: Basic Metabolic Panel:    Component Value Date/Time   NA 137 08/10/2016 0523   NA 137 08/10/2016   K 4.5 08/10/2016 0523   CL 98 (L) 08/10/2016 0523   CO2 32 08/10/2016 0523   GLUCOSE 129 (H) 08/10/2016 0523   BUN 36 (H) 08/10/2016 0523   BUN 36 (A) 08/10/2016   CREATININE 0.79 08/10/2016 0523   CALCIUM 8.5 (L) 08/10/2016 0523   PROT 7.0 07/04/2016 1700   ALBUMIN 3.7 07/04/2016 1700   AST 14 07/04/2016 1700   ALT 9 07/04/2016 1700   ALKPHOS 41 07/04/2016 1700   BILITOT 0.3 07/04/2016 1700   GFRNONAA >60 08/10/2016 0523   GFRAA >60 08/10/2016 0523     Recent Labs  08/01/16 0418  08/03/16 0401  08/08/16 0428 08/09/16 08/09/16 0328 08/10/16 08/10/16 0523  NA 139  < > 138  < > 138 138 138 137 137  K 4.0  < > 4.1  < > 3.8 4.6 4.6  --  4.5  CL 106  < > 101  < > 97*  --  98*  --  98*  CO2 26  < > 30  < > 35*  --  35*  --  32  GLUCOSE 99  <  > 100*  < > 132*  --  142*  --  129*  BUN 24*  < > 21*  < > 32* 30* 30* 36* 36*  CREATININE 0.78  < > 0.78  < > 0.68 0.8 0.76 0.8 0.79  CALCIUM 9.0  < > 8.5*  < > 8.5*  --  8.8*  --  8.5*  MG 2.1  --  2.0  --   --   --   --   --   --   < > = values in this interval not displayed. Liver Function Tests:  Recent Labs  10/22/15 1233 02/11/16 1230 07/04/16 1700  AST 14 13 14   ALT 10 9 9   ALKPHOS 52 52 41  BILITOT 0.3 0.4 0.3  PROT 6.7 6.6 7.0  ALBUMIN 3.5 3.5 3.7   No results for input(s): LIPASE, AMYLASE in the last 8760 hours. No results for input(s): AMMONIA in the last 8760 hours. CBC:  Recent Labs  02/11/16 1230 07/04/16 1700  08/01/16 0021  08/05/16 0507 08/06/16 08/06/16 0508 08/07/16 08/07/16 0511  WBC 5.0 5.4  < > 6.6  < > 4.9 12.0 12.0* 13.0 13.0*  NEUTROABS 3.1 3.2  --  4.9  --   --   --   --   --   --   HGB 12.8 13.4  < > 12.1  < > 11.8* 12.7 12.7  --  12.4  HCT 38.0 39.7  < > 36.9  < > 36.5 36 35.8*  --  38.3  MCV 96.4 97.7  --  98.7  < > 99.2  --  95.2  --  98.2  PLT 165.0 151.0  < > 168  < > 242 287 287  --  313  < > = values in this interval not displayed. Lipid  Recent Labs  10/22/15 1233 08/01/16 0418  CHOL 183 162  HDL 42.20 38*  LDLCALC 111* 110*  TRIG 146.0 69    Cardiac Enzymes:  Recent Labs  08/01/16 0418 08/01/16 1032 08/09/16 1046  TROPONINI <0.03 <0.03 <0.03   BNP:  Recent  Labs  08/01/16 0418  BNP 177.2*   Lab Results  Component Value Date   MICROALBUR 0.7 11/10/2014   Lab Results  Component Value Date   HGBA1C 5.2 07/04/2016   Lab Results  Component Value Date   TSH 1.812 08/01/2016   Lab Results  Component Value Date   [id number] (H) 01/18/2010   No results found for: FOLATE Lab Results  Component Value Date   FERRITIN 133 01/18/2010    Imaging and Procedures obtained prior to SNF admission: Dg Chest Portable 1 View  Result Date: 08/01/2016 CLINICAL DATA:  Generalized weakness for 2 weeks. Fell at  home tonight. Leg pain. History of hypertension, hyperlipidemia. EXAM: PORTABLE CHEST 1 VIEW COMPARISON:  Chest radiograph November 10, 2014 FINDINGS: Cardiac silhouette is mildly enlarged, even with CAD considerations low inspiratory examination. Pulmonary vascular congestion interstitial prominence. Small bilateral pleural effusions. No pneumothorax. Severe degenerative change of the shoulders. IMPRESSION: Mild cardiomegaly.  Interstitial edema with small pleural effusions. Electronically Signed   By: Elon Alas M.D.   On: 08/01/2016 01:51   Dg Foot Complete Right  Result Date: 08/01/2016 CLINICAL DATA:  Acute onset of right foot pain, especially at the great toe, after fall. Initial encounter. EXAM: RIGHT FOOT COMPLETE - 3+ VIEW COMPARISON:  None. FINDINGS: There is a comminuted fracture involving the first proximal phalanx, with 2 fracture lines extending to the first metatarsophalangeal joint, and mild impaction of a central fragment. No additional fractures are seen. There is no evidence of talar subluxation; the subtalar joint is unremarkable in appearance. A plantar calcaneal spur is noted. Diffuse dorsal soft tissue swelling is noted at the foot, with scattered soft tissue calcifications. IMPRESSION: 1. Comminuted fracture of the first proximal phalanx, with 2 fracture lines extending to the first metatarsophalangeal joint, and mild impaction of a central fragment. 2. Diffuse dorsal soft tissue swelling, with scattered soft tissue calcifications. Electronically Signed   By: Garald Balding M.D.   On: 08/01/2016 01:01   Dg Femur Min 2 Views Left  Result Date: 08/01/2016 CLINICAL DATA:  82 year old female with bilateral leg pain and fall. EXAM: LEFT FEMUR 2 VIEWS COMPARISON:  Right femur radiograph dated 07/31/2016 FINDINGS: There is no acute fracture or dislocation. The bones are osteopenic. There is a total left knee arthroplasty. There are osteoarthritic changes of the left hip. There is  spurring of the superior patella along the insertion of the quadriceps tendon. No significant joint effusion. The soft tissues are grossly unremarkable. IMPRESSION: No acute fracture or dislocation. Electronically Signed   By: Anner Crete M.D.   On: 08/01/2016 00:59   Dg Femur, Min 2 Views Right  Result Date: 08/01/2016 CLINICAL DATA:  Status post fall, with bilateral femur pain. Initial encounter. EXAM: RIGHT FEMUR 2 VIEWS COMPARISON:  None. FINDINGS: There is no evidence of fracture or dislocation. The right femur appears grossly intact. The right hip joint is grossly unremarkable. The patient's right knee arthroplasty is grossly unremarkable in appearance, without evidence of loosening. No definite soft tissue abnormalities are characterized on radiograph. No knee joint effusion is seen. IMPRESSION: No evidence of fracture or dislocation. Right knee arthroplasty is unremarkable in appearance. Electronically Signed   By: Garald Balding M.D.   On: 08/01/2016 01:03     Not all labs, radiology exams or other studies done during hospitalization come through on my EPIC note; however they are reviewed by me.    Assessment and Plan  RLE CELLULITIS  - doxycyclne 100 mg BID  for 14 days with floraster for 20 days; I don't think swelling represents a DVT but will monitor  BLE EDEMA - have increased lasix to 80 mg daily and started KCL 40 meq po daily     Time spent > 35 min Inocencio Homes, MD

## 2016-08-15 NOTE — Progress Notes (Signed)
Patient ID: Norma Ford, female   DOB: Jan 31, 1930, 82 y.o.   MRN: [id number]  Location:  [facility] Room Number: [place] Service:  SNF [phone]) Provider:   Ann Held, DO  Patient Care Team: Ann Held, DO as PCP - General  Extended Emergency Contact Information Primary Emergency Contact: Mattox,Lois  [place] Mobile Phone: [phone] Relation: Daughter Secondary Emergency Contact: Carleene Overlie [place] Mobile Phone: [phone] Relation: Daughter  Code Status:   Goals of care: Advanced Directive information Advanced Directives 08/12/2016  Does patient have an advance directive? Yes  Type of Advance Directive Out of facility DNR (pink MOST or yellow form)  Does patient want to make changes to advanced directive? No - Patient declined  Copy of advanced directive(s) in chart? Yes  Some encounter information is confidential and restricted. Go to Review Flowsheets activity to see all data.     Chief Complaint  Patient presents with  . Acute Visit    redness to right upper extremity, warm to touch and tenderness noted    HPI:  Pt is a 82 y.o. female seen today for an acute visit for    Past Medical History:  Diagnosis Date  . Adrenal tumor 2002   right, followed by Surgery  . Barrett's esophagus 1999  . Cataracts, bilateral   . DECUBITUS ULCER, BUTTOCK 12/21/2009  . History of abnormal mammogram    2001-h/o abnormal mammo-told just calcium deposit-repeat  . HTN (hypertension)   . Hyperlipidemia   . Kidney stone   . MYCOSIS FUNGOIDES LYMPH NODES MULTIPLE SITES 09/21/2009  . THROMBOCYTOPENIA 12/21/2009   Past Surgical History:  Procedure Laterality Date  . ABDOMINAL HYSTERECTOMY    . appendectomy    . ESOPHAGOGASTRODUODENOSCOPY  03/2002  . TONSILLECTOMY    . TOTAL KNEE ARTHROPLASTY     left 2009, right 2003  . tubal ligation      Allergies  Allergen Reactions  .  Codeine     REACTION: ITCHING      Medication List       Accurate as of 08/15/16  1:31 PM. Always use your most recent med list.          ALPRAZolam 0.25 MG tablet Commonly known as:  XANAX Take 1 tablet (0.25 mg total) by mouth 2 (two) times daily as needed for anxiety.   aspirin 325 MG EC tablet Take 1 tablet (325 mg total) by mouth daily.   diltiazem 120 MG 12 hr capsule Commonly known as:  CARDIZEM SR Take 1 capsule (120 mg total) by mouth every 12 (twelve) hours.   FISH OIL PO Take 1 tablet by mouth 2 (two) times daily.   furosemide 40 MG tablet Commonly known as:  LASIX Take 1 tablet (40 mg total) by mouth daily.   HYDROcodone-acetaminophen 5-325 MG tablet Commonly known as:  NORCO/VICODIN Take 1 tablet by mouth every 4 (four) hours as needed for moderate pain.   metoprolol 50 MG tablet Commonly known as:  LOPRESSOR Take 1 tablet (50 mg total) by mouth 2 (two) times daily.   mometasone 50 MCG/ACT nasal spray Commonly known as:  NASONEX Place 2 sprays into the nose daily as needed (for congestion).   multivitamin tablet Take 1 tablet by mouth daily.   polyethylene glycol powder powder Commonly known as:  GLYCOLAX/MIRALAX Take 17 g by mouth 2 (two) times daily as needed.   potassium chloride SA  20 MEQ tablet Commonly known as:  K-DUR,KLOR-CON Take 1 tablet (20 mEq total) by mouth daily.   predniSONE 20 MG tablet Commonly known as:  DELTASONE Take 1 tablet (20 mg total) by mouth daily with breakfast.   sertraline 50 MG tablet Commonly known as:  ZOLOFT Take 1 tablet by mouth  daily   simvastatin 80 MG tablet Commonly known as:  ZOCOR Take 1 tablet by mouth at  bedtime       Review of Systems  Immunization History  Administered Date(s) Administered  . Influenza Whole 09/23/2010  . Influenza, High Dose Seasonal PF 07/07/2015  . PPD Test 08/10/2016  . Pneumococcal Conjugate-13 07/07/2015  . Pneumococcal Polysaccharide-23 09/23/2010    Pertinent  Health Maintenance Due  Topic Date Due  . DEXA SCAN  07/13/1997  . MAMMOGRAM  02/25/2015  . INFLUENZA VACCINE  06/07/2016  . PNA vac Low Risk Adult  Completed   Fall Risk  07/04/2016 04/17/2015 02/24/2014 02/04/2013  Falls in the past year? Yes Yes No No  Number falls in past yr: 1 2 or more - -  Injury with Fall? No No - -  Risk for fall due to : - Impaired balance/gait - -   Functional Status Survey:    Vitals:   08/15/16 1320  BP: 130/70  Pulse: 82  Resp: 18  Temp: 97.9 F (36.6 C)  TempSrc: Oral  SpO2: 93%   There is no height or weight on file to calculate BMI. Physical Exam  Labs reviewed:  Recent Labs  08/01/16 0418  08/03/16 0401  08/08/16 0428 08/09/16 08/09/16 0328 08/10/16 08/10/16 0523  NA 139  < > 138  < > 138 138 138 137 137  K 4.0  < > 4.1  < > 3.8 4.6 4.6  --  4.5  CL 106  < > 101  < > 97*  --  98*  --  98*  CO2 26  < > 30  < > 35*  --  35*  --  32  GLUCOSE 99  < > 100*  < > 132*  --  142*  --  129*  BUN 24*  < > 21*  < > 32* 30* 30* 36* 36*  CREATININE 0.78  < > 0.78  < > 0.68 0.8 0.76 0.8 0.79  CALCIUM 9.0  < > 8.5*  < > 8.5*  --  8.8*  --  8.5*  MG 2.1  --  2.0  --   --   --   --   --   --   < > = values in this interval not displayed.  Recent Labs  10/22/15 1233 02/11/16 1230 07/04/16 1700  AST 14 13 14   ALT 10 9 9   ALKPHOS 52 52 41  BILITOT 0.3 0.4 0.3  PROT 6.7 6.6 7.0  ALBUMIN 3.5 3.5 3.7    Recent Labs  02/11/16 1230 07/04/16 1700  08/01/16 0021  08/05/16 0507 08/06/16 08/06/16 0508 08/07/16 08/07/16 0511  WBC 5.0 5.4  < > 6.6  < > 4.9 12.0 12.0* 13.0 13.0*  NEUTROABS 3.1 3.2  --  4.9  --   --   --   --   --   --   HGB 12.8 13.4  < > 12.1  < > 11.8* 12.7 12.7  --  12.4  HCT 38.0 39.7  < > 36.9  < > 36.5 36 35.8*  --  38.3  MCV 96.4 97.7  --  98.7  < >  99.2  --  95.2  --  98.2  PLT 165.0 151.0  < > 168  < > 242 287 287  --  313  < > = values in this interval not displayed. Lab Results  Component Value Date    TSH 1.812 08/01/2016   Lab Results  Component Value Date   HGBA1C 5.2 07/04/2016   Lab Results  Component Value Date   CHOL 162 08/01/2016   HDL 38 (L) 08/01/2016   LDLCALC 110 (H) 08/01/2016   TRIG 69 08/01/2016   CHOLHDL 4.3 08/01/2016    Significant Diagnostic Results in last 30 days:  Dg Chest Port 1 View  Result Date: 08/04/2016 CLINICAL DATA:  F/u pneumonia; sob and wheezing EXAM: PORTABLE CHEST 1 VIEW COMPARISON:  08/01/2016 FINDINGS: Exam is lordotic. Extremely low lung volumes. There is increased perihilar vascular congestion. New perihilar airspace disease. No pneumothorax IMPRESSION: New perihilar airspace disease suggests pulmonary edema. Low lung volumes the Electronically Signed   By: Suzy Bouchard M.D.   On: 08/04/2016 08:28   Dg Chest Portable 1 View  Result Date: 08/01/2016 CLINICAL DATA:  Generalized weakness for 2 weeks. Fell at home tonight. Leg pain. History of hypertension, hyperlipidemia. EXAM: PORTABLE CHEST 1 VIEW COMPARISON:  Chest radiograph November 10, 2014 FINDINGS: Cardiac silhouette is mildly enlarged, even with CAD considerations low inspiratory examination. Pulmonary vascular congestion interstitial prominence. Small bilateral pleural effusions. No pneumothorax. Severe degenerative change of the shoulders. IMPRESSION: Mild cardiomegaly.  Interstitial edema with small pleural effusions. Electronically Signed   By: Elon Alas M.D.   On: 08/01/2016 01:51   Dg Foot Complete Right  Result Date: 08/01/2016 CLINICAL DATA:  Acute onset of right foot pain, especially at the great toe, after fall. Initial encounter. EXAM: RIGHT FOOT COMPLETE - 3+ VIEW COMPARISON:  None. FINDINGS: There is a comminuted fracture involving the first proximal phalanx, with 2 fracture lines extending to the first metatarsophalangeal joint, and mild impaction of a central fragment. No additional fractures are seen. There is no evidence of talar subluxation; the subtalar joint is  unremarkable in appearance. A plantar calcaneal spur is noted. Diffuse dorsal soft tissue swelling is noted at the foot, with scattered soft tissue calcifications. IMPRESSION: 1. Comminuted fracture of the first proximal phalanx, with 2 fracture lines extending to the first metatarsophalangeal joint, and mild impaction of a central fragment. 2. Diffuse dorsal soft tissue swelling, with scattered soft tissue calcifications. Electronically Signed   By: Garald Balding M.D.   On: 08/01/2016 01:01   Dg Femur Min 2 Views Left  Result Date: 08/01/2016 CLINICAL DATA:  82 year old female with bilateral leg pain and fall. EXAM: LEFT FEMUR 2 VIEWS COMPARISON:  Right femur radiograph dated 07/31/2016 FINDINGS: There is no acute fracture or dislocation. The bones are osteopenic. There is a total left knee arthroplasty. There are osteoarthritic changes of the left hip. There is spurring of the superior patella along the insertion of the quadriceps tendon. No significant joint effusion. The soft tissues are grossly unremarkable. IMPRESSION: No acute fracture or dislocation. Electronically Signed   By: Anner Crete M.D.   On: 08/01/2016 00:59   Dg Femur, Min 2 Views Right  Result Date: 08/01/2016 CLINICAL DATA:  Status post fall, with bilateral femur pain. Initial encounter. EXAM: RIGHT FEMUR 2 VIEWS COMPARISON:  None. FINDINGS: There is no evidence of fracture or dislocation. The right femur appears grossly intact. The right hip joint is grossly unremarkable. The patient's right knee arthroplasty is grossly unremarkable  in appearance, without evidence of loosening. No definite soft tissue abnormalities are characterized on radiograph. No knee joint effusion is seen. IMPRESSION: No evidence of fracture or dislocation. Right knee arthroplasty is unremarkable in appearance. Electronically Signed   By: Garald Balding M.D.   On: 08/01/2016 01:03    Assessment/Plan There are no diagnoses linked to this  encounter.   Family/ staff Communication: Labs/tests ordered:

## 2016-08-15 NOTE — ED Provider Notes (Signed)
[facility] DEPT Provider Note   CSN: [id number] Arrival date & time: 08/15/16  2218  By signing my name below, I, Irene Pap, attest that this documentation has been prepared under the direction and in the presence of [employer], PA-C. Electronically Signed: Irene Pap, ED Scribe. 08/15/16. 11:47 PM.  History   Chief Complaint Chief Complaint  Patient presents with  . Cellulitis   The history is provided by the patient and a relative. No language interpreter was used.   HPI Comments: Norma Ford is a 82 y.o. female with a hx of HTN, CHF, A-Fib and cellulitis brought in by EMS from [facility] who presents to the Emergency Department complaining of bilateral leg swelling and a skin infection to the RLE onset 4 days ago. Pt was seen two weeks ago s/p fall and subsequently admitted for A-Fib. She was discharged on 08/10/16. Pt had 11 L of fluid drained off of her with Lasix. She was then brought to [facility] and developed thrush and a fever tmax 101.6 F. Pt has been on 2L O2 since being discharged. Pt says that her leg swelling and worsening redness began on Friday. The redness has spread up her leg over the weekend. She notes associated SOB over the past few days. Family says that pt has been very hot and experiencing increased thirst. Family was concerned because pt has been at risk of having the right leg amputated in the past due to infections. Pt is not on blood thinners because she is a high fall risk. Daughter says that pt is regularly weighed at [facility], but is unable to stand up on her own. They used a hoist to weight the pt, which caused her pain. Pt denies nausea and vomiting. Pt takes Lasix 1x a day.   Past Medical History:  Diagnosis Date  . Adrenal tumor 2002   right, followed by Surgery  . Barrett's esophagus 1999  . Cataracts, bilateral   . DECUBITUS ULCER, BUTTOCK 12/21/2009  . History of abnormal mammogram    2001-h/o abnormal mammo-told just  calcium deposit-repeat  . HTN (hypertension)   . Hyperlipidemia   . Kidney stone   . MYCOSIS FUNGOIDES LYMPH NODES MULTIPLE SITES 09/21/2009  . THROMBOCYTOPENIA 12/21/2009    Patient Active Problem List   Diagnosis Date Noted  . Cellulitis 08/16/2016  . HCAP (healthcare-associated pneumonia) 08/12/2016  . Acute respiratory failure with hypoxia ([place]) 08/12/2016  . Acute on chronic diastolic heart failure ([place]) 08/12/2016  . Closed fracture of phalanx of toe of right foot, initial encounter   . Acute diastolic CHF 0000000  . Atrial fibrillation with rapid ventricular response ([place]) 08/01/2016  . Fall 08/01/2016  . Thigh pain 08/01/2016  . Toe fracture, right 08/01/2016  . Atrial fibrillation with RVR ([place]) 08/01/2016  . Depression 08/01/2016  . Right leg pain 07/01/2016  . Frequent falls 02/14/2016  . History of fall 11/14/2015  . Osteoarthritis of both knees 05/15/2015  . Morbid obesity ([place]) 02/24/2014  . Cellulitis and abscess of lower leg 05/21/2012  . Calf pain 05/21/2012  . Edema 05/21/2012  . Chronic bronchitis 11/03/2011  . Hypoxia 10/20/2011  . THROMBOCYTOPENIA 12/21/2009  . DECUBITUS ULCER, BUTTOCK 12/21/2009  . MYCOSIS FUNGOIDES LYMPH NODES MULTIPLE SITES 09/21/2009  . DEGENERATIVE JOINT DISEASE, KNEE 09/21/2009  . SHOULDER PAIN, RIGHT 09/21/2009  . FEMALE STRESS INCONTINENCE 01/16/2009  . HLD (hyperlipidemia) 02/12/2008  . Hypertensive heart disease with CHF ([place]) 02/12/2008    Past Surgical History:  Procedure  Laterality Date  . ABDOMINAL HYSTERECTOMY    . appendectomy    . ESOPHAGOGASTRODUODENOSCOPY  03/2002  . TONSILLECTOMY    . TOTAL KNEE ARTHROPLASTY     left 2009, right 2003  . tubal ligation      OB History    No data available       Home Medications    Prior to Admission medications   Medication Sig Start Date End Date Taking? Authorizing Provider  ALPRAZolam (XANAX) 0.25 MG tablet Take 1 tablet (0.25 mg total) by mouth 2 (two) times  daily as needed for anxiety. 08/10/16   Mir Marry Guan, MD  aspirin EC 325 MG EC tablet Take 1 tablet (325 mg total) by mouth daily. 08/10/16   Mir Marry Guan, MD  diltiazem (CARDIZEM SR) 120 MG 12 hr capsule Take 1 capsule (120 mg total) by mouth every 12 (twelve) hours. 08/10/16   Mir Marry Guan, MD  fluconazole (DIFLUCAN) 100 MG tablet Take 100 mg by mouth daily. 08/12/16 08/26/16  Historical Provider, MD  furosemide (LASIX) 40 MG tablet Take 1 tablet (40 mg total) by mouth daily. 08/10/16   Mir Marry Guan, MD  HYDROcodone-acetaminophen (NORCO/VICODIN) 5-325 MG tablet Take 1 tablet by mouth every 4 (four) hours as needed for moderate pain. 08/10/16   Mir Marry Guan, MD  metoprolol (LOPRESSOR) 50 MG tablet Take 1 tablet (50 mg total) by mouth 2 (two) times daily. 08/10/16   Mir Marry Guan, MD  mometasone (NASONEX) 50 MCG/ACT nasal spray Place 2 sprays into the nose daily as needed (for congestion).     Historical Provider, MD  Multiple Vitamin (MULTIVITAMIN) tablet Take 1 tablet by mouth daily.      Historical Provider, MD  Omega-3 Fatty Acids (FISH OIL PO) Take 1 tablet by mouth 2 (two) times daily.    Historical Provider, MD  polyethylene glycol powder (GLYCOLAX/MIRALAX) powder Take 17 g by mouth 2 (two) times daily as needed. 07/24/15   Rosalita Chessman Chase, DO  potassium chloride SA (K-DUR,KLOR-CON) 20 MEQ tablet Take 1 tablet (20 mEq total) by mouth daily. 08/10/16   Mir Marry Guan, MD  predniSONE (DELTASONE) 20 MG tablet Take 1 tablet (20 mg total) by mouth daily with breakfast. 08/11/16 08/16/16  Mir Marry Guan, MD  sertraline (ZOLOFT) 50 MG tablet Take 1 tablet by mouth  daily 04/22/16   Rosalita Chessman Chase, DO  simvastatin (ZOCOR) 80 MG tablet Take 1 tablet by mouth at  bedtime 08/03/15   Ann Held, DO    Family History Family History  Problem Relation Age of Onset  . Heart disease Mother   . Heart disease Father      pacemaker  . Kidney disease Father     Social History Social History  Substance Use Topics  . Smoking status: Never Smoker  . Smokeless tobacco: Never Used  . Alcohol use No     Allergies   Codeine  Review of Systems Review of Systems  Constitutional: Positive for fever.  Respiratory: Positive for shortness of breath.   Cardiovascular: Positive for leg swelling.  Gastrointestinal: Negative for nausea and vomiting.  Skin: Positive for color change.  All other systems reviewed and are negative.    Physical Exam Updated Vital Signs BP 123/62 (BP Location: Left Arm)   Pulse 96   Temp 98.8 F (37.1 C) (Oral)   Resp 18   SpO2 97%   Physical Exam  Constitutional: She appears well-developed and well-nourished. No distress.  Awake, alert, nontoxic appearance Obese  HENT:  Head: Normocephalic and atraumatic.  Mouth/Throat: Oropharynx is clear and moist. No oropharyngeal exudate.  Eyes: Conjunctivae are normal. No scleral icterus.  Neck: Normal range of motion. Neck supple.  Cardiovascular: Normal rate and intact distal pulses.  An irregularly irregular rhythm present.  Pulses:      Radial pulses are 2+ on the right side, and 2+ on the left side.       Dorsalis pedis pulses are 2+ on the right side, and 2+ on the left side.  Pulmonary/Chest: Effort normal and breath sounds normal. No respiratory distress. She has no wheezes.  Equal chest expansion  Abdominal: Soft. Bowel sounds are normal. She exhibits no mass. There is no tenderness. There is no rebound and no guarding.  Musculoskeletal: Normal range of motion. She exhibits edema.  Edema bilaterally but right leg with more peripheral edema than left.  Neurological: She is alert.  Speech is clear and goal oriented Moves extremities without ataxia  Skin: Skin is warm and dry. She is not diaphoretic.  Right lower extremity with a large area of erythema, increased warmth and induration on the anterior and lateral  aspect which extends to the posterior aspect and upward to the mid thigh.  Admitting and tenderness throughout the area without crepitus or subcutaneous air. No gross abscess.  Psychiatric: She has a normal mood and affect.  Nursing note and vitals reviewed.  ED Treatments / Results  DIAGNOSTIC STUDIES: Oxygen Saturation is 97% on 2L O2, normal by my interpretation.    COORDINATION OF CARE: 11:40 PM-Discussed treatment plan which includes labs and [place] with pt at bedside and pt agreed to plan.    Labs (all labs ordered are listed, but only abnormal results are displayed) Labs Reviewed  CBC WITH DIFFERENTIAL/PLATELET - Abnormal; Notable for the following:       Result Value   WBC 35.3 (*)    RBC 3.66 (*)    Hemoglobin 11.8 (*)    HCT 35.2 (*)    Platelets 107 (*)    Neutro Abs 32.4 (*)    Monocytes Absolute 1.8 (*)    All other components within normal limits  COMPREHENSIVE METABOLIC PANEL - Abnormal; Notable for the following:    Glucose, Bld 121 (*)    BUN 25 (*)    Calcium 8.6 (*)    Total Protein 5.8 (*)    Albumin 2.6 (*)    ALT 65 (*)    All other components within normal limits  BRAIN NATRIURETIC PEPTIDE - Abnormal; Notable for the following:    B Natriuretic Peptide 419.6 (*)    All other components within normal limits  CULTURE, BLOOD (ROUTINE X 2)  CULTURE, BLOOD (ROUTINE X 2)  URINE CULTURE  URINALYSIS, ROUTINE W REFLEX MICROSCOPIC (NOT AT [facility])  I-STAT CG4 LACTIC ACID, ED  I-STAT TROPOININ, ED  I-STAT CG4 LACTIC ACID, ED    EKG  EKG Interpretation  Date/Time:  Tuesday August 16 2016 00:12:46 EDT Ventricular Rate:  98 PR Interval:    QRS Duration: 104 QT Interval:  333 QTC Calculation: 426 R Axis:   -27 Text Interpretation:  Atrial fibrillation Probable left ventricular hypertrophy When compared with ECG of 08/09/2016, No significant change was found Confirmed by [facility]  MD, DAVID ([id number]) on 08/16/2016 12:19:54 AM       Radiology Dg Chest Port 1  View  Result Date: 08/16/2016 CLINICAL DATA:  Shortness of breath for days, increased. EXAM: PORTABLE CHEST  1 VIEW COMPARISON:  Radiographs 08/04/2016 FINDINGS: Low lung volumes persist. Improved pulmonary edema from prior. Left lung base and midlung zone atelectasis. Probable small left pleural effusion. Cardiomegaly and mediastinal contours are stable. Chronic change about both shoulders. IMPRESSION: Improved pulmonary edema. Left lung atelectasis. Probable left pleural effusion, similar to prior. Electronically Signed   By: Jeb Levering M.D.   On: 08/16/2016 01:22   Dg Knee Right Port  Result Date: 08/16/2016 CLINICAL DATA:  Chronic right leg swelling, with acute onset of erythema and pain. EXAM: PORTABLE RIGHT KNEE - 1-2 VIEW COMPARISON:  None. FINDINGS: There appears to be loosening of the tibial component of the patient's total knee arthroplasty, with lateral and anterior tilt of the component. The femoral component is grossly unremarkable in appearance. There is no evidence of fracture. No knee joint effusion is seen. Diffuse soft tissue swelling is noted about the knee. IMPRESSION: 1. Apparent loosening of the tibial component of the total knee arthroplasty, with lateral and anterior tilt of the component. Would correlate clinically. 2. Diffuse soft tissue swelling about the knee. Electronically Signed   By: Garald Balding M.D.   On: 08/16/2016 01:28    Procedures Procedures (including critical care time)  Medications Ordered in ED Medications  0.9 %  sodium chloride infusion (1,000 mLs Intravenous New Bag/Given 08/16/16 0057)  piperacillin-tazobactam (ZOSYN) IVPB 3.375 g (not administered)  piperacillin-tazobactam (ZOSYN) IVPB 3.375 g (0 g Intravenous Stopped 08/16/16 0139)  vancomycin (VANCOCIN) IVPB 1000 mg/200 mL premix (1,000 mg Intravenous New Bag/Given 08/16/16 0140)     Initial Impression / Assessment and Plan / ED Course  I have reviewed the triage vital signs and the  nursing notes.  Pertinent labs & imaging results that were available during my care of the patient were reviewed by me and considered in my medical decision making (see chart for details).  Clinical Course  Value Comment By Time   Discussed with Dr. Loleta Books who will admit to med-surg obs.   Justina Bertini, PA-C 10/10 0244  WBC: (!) 35.3 Significant leukocytosis. Damani Kelemen, PA-C 10/10 0244  B Natriuretic Peptide: (!) 419.6 Elevated BNP Tessia Kassin, PA-C 10/10 0244  Hemoglobin: (!) 11.8 Mild anemia. Not far from baseline. Abdou Stocks, PA-C 10/10 0245  Lactic Acid, Venous: 0.99 within normal limits Kyrsten Deleeuw, PA-C 10/10 0245  BP: 120/60 Vital signs stable without fever or tachycardia here in the emergency department. No hypotension. Meggie Laseter, PA-C 10/10 0245  DG Knee Right Port No free air noted. Doubt necrotizing fasciitis Abigail Butts, PA-C 10/10 0245  DG Chest Port 1 View No evidence of pneumonia Viviann Broyles, PA-C 10/10 0245  EKG 12-Lead Rate controlled A. fib Adaia Matthies, PA-C 10/10 0246    Patient with significant cellulitis. No physical or clinical evidence of necrotizing fasciitis. Blood cultures obtained. Patient started on sepsis protocol with fluids and antibiotics. Weightbase fluids were not given due to patient's previous history of pulmonary edema. She has been without hypotension. In no elevation in lactic acid. No evidence of septic shock.  Final Clinical Impressions(s) / ED Diagnoses   Final diagnoses:  Cellulitis of right lower extremity  Chronic atrial fibrillation ([place])   I personally performed the services described in this documentation, which was scribed in my presence. The recorded information has been reviewed and is accurate.  New Prescriptions New Prescriptions   No medications on file     Abigail Butts, PA-C [id number]    Delora Fuel, MD [id number]

## 2016-08-15 NOTE — Telephone Encounter (Signed)
Please advise in Dr.Lowne's absence.   KP 

## 2016-08-16 ENCOUNTER — Other Ambulatory Visit: Payer: Self-pay | Admitting: *Deleted

## 2016-08-16 ENCOUNTER — Emergency Department (HOSPITAL_COMMUNITY): Payer: Medicare Other

## 2016-08-16 ENCOUNTER — Inpatient Hospital Stay (HOSPITAL_COMMUNITY): Payer: Medicare Other

## 2016-08-16 ENCOUNTER — Encounter (HOSPITAL_COMMUNITY): Payer: Self-pay | Admitting: Family Medicine

## 2016-08-16 DIAGNOSIS — Z6841 Body Mass Index (BMI) 40.0 and over, adult: Secondary | ICD-10-CM | POA: Diagnosis not present

## 2016-08-16 DIAGNOSIS — I5033 Acute on chronic diastolic (congestive) heart failure: Secondary | ICD-10-CM | POA: Diagnosis not present

## 2016-08-16 DIAGNOSIS — M25561 Pain in right knee: Secondary | ICD-10-CM | POA: Diagnosis not present

## 2016-08-16 DIAGNOSIS — Z66 Do not resuscitate: Secondary | ICD-10-CM | POA: Diagnosis present

## 2016-08-16 DIAGNOSIS — I87333 Chronic venous hypertension (idiopathic) with ulcer and inflammation of bilateral lower extremity: Secondary | ICD-10-CM | POA: Diagnosis not present

## 2016-08-16 DIAGNOSIS — Z79899 Other long term (current) drug therapy: Secondary | ICD-10-CM | POA: Diagnosis not present

## 2016-08-16 DIAGNOSIS — Z96653 Presence of artificial knee joint, bilateral: Secondary | ICD-10-CM | POA: Diagnosis present

## 2016-08-16 DIAGNOSIS — L03115 Cellulitis of right lower limb: Secondary | ICD-10-CM

## 2016-08-16 DIAGNOSIS — Z7189 Other specified counseling: Secondary | ICD-10-CM | POA: Diagnosis not present

## 2016-08-16 DIAGNOSIS — Z841 Family history of disorders of kidney and ureter: Secondary | ICD-10-CM | POA: Diagnosis not present

## 2016-08-16 DIAGNOSIS — M79661 Pain in right lower leg: Secondary | ICD-10-CM | POA: Diagnosis present

## 2016-08-16 DIAGNOSIS — R296 Repeated falls: Secondary | ICD-10-CM | POA: Diagnosis present

## 2016-08-16 DIAGNOSIS — R531 Weakness: Secondary | ICD-10-CM | POA: Diagnosis not present

## 2016-08-16 DIAGNOSIS — B9689 Other specified bacterial agents as the cause of diseases classified elsewhere: Secondary | ICD-10-CM | POA: Diagnosis not present

## 2016-08-16 DIAGNOSIS — J42 Unspecified chronic bronchitis: Secondary | ICD-10-CM | POA: Diagnosis not present

## 2016-08-16 DIAGNOSIS — Z7982 Long term (current) use of aspirin: Secondary | ICD-10-CM | POA: Diagnosis not present

## 2016-08-16 DIAGNOSIS — I480 Paroxysmal atrial fibrillation: Secondary | ICD-10-CM | POA: Diagnosis present

## 2016-08-16 DIAGNOSIS — I482 Chronic atrial fibrillation, unspecified: Secondary | ICD-10-CM

## 2016-08-16 DIAGNOSIS — D6959 Other secondary thrombocytopenia: Secondary | ICD-10-CM | POA: Diagnosis present

## 2016-08-16 DIAGNOSIS — K5903 Drug induced constipation: Secondary | ICD-10-CM | POA: Diagnosis present

## 2016-08-16 DIAGNOSIS — D696 Thrombocytopenia, unspecified: Secondary | ICD-10-CM

## 2016-08-16 DIAGNOSIS — M7989 Other specified soft tissue disorders: Secondary | ICD-10-CM | POA: Diagnosis not present

## 2016-08-16 DIAGNOSIS — M810 Age-related osteoporosis without current pathological fracture: Secondary | ICD-10-CM | POA: Diagnosis present

## 2016-08-16 DIAGNOSIS — Z8249 Family history of ischemic heart disease and other diseases of the circulatory system: Secondary | ICD-10-CM | POA: Diagnosis not present

## 2016-08-16 DIAGNOSIS — B370 Candidal stomatitis: Secondary | ICD-10-CM | POA: Diagnosis present

## 2016-08-16 DIAGNOSIS — T402X5A Adverse effect of other opioids, initial encounter: Secondary | ICD-10-CM | POA: Diagnosis present

## 2016-08-16 DIAGNOSIS — I110 Hypertensive heart disease with heart failure: Secondary | ICD-10-CM | POA: Diagnosis not present

## 2016-08-16 DIAGNOSIS — Z9981 Dependence on supplemental oxygen: Secondary | ICD-10-CM | POA: Diagnosis not present

## 2016-08-16 DIAGNOSIS — G8929 Other chronic pain: Secondary | ICD-10-CM | POA: Diagnosis not present

## 2016-08-16 DIAGNOSIS — M1710 Unilateral primary osteoarthritis, unspecified knee: Secondary | ICD-10-CM | POA: Diagnosis not present

## 2016-08-16 DIAGNOSIS — Z515 Encounter for palliative care: Secondary | ICD-10-CM | POA: Diagnosis not present

## 2016-08-16 DIAGNOSIS — I509 Heart failure, unspecified: Secondary | ICD-10-CM | POA: Diagnosis not present

## 2016-08-16 DIAGNOSIS — L0390 Cellulitis, unspecified: Secondary | ICD-10-CM | POA: Diagnosis present

## 2016-08-16 DIAGNOSIS — I5032 Chronic diastolic (congestive) heart failure: Secondary | ICD-10-CM | POA: Diagnosis not present

## 2016-08-16 DIAGNOSIS — E785 Hyperlipidemia, unspecified: Secondary | ICD-10-CM | POA: Diagnosis present

## 2016-08-16 DIAGNOSIS — R0602 Shortness of breath: Secondary | ICD-10-CM | POA: Diagnosis not present

## 2016-08-16 LAB — CBC WITH DIFFERENTIAL/PLATELET
BASOS PCT: 0 %
Basophils Absolute: 0 10*3/uL (ref 0.0–0.1)
EOS PCT: 0 %
Eosinophils Absolute: 0 10*3/uL (ref 0.0–0.7)
HEMATOCRIT: 35.2 % — AB (ref 36.0–46.0)
Hemoglobin: 11.8 g/dL — ABNORMAL LOW (ref 12.0–15.0)
LYMPHS ABS: 1.1 10*3/uL (ref 0.7–4.0)
Lymphocytes Relative: 3 %
MCH: 32.2 pg (ref 26.0–34.0)
MCHC: 33.5 g/dL (ref 30.0–36.0)
MCV: 96.2 fL (ref 78.0–100.0)
MONO ABS: 1.8 10*3/uL — AB (ref 0.1–1.0)
MONOS PCT: 5 %
NEUTROS ABS: 32.4 10*3/uL — AB (ref 1.7–7.7)
Neutrophils Relative %: 92 %
PLATELETS: 107 10*3/uL — AB (ref 150–400)
RBC: 3.66 MIL/uL — ABNORMAL LOW (ref 3.87–5.11)
RDW: 14.8 % (ref 11.5–15.5)
WBC: 35.3 10*3/uL — ABNORMAL HIGH (ref 4.0–10.5)

## 2016-08-16 LAB — CBC
HEMATOCRIT: 33.7 % — AB (ref 36.0–46.0)
Hemoglobin: 11.5 g/dL — ABNORMAL LOW (ref 12.0–15.0)
MCH: 32.1 pg (ref 26.0–34.0)
MCHC: 34.1 g/dL (ref 30.0–36.0)
MCV: 94.1 fL (ref 78.0–100.0)
PLATELETS: 91 10*3/uL — AB (ref 150–400)
RBC: 3.58 MIL/uL — ABNORMAL LOW (ref 3.87–5.11)
RDW: 14.4 % (ref 11.5–15.5)
WBC: 31.7 10*3/uL — AB (ref 4.0–10.5)

## 2016-08-16 LAB — BRAIN NATRIURETIC PEPTIDE: B NATRIURETIC PEPTIDE 5: 419.6 pg/mL — AB (ref 0.0–100.0)

## 2016-08-16 LAB — URINALYSIS, ROUTINE W REFLEX MICROSCOPIC
Bilirubin Urine: NEGATIVE
Glucose, UA: NEGATIVE mg/dL
Hgb urine dipstick: NEGATIVE
Ketones, ur: NEGATIVE mg/dL
NITRITE: NEGATIVE
PROTEIN: NEGATIVE mg/dL
Specific Gravity, Urine: 1.019 (ref 1.005–1.030)
pH: 6.5 (ref 5.0–8.0)

## 2016-08-16 LAB — I-STAT TROPONIN, ED: TROPONIN I, POC: 0.02 ng/mL (ref 0.00–0.08)

## 2016-08-16 LAB — URINE MICROSCOPIC-ADD ON: RBC / HPF: NONE SEEN RBC/hpf (ref 0–5)

## 2016-08-16 LAB — MRSA PCR SCREENING: MRSA by PCR: NEGATIVE

## 2016-08-16 MED ORDER — FUROSEMIDE 40 MG PO TABS
40.0000 mg | ORAL_TABLET | Freq: Every day | ORAL | Status: DC
  Administered 2016-08-17 – 2016-08-21 (×5): 40 mg via ORAL
  Filled 2016-08-16 (×6): qty 1

## 2016-08-16 MED ORDER — POTASSIUM CHLORIDE CRYS ER 20 MEQ PO TBCR: 40.0000 meq | EXTENDED_RELEASE_TABLET | Freq: Every day | ORAL | Status: DC

## 2016-08-16 MED ORDER — PREMIER PROTEIN SHAKE
11.0000 [oz_av] | Freq: Two times a day (BID) | ORAL | Status: DC
  Administered 2016-08-16 – 2016-08-26 (×16): 11 [oz_av] via ORAL
  Filled 2016-08-16 (×18): qty 325.31

## 2016-08-16 MED ORDER — FLUCONAZOLE 100 MG PO TABS
100.0000 mg | ORAL_TABLET | Freq: Every day | ORAL | Status: AC
  Administered 2016-08-16 – 2016-08-17 (×2): 100 mg via ORAL
  Filled 2016-08-16 (×2): qty 1

## 2016-08-16 MED ORDER — SENNOSIDES-DOCUSATE SODIUM 8.6-50 MG PO TABS: 1.0000 | ORAL_TABLET | Freq: Every evening | ORAL | Status: DC | PRN

## 2016-08-16 MED ORDER — ENSURE ENLIVE PO LIQD: 237.0000 mL | Freq: Two times a day (BID) | ORAL | Status: DC

## 2016-08-16 MED ORDER — DILTIAZEM HCL ER 60 MG PO CP12
120.0000 mg | ORAL_CAPSULE | Freq: Two times a day (BID) | ORAL | Status: DC
  Administered 2016-08-16 – 2016-08-24 (×18): 120 mg via ORAL
  Filled 2016-08-16 (×18): qty 2

## 2016-08-16 MED ORDER — ORAL CARE MOUTH RINSE
15.0000 mL | Freq: Two times a day (BID) | OROMUCOSAL | Status: DC
  Administered 2016-08-16 – 2016-08-26 (×20): 15 mL via OROMUCOSAL

## 2016-08-16 MED ORDER — ONDANSETRON HCL 4 MG/2ML IJ SOLN: 4.0000 mg | Freq: Four times a day (QID) | INTRAMUSCULAR | Status: DC | PRN

## 2016-08-16 MED ORDER — ADULT MULTIVITAMIN W/MINERALS CH
1.0000 | ORAL_TABLET | Freq: Every day | ORAL | Status: DC
  Administered 2016-08-16 – 2016-08-25 (×10): 1 via ORAL
  Filled 2016-08-16 (×9): qty 1

## 2016-08-16 MED ORDER — ONDANSETRON HCL 4 MG PO TABS: 4.0000 mg | ORAL_TABLET | Freq: Four times a day (QID) | ORAL | Status: DC | PRN

## 2016-08-16 MED ORDER — FUROSEMIDE 10 MG/ML IJ SOLN
40.0000 mg | Freq: Two times a day (BID) | INTRAMUSCULAR | Status: DC
  Administered 2016-08-16: 40 mg via INTRAVENOUS
  Filled 2016-08-16: qty 4

## 2016-08-16 MED ORDER — SACCHAROMYCES BOULARDII 250 MG PO CAPS
250.0000 mg | ORAL_CAPSULE | Freq: Two times a day (BID) | ORAL | Status: DC
  Administered 2016-08-16 – 2016-08-26 (×21): 250 mg via ORAL
  Filled 2016-08-16 (×22): qty 1

## 2016-08-16 MED ORDER — ALBUTEROL SULFATE (2.5 MG/3ML) 0.083% IN NEBU
2.5000 mg | INHALATION_SOLUTION | RESPIRATORY_TRACT | Status: DC | PRN
Start: 2016-08-16 — End: 2016-08-26

## 2016-08-16 MED ORDER — METOPROLOL TARTRATE 50 MG PO TABS
50.0000 mg | ORAL_TABLET | Freq: Two times a day (BID) | ORAL | Status: DC
  Administered 2016-08-16 – 2016-08-26 (×21): 50 mg via ORAL
  Filled 2016-08-16 (×21): qty 1

## 2016-08-16 MED ORDER — DEXTROSE 5 % IV SOLN
2.0000 g | INTRAVENOUS | Status: DC
  Administered 2016-08-16 – 2016-08-23 (×8): 2 g via INTRAVENOUS
  Filled 2016-08-16 (×8): qty 2

## 2016-08-16 MED ORDER — VANCOMYCIN HCL IN DEXTROSE 1-5 GM/200ML-% IV SOLN: 1000.0000 mg | INTRAVENOUS | Status: DC

## 2016-08-16 MED ORDER — HYDROCODONE-ACETAMINOPHEN 5-325 MG PO TABS: 1.0000 | ORAL_TABLET | ORAL | Status: DC | PRN

## 2016-08-16 MED ORDER — POLYETHYLENE GLYCOL 3350 17 G PO PACK: 17.0000 g | PACK | Freq: Two times a day (BID) | ORAL | Status: DC | PRN

## 2016-08-16 MED ORDER — ALPRAZOLAM 0.25 MG PO TABS
0.2500 mg | ORAL_TABLET | Freq: Two times a day (BID) | ORAL | Status: DC | PRN
  Administered 2016-08-20 – 2016-08-26 (×3): 0.25 mg via ORAL
  Filled 2016-08-16 (×4): qty 1

## 2016-08-16 MED ORDER — ACETAMINOPHEN 325 MG PO TABS: 650.0000 mg | ORAL_TABLET | Freq: Four times a day (QID) | ORAL | Status: DC | PRN

## 2016-08-16 MED ORDER — POTASSIUM CHLORIDE CRYS ER 20 MEQ PO TBCR
40.0000 meq | EXTENDED_RELEASE_TABLET | Freq: Two times a day (BID) | ORAL | Status: DC
  Administered 2016-08-16 – 2016-08-18 (×6): 40 meq via ORAL
  Filled 2016-08-16 (×6): qty 2

## 2016-08-16 MED ORDER — ENOXAPARIN SODIUM 60 MG/0.6ML ~~LOC~~ SOLN
60.0000 mg | Freq: Every day | SUBCUTANEOUS | Status: DC
  Administered 2016-08-16 – 2016-08-26 (×11): 60 mg via SUBCUTANEOUS
  Filled 2016-08-16 (×11): qty 0.6

## 2016-08-16 MED ORDER — PIPERACILLIN-TAZOBACTAM 3.375 G IVPB: 3.3750 g | Freq: Three times a day (TID) | INTRAVENOUS | Status: DC

## 2016-08-16 MED ORDER — ATORVASTATIN CALCIUM 40 MG PO TABS
40.0000 mg | ORAL_TABLET | Freq: Every day | ORAL | Status: DC
  Administered 2016-08-16 – 2016-08-24 (×8): 40 mg via ORAL
  Filled 2016-08-16 (×9): qty 1

## 2016-08-16 MED ORDER — ASPIRIN EC 325 MG PO TBEC
325.0000 mg | DELAYED_RELEASE_TABLET | Freq: Every day | ORAL | Status: DC
  Administered 2016-08-16 – 2016-08-26 (×11): 325 mg via ORAL
  Filled 2016-08-16 (×11): qty 1

## 2016-08-16 MED ORDER — PREDNISONE 20 MG PO TABS
20.0000 mg | ORAL_TABLET | Freq: Every day | ORAL | Status: AC
  Administered 2016-08-16: 20 mg via ORAL
  Filled 2016-08-16: qty 1

## 2016-08-16 MED ORDER — POLYETHYLENE GLYCOL 3350 17 GM/SCOOP PO POWD
17.0000 g | Freq: Two times a day (BID) | ORAL | Status: DC | PRN
  Filled 2016-08-16: qty 255

## 2016-08-16 MED ORDER — HYDROCODONE-ACETAMINOPHEN 5-325 MG PO TABS
1.0000 | ORAL_TABLET | ORAL | Status: DC | PRN
  Administered 2016-08-16 – 2016-08-17 (×5): 2 via ORAL
  Administered 2016-08-18 (×3): 1 via ORAL
  Administered 2016-08-18: 2 via ORAL
  Administered 2016-08-19: 1 via ORAL
  Administered 2016-08-20: 2 via ORAL
  Filled 2016-08-16: qty 2
  Filled 2016-08-16: qty 1
  Filled 2016-08-16 (×3): qty 2
  Filled 2016-08-16: qty 1
  Filled 2016-08-16: qty 2
  Filled 2016-08-16: qty 1
  Filled 2016-08-16 (×2): qty 2
  Filled 2016-08-16: qty 1

## 2016-08-16 MED ORDER — ENOXAPARIN SODIUM 40 MG/0.4ML ~~LOC~~ SOLN: 40.0000 mg | SUBCUTANEOUS | Status: DC

## 2016-08-16 MED ORDER — SERTRALINE HCL 50 MG PO TABS
50.0000 mg | ORAL_TABLET | Freq: Every day | ORAL | Status: DC
  Administered 2016-08-16 – 2016-08-26 (×11): 50 mg via ORAL
  Filled 2016-08-16 (×11): qty 1

## 2016-08-16 MED ORDER — ACETAMINOPHEN 650 MG RE SUPP: 650.0000 mg | Freq: Four times a day (QID) | RECTAL | Status: DC | PRN

## 2016-08-16 NOTE — Consult Note (Signed)
ORTHOPAEDIC CONSULTATION  REQUESTING PHYSICIAN: Louellen Molder, MD  Chief Complaint: Cellulitis right leg unable to ambulate due to right leg weakness  HPI: Norma Ford is a 82 y.o. female who presents with cellulitis with venous stasis insufficiency right lower extremity. Patient has been unable to ambulate independently due to right leg weakness and pain. Patient does have a motorized wheelchair but she states she is afraid to use it because it goes too fast.  Past Medical History:  Diagnosis Date  . Adrenal tumor 2002   right, followed by Surgery  . Barrett's esophagus 1999  . Cataracts, bilateral   . DECUBITUS ULCER, BUTTOCK 12/21/2009  . History of abnormal mammogram    2001-h/o abnormal mammo-told just calcium deposit-repeat  . HTN (hypertension)   . Hyperlipidemia   . Kidney stone   . MYCOSIS FUNGOIDES LYMPH NODES MULTIPLE SITES 09/21/2009  . THROMBOCYTOPENIA 12/21/2009   Past Surgical History:  Procedure Laterality Date  . ABDOMINAL HYSTERECTOMY    . appendectomy    . ESOPHAGOGASTRODUODENOSCOPY  03/2002  . TONSILLECTOMY    . TOTAL KNEE ARTHROPLASTY     left 2009, right 2003  . tubal ligation     Social History   Social History  . Marital status: Married    Spouse name: N/A  . Number of children: N/A  . Years of education: N/A   Social History Main Topics  . Smoking status: Never Smoker  . Smokeless tobacco: Never Used  . Alcohol use No  . Drug use: No  . Sexual activity: No   Other Topics Concern  . None   Social History Narrative  . None   Family History  Problem Relation Age of Onset  . Heart disease Mother   . Heart disease Father     pacemaker  . Kidney disease Father    - negative except otherwise stated in the family history section Allergies  Allergen Reactions  . Codeine     REACTION: ITCHING   Prior to Admission medications   Medication Sig Start Date End Date Taking? Authorizing Provider  ALPRAZolam (XANAX) 0.25 MG tablet  Take 1 tablet (0.25 mg total) by mouth 2 (two) times daily as needed for anxiety. 08/10/16  Yes Mir Marry Guan, MD  aspirin EC 325 MG EC tablet Take 1 tablet (325 mg total) by mouth daily. 08/10/16  Yes Mir Marry Guan, MD  diltiazem (CARDIZEM SR) 120 MG 12 hr capsule Take 1 capsule (120 mg total) by mouth every 12 (twelve) hours. 08/10/16  Yes Mir Marry Guan, MD  fluconazole (DIFLUCAN) 100 MG tablet Take 100 mg by mouth daily. 08/12/16 08/26/16 Yes Historical Provider, MD  furosemide (LASIX) 40 MG tablet Take 1 tablet (40 mg total) by mouth daily. Patient taking differently: Take 80 mg by mouth daily.  08/10/16  Yes Mir Marry Guan, MD  HYDROcodone-acetaminophen (NORCO/VICODIN) 5-325 MG tablet Take 1 tablet by mouth every 4 (four) hours as needed for moderate pain. 08/10/16  Yes Mir Marry Guan, MD  metoprolol (LOPRESSOR) 50 MG tablet Take 1 tablet (50 mg total) by mouth 2 (two) times daily. 08/10/16  Yes Mir Marry Guan, MD  mometasone (NASONEX) 50 MCG/ACT nasal spray Place 2 sprays into the nose daily as needed (for congestion).    Yes Historical Provider, MD  Multiple Vitamin (MULTIVITAMIN) tablet Take 1 tablet by mouth daily.     Yes Historical Provider, MD  Omega-3 Fatty Acids (FISH OIL PO) Take 1,000 mg by mouth 2 (two) times daily.  Yes Historical Provider, MD  polyethylene glycol powder (GLYCOLAX/MIRALAX) powder Take 17 g by mouth 2 (two) times daily as needed. Patient taking differently: Take 17 g by mouth 2 (two) times daily as needed for mild constipation or moderate constipation.  07/24/15  Yes Yvonne R Lowne Chase, DO  potassium chloride SA (K-DUR,KLOR-CON) 20 MEQ tablet Take 1 tablet (20 mEq total) by mouth daily. Patient taking differently: Take 40 mEq by mouth daily.  08/10/16  Yes Mir Marry Guan, MD  predniSONE (DELTASONE) 20 MG tablet Take 1 tablet (20 mg total) by mouth daily with breakfast. 08/11/16 08/16/16 Yes Mir Marry Guan, MD  saccharomyces boulardii (FLORASTOR) 250 MG capsule Take 250 mg by mouth 2 (two) times daily.   Yes Historical Provider, MD  sertraline (ZOLOFT) 50 MG tablet Take 1 tablet by mouth  daily Patient taking differently: Take 50 mg by mouth daily 04/22/16  Yes Alferd Apa Lowne Chase, DO  simvastatin (ZOCOR) 80 MG tablet Take 1 tablet by mouth at  bedtime Patient taking differently: Take 80 mg by mouth at bedtime 08/03/15  Yes Ann Held, DO   Dg Chest Port 1 View  Result Date: 08/16/2016 CLINICAL DATA:  Shortness of breath for days, increased. EXAM: PORTABLE CHEST 1 VIEW COMPARISON:  Radiographs 08/04/2016 FINDINGS: Low lung volumes persist. Improved pulmonary edema from prior. Left lung base and midlung zone atelectasis. Probable small left pleural effusion. Cardiomegaly and mediastinal contours are stable. Chronic change about both shoulders. IMPRESSION: Improved pulmonary edema. Left lung atelectasis. Probable left pleural effusion, similar to prior. Electronically Signed   By: Jeb Levering M.D.   On: 08/16/2016 01:22   Dg Knee Right Port  Result Date: 08/16/2016 CLINICAL DATA:  Chronic right leg swelling, with acute onset of erythema and pain. EXAM: PORTABLE RIGHT KNEE - 1-2 VIEW COMPARISON:  None. FINDINGS: There appears to be loosening of the tibial component of the patient's total knee arthroplasty, with lateral and anterior tilt of the component. The femoral component is grossly unremarkable in appearance. There is no evidence of fracture. No knee joint effusion is seen. Diffuse soft tissue swelling is noted about the knee. IMPRESSION: 1. Apparent loosening of the tibial component of the total knee arthroplasty, with lateral and anterior tilt of the component. Would correlate clinically. 2. Diffuse soft tissue swelling about the knee. Electronically Signed   By: Garald Balding M.D.   On: 08/16/2016 01:28   - pertinent xrays, CT, MRI studies were reviewed and independently  interpreted  Positive ROS: All other systems have been reviewed and were otherwise negative with the exception of those mentioned in the HPI and as above.  Physical Exam: General: Alert, no acute distress Psychiatric: Patient is competent for consent with normal mood and affect Lymphatic: No axillary or cervical lymphadenopathy Cardiovascular: Patient has atrial fibrillation. Respiratory: No cyanosis, no use of accessory musculature GI: No organomegaly, abdomen is soft and non-tender  Skin: Patient has dermatitis cellulitis with massive venous stasis swelling of the right lower extremity. There is no weeping drainage.    Neurologic: Patient does not have protective sensation bilateral lower extremities.   MUSCULOSKELETAL:  Radiographs were reviewed which shows the tibial tray component is failed due to patient's elevated BMI. Patient's previous radiographs in 2005 showed excellent alignment. Patient states that she denies any specific trauma but has had progressive weakness and pain trying to ambulate on the right lower extremity. Patient denies any specific trauma. There is no cellulitis around the knee. Clinically her  leg is straight due to the massive BMI it is difficult to tell if there is a varus alignment to the knee but radiographically the risks with the tibial tray subsiding into the tibia due to osteoporosis and elevated BMI.  Assessment: Assessment: Venous insufficiency with cellulitis and dermatitis right leg with failure of the tibial tray right knee due to elevated BMI and osteoporosis. No evidence of infection of the right knee.  Plan: Plan: Patient is not a good surgical candidate for revision of the total knee arthroplasty. Patient has no interest in trying to ambulate she wants to be able to transfer and use a manual wheelchair patient is afraid of using her electrical wheelchair. Orders are written for transfer training with strengthening with no gait training. Orders are  written for a extrawide manual wheelchair with a leg lift and thigh board to elevate her leg. Orders are written for a Profore compression wrap to help with the venous insufficiency. I will follow-up as needed. Patient will need the wrap changed weekly. I will follow-up in the office after discharge. Anticipate discharge for skilled nursing placement.  Thank you for the consult and the opportunity to see Norma Ford, [place] [phone] 6:11 PM

## 2016-08-16 NOTE — Progress Notes (Signed)
Rx Brief note:  Lovenox  Wt=131 kg, BMI=52, CrCl~68 ml/min  Rx adjusted Lovenox to 60mg  daily (~0.5 mg/kg) in pt with BMI>30  Thanks Dorrene German 08/16/2016 4:26 AM

## 2016-08-16 NOTE — Progress Notes (Signed)
Pharmacy Antibiotic Note  Norma Ford is a 82 y.o. female admitted on 08/15/2016 with cellulitis.  Pharmacy has been consulted for zosyn/vancomycin dosing.  Plan: Zosyn 3.375g IV q8h (4 hour infusion).  Vancomycin 1Gm + 1Gm = 2Gm load then 750mg  IV q12h (VT=10-15)     Temp (24hrs), Avg:98.4 F (36.9 C), Min:97.9 F (36.6 C), Max:99.1 F (37.3 C)   Recent Labs Lab 08/09/16 0328 08/10/16 08/10/16 0523 08/15/16 2326 08/15/16 2331  WBC  --   --   --  35.3*  --   CREATININE 0.76 0.8 0.79 0.80  --   LATICACIDVEN  --   --   --   --  0.99    Estimated Creatinine Clearance: 68.3 mL/min (by C-G formula based on SCr of 0.8 mg/dL).    Allergies  Allergen Reactions  . Codeine     REACTION: ITCHING    Antimicrobials this admission: 10/10 zosyn >>  10/10 vancomycin >>   Dose adjustments this admission:   Microbiology results:  BCx:   UCx:    Sputum:    MRSA PCR:   Thank you for allowing pharmacy to be a part of this patient's care.  Dorrene German 08/16/2016 12:13 AM

## 2016-08-16 NOTE — Progress Notes (Signed)
Spoke with pt and daughter at bedside concerning [facility] vs SNF. Pt pan is to discharge home with [facility]. Referral given to in house rep.

## 2016-08-16 NOTE — Progress Notes (Signed)
*  PRELIMINARY RESULTS* Vascular Ultrasound Right lower extremity venous duplex has been completed.  Preliminary findings: Technically limited due to body habitus and patient intolerence to venous compressions. No obvious DVT noted in the visualized veins of the RLE.    Landry Mellow, RDMS, RVT  08/16/2016, 10:36 AM

## 2016-08-16 NOTE — Progress Notes (Signed)
Initial Nutrition Assessment  DOCUMENTATION CODES:   Morbid obesity  INTERVENTION:  - Will d/c Ensure Enlive and order Premier Protein BID, each supplement provides 160 kcal and 30 grams of protein.  - Will order daily multivitamin with minerals. - Continue to encourage PO intakes of meals and supplements. - RD will continue to monitor for additional nutrition-related needs.  NUTRITION DIAGNOSIS:   Inadequate oral intake related to poor appetite as evidenced by per patient/family report.  GOAL:   Patient will meet greater than or equal to 90% of their needs   MONITOR:   PO intake, Supplement acceptance, Weight trends, Labs, Skin, I & O's  REASON FOR ASSESSMENT:   Malnutrition Screening Tool  ASSESSMENT:   82 y.o. female with a past medical history significant for HTN, chronic right knee pain with knee replacement and revision, Morbid obesity, and remote mycosis fungoides who presents with right leg pain and redness and fever.  Pt seen for MST. BMI indicates morbid obesity. No PO intakes documented since admission. Daughter at bedside and provides information in addition to the pt. For breakfast pt had a few bites of vegetable omelet, whole wheat toast, and ate all of pineapple; her daughter bought more pineapple and yogurt from the cafeteria as a snack for pt. Per their report, pt was d/c'ed from the hospital on 11/4 and was at rehab since that time. While at rehab, pt had a poor appetite and also did not like the food there. Since readmission pt's appetite has improved and she enjoys the food here.  Pt has upper dentures and a lower partial but states plan for transition to bottom denture as well. She states that current denture and partial fit well and do not move with eating. No swallowing difficulties. Pt states that she would like to continue eating less as she is embarrassed that it takes several staff members to move her. Encouraged pt to continue making healthier choices as  able but reminded her of the importance of adequate nutrition to maintain strength to help in moving and to participate in rehab without becoming fatigued; pt agreeable to this thought and appreciative.   Daughter reports that while at rehab, pt was not provided with Lasix. Per chart review, CBW consistent -2.5 kg since 08/12/16 which is likely d/t fluid loss. Weight has mainly been stable since June 2016. No muscle or fat wasting noted during physical assessment. Mild edema likely d/t fluid retention. Talked with pt about current diet order and how it relates to fluid retention and her overall goal of limiting weight gain.   Medications reviewed; 40 mg IV Lasix BID, PRN Zofran, PRN Miralax, 40 mEq oral KCl BID, 20 mg oral Prednisone/day, PRN Senokot. Labs reviewed; BUN: 25 mg/dL, Ca: 8.6 mg/dL.   Diet Order:  Diet Heart Room service appropriate? Yes; Fluid consistency: Thin  Skin:  Reviewed, no issues  Last BM:  10/8  Height:   Ht Readings from Last 1 Encounters:  08/16/16 5\' 2"  (1.575 m)    Weight:   Wt Readings from Last 1 Encounters:  08/16/16 283 lb 15.2 oz (128.8 kg)    Ideal Body Weight:  50 kg  BMI:  Body mass index is 51.94 kg/m.  Estimated Nutritional Needs:   Kcal:  [id number] (11-13 kcal/kg)  Protein:  75-85 grams (1.5-1.7 grams/kg IBW)  Fluid:  1.2-1.5 L/day  EDUCATION NEEDS:   No education needs identified at this time    Jarome Matin, MS, RD, LDN Inpatient Clinical Dietitian Pager #  [phone] After hours/weekend pager # [phone]

## 2016-08-16 NOTE — H&P (Signed)
History and Physical  Patient Name: Norma Ford     [id number]    DOB: 08/15/1930    DOA: 08/15/2016 PCP: Ann Held, DO  Orthopedics: Dr. Sharol Given     Patient coming from: [facility] for rehab  Chief Complaint: Leg pain and redness and fever  HPI: Norma Ford is a 82 y.o. female with a past medical history significant for HTN, chronic right knee pain with knee replacement and revision, Morbid obesity, and remote mycosis fungoides who presents with right leg pain and redness and fever.  The patient was recently admitted for new A. fib with RVR, acute diastolic CHF, broken right great toe, and ?HCAP and was here at [facility] for 10 days.  Diuresed 11L during that hospitalization, but remained newly O2 dependent at discharge.  Also treated for ?HCAP with azithromycin and Ceftin during that hospitalization, completed course.  Had been living at home prior to that but was discharged to SNF one week ago.  Since discharge, her leg swelling was much better initially, and she was doing well.  Then about 4 days ago, she started to complain of a "scratch" and pain on her right lateral leg.  Within 2 days over the weekend, this developed redness and bruising all over the leg, as well as intense right leg pain all over, with redevelopment of swelling throughout the leg.  Family are clear that the patient's right leg usually has no redness nor anything that sounds like chronic venous stasis changes.     Then yesterday, she was noted by facility staff to have fever, and today when evaluated by the provider at the nursing facility, she was judged to have cellulitis and sent to the ER.  ED course: -Temp 99.64F, heart rate 90s in Afib, respirations 19-21, BP 120/60, pulse oximetry low 90s on room air -Na 137, K 3.5, Cr 0.8, WBC 35.3K (was normal early during last admission, rose to 13K by discharge), Hgb 11.8 -Lactic acid 1.0 -BNP up to 400 pg/mL -CXR showed improvement/clearing of her bilateral  opacities and no focal pneumonia -X-ray of the right leg showed "There appears to be loosening of the tibial component of the patient's total knee arthroplasty, with lateral and anterior tilt of the component" which was not noted on radiograph from 07/31/16. -The patient was given vancomycin and piperacillin-tazobactam for sepsis and TRH were asked to evaluate for admission     The patient lived at home prior to her last admission.  She has a remote history of Mycosis fungoides for which she was treated with topicals, no further follow up or rashes.  Three weeks ago, she was admitted after a fall and fracture of the great toe and found to have Afib with RVR and acute CHF.  She was started on diltiazem drip, metoprolol and digoxin, stabilized, and discharged on dilt and metop.  Warfarin was held because of frequent falls despite CHADS2Vasc 5.    During that hospitalization, it was also noted that she was hypoxic, and there was difficulty weaning her from O2.  She ultimately was treated with antibiotics for pneumonia (azithro and ceftin) and completed a 7 day course as well as steroids and bronchodilators.  She was discharged on prednisone, scheduled to be completed on 10/10.  After discharge, she was diagnosed with thrush on 10/6 at the nursing home, and prescribed fluconazole until 10/20.      ROS: Review of Systems  Constitutional: Positive for chills, fever and malaise/fatigue.  Musculoskeletal: Positive  for falls (chronic) and joint pain (whole right leg).  Skin: Positive for rash (new redness on right leg).  All other systems reviewed and are negative.         Past Medical History:  Diagnosis Date  . Adrenal tumor 2002   right, followed by Surgery  . Barrett's esophagus 1999  . Cataracts, bilateral   . DECUBITUS ULCER, BUTTOCK 12/21/2009  . History of abnormal mammogram    2001-h/o abnormal mammo-told just calcium deposit-repeat  . HTN (hypertension)   . Hyperlipidemia   .  Kidney stone   . MYCOSIS FUNGOIDES LYMPH NODES MULTIPLE SITES 09/21/2009  . THROMBOCYTOPENIA 12/21/2009    Past Surgical History:  Procedure Laterality Date  . ABDOMINAL HYSTERECTOMY    . appendectomy    . ESOPHAGOGASTRODUODENOSCOPY  03/2002  . TONSILLECTOMY    . TOTAL KNEE ARTHROPLASTY     left 2009, right 2003  . tubal ligation      Social History: Patient lives with her son and daughter-in-law at baseline.  The patient walks with a walker.  She has chronic leg pain that limits her mobility greatly.  She does not smoke.    Allergies  Allergen Reactions  . Codeine     REACTION: ITCHING    Family history: family history includes Heart disease in her father and mother; Kidney disease in her father.  Prior to Admission medications   Medication Sig Start Date End Date Taking? Authorizing Provider  ALPRAZolam (XANAX) 0.25 MG tablet Take 1 tablet (0.25 mg total) by mouth 2 (two) times daily as needed for anxiety. 08/10/16   Mir Marry Guan, MD  aspirin EC 325 MG EC tablet Take 1 tablet (325 mg total) by mouth daily. 08/10/16   Mir Marry Guan, MD  diltiazem (CARDIZEM SR) 120 MG 12 hr capsule Take 1 capsule (120 mg total) by mouth every 12 (twelve) hours. 08/10/16   Mir Marry Guan, MD  fluconazole (DIFLUCAN) 100 MG tablet Take 100 mg by mouth daily. 08/12/16 08/26/16  Historical Provider, MD  furosemide (LASIX) 40 MG tablet Take 1 tablet (40 mg total) by mouth daily. 08/10/16   Mir Marry Guan, MD  HYDROcodone-acetaminophen (NORCO/VICODIN) 5-325 MG tablet Take 1 tablet by mouth every 4 (four) hours as needed for moderate pain. 08/10/16   Mir Marry Guan, MD  metoprolol (LOPRESSOR) 50 MG tablet Take 1 tablet (50 mg total) by mouth 2 (two) times daily. 08/10/16   Mir Marry Guan, MD  mometasone (NASONEX) 50 MCG/ACT nasal spray Place 2 sprays into the nose daily as needed (for congestion).     Historical Provider, MD  Multiple Vitamin (MULTIVITAMIN)  tablet Take 1 tablet by mouth daily.      Historical Provider, MD  Omega-3 Fatty Acids (FISH OIL PO) Take 1 tablet by mouth 2 (two) times daily.    Historical Provider, MD  polyethylene glycol powder (GLYCOLAX/MIRALAX) powder Take 17 g by mouth 2 (two) times daily as needed. 07/24/15   Rosalita Chessman Chase, DO  potassium chloride SA (K-DUR,KLOR-CON) 20 MEQ tablet Take 1 tablet (20 mEq total) by mouth daily. 08/10/16   Mir Marry Guan, MD  predniSONE (DELTASONE) 20 MG tablet Take 1 tablet (20 mg total) by mouth daily with breakfast. 08/11/16 08/16/16  Mir Marry Guan, MD  sertraline (ZOLOFT) 50 MG tablet Take 1 tablet by mouth  daily 04/22/16   Rosalita Chessman Chase, DO  simvastatin (ZOCOR) 80 MG tablet Take 1 tablet by mouth at  bedtime 08/03/15  Ann Held, DO       Physical Exam: BP 120/60 (BP Location: Left Arm)   Pulse 86   Temp 98.8 F (37.1 C) (Oral)   Resp 21   SpO2 96%  General appearance: Elderly obese adult female, alert and in no acute distress.   Eyes: Anicteric, conjunctiva pink, lids and lashes normal. PERRL.    ENT: No nasal deformity, discharge, epistaxis.  Hearing normal. OP tacky dry without lesions.   Neck: No neck masses.  Trachea midline.  No thyromegaly/tenderness. Lymph: No cervical or supraclavicular lymphadenopathy. Skin: Warm and dry.  No jaundice.  The right leg has marked redness as below.  Family show me a picture from shortly after discharge, of the right leg, and there are only mild venous stasis changes, no redness, although the whole leg is not visualized:   Cardiac: Slightly tachycardic, irregular, nl S1-S2, no murmurs appreciated.  Capillary refill is brisk.  JVP not visible.  Right LE edema.  Radial and DP pulses 2+ and symmetric. Respiratory: Normal respiratory rate and rhythm.  CTAB without rales or wheezes. Abdomen: Abdomen soft.  No TTP. No ascites, distension, hepatosplenomegaly.   MSK: No deformities or effusions.  No  cyanosis or clubbing. Neuro: Cranial nerves normal.  Sensation intact to light touch. Speech is fluent.  Muscle strength globally and symmetrically diminshed, but able to sit up with assistance.    Psych: Sensorium intact and responding to questions, attention normal.  Behavior appropriate.  Affect normal.  Judgment and insight appear normal.     Labs on Admission:  I have personally reviewed following labs and imaging studies: CBC:  Recent Labs Lab 08/15/16 2326  WBC 35.3*  NEUTROABS 32.4*  HGB 11.8*  HCT 35.2*  MCV 96.2  PLT [id number]*   Basic Metabolic Panel:  Recent Labs Lab 08/09/16 0328 08/10/16 08/10/16 0523 08/15/16 2326  NA 138 137 137 137  K 4.6  --  4.5 3.5  CL 98*  --  98* 102  CO2 35*  --  32 29  GLUCOSE 142*  --  129* 121*  BUN 30* 36* 36* 25*  CREATININE 0.76 0.8 0.79 0.80  CALCIUM 8.8*  --  8.5* 8.6*   GFR: Estimated Creatinine Clearance: 68.3 mL/min (by C-G formula based on SCr of 0.8 mg/dL).  Liver Function Tests:  Recent Labs Lab 08/15/16 2326  AST 19  ALT 65*  ALKPHOS 56  BILITOT 0.5  PROT 5.8*  ALBUMIN 2.6*   No results for input(s): LIPASE, AMYLASE in the last 168 hours. No results for input(s): AMMONIA in the last 168 hours. Coagulation Profile: No results for input(s): INR, PROTIME in the last 168 hours. Cardiac Enzymes:  Recent Labs Lab 08/09/16 1046  TROPONINI <0.03   BNP (last 3 results) No results for input(s): PROBNP in the last 8760 hours. HbA1C: No results for input(s): HGBA1C in the last 72 hours. CBG:  Recent Labs Lab 08/09/16 0736 08/10/16 0747  GLUCAP 129* 125*   Lipid Profile: No results for input(s): CHOL, HDL, LDLCALC, TRIG, CHOLHDL, LDLDIRECT in the last 72 hours. Thyroid Function Tests: No results for input(s): TSH, T4TOTAL, FREET4, T3FREE, THYROIDAB in the last 72 hours. Anemia Panel: No results for input(s): VITAMINB12, FOLATE, FERRITIN, TIBC, IRON, RETICCTPCT in the last 72 hours. Sepsis Labs: Lactic  acid 1.0 Invalid input(s): PROCALCITONIN, LACTICIDVEN No results found for this or any previous visit (from the past 240 hour(s)).       Radiological Exams on Admission: Personally reviewed  following knee x-ray report.  CXR reviewed and shows resolving pulmonary edema, no focal pneumonia: Dg Chest Port 1 View  Result Date: 08/16/2016 CLINICAL DATA:  Shortness of breath for days, increased. EXAM: PORTABLE CHEST 1 VIEW COMPARISON:  Radiographs 08/04/2016 FINDINGS: Low lung volumes persist. Improved pulmonary edema from prior. Left lung base and midlung zone atelectasis. Probable small left pleural effusion. Cardiomegaly and mediastinal contours are stable. Chronic change about both shoulders. IMPRESSION: Improved pulmonary edema. Left lung atelectasis. Probable left pleural effusion, similar to prior. Electronically Signed   By: Jeb Levering M.D.   On: 08/16/2016 01:22   Dg Knee Right Port  Result Date: 08/16/2016 CLINICAL DATA:  Chronic right leg swelling, with acute onset of erythema and pain. EXAM: PORTABLE RIGHT KNEE - 1-2 VIEW COMPARISON:  None. FINDINGS: There appears to be loosening of the tibial component of the patient's total knee arthroplasty, with lateral and anterior tilt of the component. The femoral component is grossly unremarkable in appearance. There is no evidence of fracture. No knee joint effusion is seen. Diffuse soft tissue swelling is noted about the knee. IMPRESSION: 1. Apparent loosening of the tibial component of the total knee arthroplasty, with lateral and anterior tilt of the component. Would correlate clinically. 2. Diffuse soft tissue swelling about the knee. Electronically Signed   By: Garald Balding M.D.   On: 08/16/2016 01:28    EKG: Independently reviewed. Rate 98, QTc 426, Afib without ST changes.    Assessment/Plan  1. Cellulitis:  SIRS syndrome without evidence of end organ damage, altered mental status or sepsis at this time.  A non-purulent  cellulitis with SIRS syndrome without end organ damage.   -Ceftriaxone 2g daily  -Elevate the extremity -Pain control with Norco PRN -Check US doppler in right leg -Follow blood cultures -Will discuss radiograph report of hardware loosening with patient's primary orthopedist    2. Acute on chronic diastolic CHF:  Has increased BNP from previous, plus some peripheral edema and  -Furosemide 40 mg IV BID tomorrow -Strict I/O, daily weights -Daily BMP -Transition back to oral furosemide after tomorrow unless worsening  3. HTN:  -Continue diltiazem and metoprolol  4. Chronic bronchitis:  No wheezing on exam. -Albuterol PRN   5. Paroxysmal Afib:  CHADS2Vasc 5 (HTN, CHF, Age, Gender).  Not on warfarin given frequent falls, on aspirin only. -Continue metoprolol and diltiazem -Continue aspirin 325  6. Thrombocytopenia:  Mild.  Stable.  7. Other medications:  -Continue alprazolam for sleep PRN -Continue fluconazole -Continue prednisone taper until 10/10 -Continue statin -Continue sertraline    DVT prophylaxis: Lovenox  Code Status: FULL  Family Communication: Daughters at bedside.  An opportunity for questions was given and all questions were answered.  CODE STATUS confirmed.  Disposition Plan: Anticipate IV antibiotics and follow blood cultures. Consults called: None overnight Admission status: INPATIENT       Medical decision making: Patient seen at 3:21 AM on 08/16/2016.  The patient was discussed with Abigail Butts, PA-C.  What exists of the patient's chart was reviewed in depth and summarized above.  Clinical condition: tachycardic and requiring IV diuresis in setting of CHF and infection.        Edwin Dada [facility] Pager [phone]      At the time of admission, it appears that the appropriate admission status for this patient is INPATIENT. This is judged to be reasonable and necessary in order to provide the required intensity of  service to ensure the patient's safety given  the presenting symptoms, physical exam findings, and initial radiographic and laboratory data in the context of their chronic comorbidities.  Together, these circumstances are felt to place her/him at high risk for further clinical deterioration threatening life, limb, or organ. The following factors support the admission status of inpatient:   A. The patient's presenting symptoms include leg pain, fever, malaise B. The worrisome physical exam findings include redness, swelling, pain of the right leg C. The initial radiographic and laboratory data are worrisome because of elevated BNP, leukocytosis D. The chronic co-morbidities include diastolic CHF, Afib, advanced age. E. Patient requires inpatient status due to high intensity of service, high risk for further deterioration and high frequency of surveillance required because of this acute illness that poses a threat to life or bodily function in this patient who will need diuresis for the next 24 hours, IV antibiotics and likely more than 24 hours of in hospital monitoring for her infection. F. I certify that at the point of admission it is my clinical judgment that the patient will require inpatient hospital care spanning beyond 2 midnights from the point of admission and that early discharge would result in unnecessary risk of decompensation and readmission or threat to life, limb or bodily function.

## 2016-08-16 NOTE — Consult Note (Signed)
   [facility] Inpatient Consult   08/16/2016  Norma Ford December 02, 1929 PH:[id number]    [facility] Care Management referral received due to recent readmission. Norma Ford was recently at SNF and the plans is to return home with family. Spoke with patient and daughter, Norma Ford at bedside about [place] Management program in detail. They were agreeable and written consent signed.  Explained to patient and daughter that Norma Ford will receive post hospital transition of care calls and will be evaluated for monthly home visits. Confirmed Primary Care MD as Norma Ford.  Confirmed best contact number as [phone] for Norma Ford (daughter-in-law). Explained that [facility] Management will not interfere or replace services provided by home health.  Left [facility] Care Management packet and contact information at bedside. Will make inpatient RNCM aware that patient will be followed by [place] Management post hospital discharge.  Discussed that Norma Ford lives with her son and daughter-in-law, Norma Ford. Norma Ford is Norma Ford's caregiver and should be contacted for post transition of care calls. Norma Ford, daughter, states Norma Ford is at home resting right now and she will make Norma Ford aware of [place] Management follow up. Answered many questions about [facility] Care Management. Denies having issues with obtaining medications or with transportation at this point.  Will request Norma Ford to be assigned for transition of care. She has history of recent hospital admission, cellulitis, HTN, CHF, HLD.    Norma Rolling, MSN-Ed, RN,BSN [facility] Liaison [phone]

## 2016-08-16 NOTE — Progress Notes (Signed)
PROGRESS NOTE                                                                                                                                                                                                             Patient Demographics:    Norma Ford, is a 82 y.o. female, DOB - May 14, 1930, [id number]  Admit date - 08/15/2016   Admitting Physician Edwin Dada, MD  Outpatient Primary MD for the patient is Ann Held, DO  LOS - 0  Outpatient Specialists: Dr Sharol Given  Chief Complaint  Patient presents with  . Cellulitis       Brief Narrative  82 year old morbidly obese female with history of hypertension, chronic right knee pain with history of knee replacement. She was recently hospitalization for acute diastolic CHF, A. fib with RVR and right great toe fracture. She was aggressively diuresed, treated for ?HCAP and bronchitis (with antibiotic and prednisone) and was discharged to skilled nursing facility. Patient informs that since discharge she noticed swelling in her leg and developed pain in her right lateral leg. Within 2-3 days she developed redness and bruising in her leg with increased pain in right lower leg and her right knee. She denies any trauma, fever or chills.  In the ED vitals were stable. Blood work showed significant leukocytosis of 35.3K, hemoglobin of 11.8, BNP of 400 and acute thrombocytopenia with platelets of 107. Patient admitted for cellulitis of the right leg.    Subjective:   Patient complains of some pain in her right lower leg and right knee.   Assessment  & Plan :    Principal Problem:   Cellulitis Of right leg Started on empiric Rocephin.Monitor WBC closely. Keep leg elevated. Pain control with when necessary Vicodin. Doppler lower extremity negative for DVT.  Active Problems: Leukocytosis significant leukocytosis is likely a combination of underlying  cellulitis and recently being on steroids. (Patient was treated with IV Solu-Medrol while in the hospital until 10/4 and discharged on oral prednisone until today. Family however informed that patient did not receive any prednisone at the facility)    Thrombocytopenia (HCC) Significant drop from >300 one week back. Possible secondary to acute infection. Not on any medication that could potentially cause acute, cytopenia. Will monitor closely.    Hypertensive heart disease with diastolic CHF [facility]) Recently hospitalized for acute  CHF. Her BNP is mildly elevated but clinically not in failure. She was started on IV Lasix twice a day which I will change to low-dose oral Lasix daily.    Osteoarthrosis involving lower leg Chronic right knee pain which has worsened over the past week or so. X-ray shows loose and hardware of her replaced knee. I have consulted Dr. Sharol Given who will see the patient. She was also supposed to see him tomorrow for her right toe fracture. PT evaluation and pain control.     Paroxysmal atrial fibrillation (HCC) Rate controlled. Continue aspirin and Cardizem.   Oral thrush Patient has been on 2 week course of fluconazole at the facility. No thrush on exam today. Will limit treatment to one week duration only.    Morbid obesity ([place])      Code Status : Full code  Family Communication  : Daughter at bedside  Disposition Plan  : Possibly needs to go back to SNF  Barriers For Discharge : Active symptoms  Consults  :   Orthopedics (Dr. Sharol Given)  Procedures  : Doppler lower extremity.  DVT Prophylaxis  :  Lovenox -   Lab Results  Component Value Date   PLT 91 (L) 08/16/2016    Antibiotics  :    Anti-infectives    Start     Dose/Rate Route Frequency Ordered Stop   08/16/16 1000  fluconazole (DIFLUCAN) tablet 100 mg     100 mg Oral Daily 08/16/16 0628 08/18/16 0959   08/16/16 0800  piperacillin-tazobactam (ZOSYN) IVPB 3.375 g  Status:  Discontinued     3.375  g 12.5 mL/hr over 240 Minutes Intravenous Every 8 hours 08/16/16 0105 08/16/16 0407   08/16/16 0800  cefTRIAXone (ROCEPHIN) 2 g in dextrose 5 % 50 mL IVPB     2 g 100 mL/hr over 30 Minutes Intravenous Every 24 hours 08/16/16 0407     08/16/16 0415  vancomycin (VANCOCIN) IVPB 1000 mg/200 mL premix  Status:  Discontinued     1,000 mg 200 mL/hr over 60 Minutes Intravenous NOW 08/16/16 0407 08/16/16 0422   08/16/16 0000  piperacillin-tazobactam (ZOSYN) IVPB 3.375 g     3.375 g 100 mL/hr over 30 Minutes Intravenous  Once 08/15/16 2355 08/16/16 0139   08/16/16 0000  vancomycin (VANCOCIN) IVPB 1000 mg/200 mL premix     1,000 mg 200 mL/hr over 60 Minutes Intravenous  Once 08/15/16 2355 08/16/16 0307        Objective:   Vitals:   08/16/16 0200 08/16/16 0430 08/16/16 1004 08/16/16 1451  BP: 115/88 96/87 109/63 125/62  Pulse: 95 65 87 77  Resp: 19 19  18   Temp:  99 F (37.2 C)  98.6 F (37 C)  TempSrc:  Oral  Oral  SpO2: 96% 98%  99%  Weight:  128.8 kg (283 lb 15.2 oz)    Height:  5\' 2"  (1.575 m)      Wt Readings from Last 3 Encounters:  08/16/16 128.8 kg (283 lb 15.2 oz)  08/12/16 131.3 kg (289 lb 8 oz)  08/11/16 128.8 kg (284 lb)     Intake/Output Summary (Last 24 hours) at 08/16/16 1511 Last data filed at 08/16/16 0600  Gross per 24 hour  Intake              120 ml  Output                0 ml  Net  120 ml     Physical Exam  Gen: Really obese female not in distress HEENT:  moist mucosa, supple neck Chest: clear b/l, no added sounds CVS: S1 and S2 irregular, no murmurs rubs or gallop GI: soft, NT, ND, BS+ Musculoskeletal: Erythema with swelling over right lower leg, mildly swollen right knee with limited ROM due to pain. Bruising of the right great toe with swelling. CNS: Alert and oriented    Data Review:    CBC  Recent Labs Lab 08/15/16 2326 08/16/16 0507  WBC 35.3* 31.7*  HGB 11.8* 11.5*  HCT 35.2* 33.7*  PLT 107* 91*  MCV 96.2 94.1  MCH  32.2 32.1  MCHC 33.5 34.1  RDW 14.8 14.4  LYMPHSABS 1.1  --   MONOABS 1.8*  --   EOSABS 0.0  --   BASOSABS 0.0  --     Chemistries   Recent Labs Lab 08/10/16 08/10/16 0523 08/15/16 2326  NA 137 137 137  K  --  4.5 3.5  CL  --  98* 102  CO2  --  32 29  GLUCOSE  --  129* 121*  BUN 36* 36* 25*  CREATININE 0.8 0.79 0.80  CALCIUM  --  8.5* 8.6*  AST  --   --  19  ALT  --   --  65*  ALKPHOS  --   --  56  BILITOT  --   --  0.5   ------------------------------------------------------------------------------------------------------------------ No results for input(s): CHOL, HDL, LDLCALC, TRIG, CHOLHDL, LDLDIRECT in the last 72 hours.  Lab Results  Component Value Date   HGBA1C 5.2 07/04/2016   ------------------------------------------------------------------------------------------------------------------ No results for input(s): TSH, T4TOTAL, T3FREE, THYROIDAB in the last 72 hours.  Invalid input(s): FREET3 ------------------------------------------------------------------------------------------------------------------ No results for input(s): VITAMINB12, FOLATE, FERRITIN, TIBC, IRON, RETICCTPCT in the last 72 hours.  Coagulation profile No results for input(s): INR, PROTIME in the last 168 hours.  No results for input(s): DDIMER in the last 72 hours.  Cardiac Enzymes No results for input(s): CKMB, TROPONINI, MYOGLOBIN in the last 168 hours.  Invalid input(s): CK ------------------------------------------------------------------------------------------------------------------    Component Value Date/Time   BNP 419.6 (H) 08/15/2016 2326    Inpatient Medications  Scheduled Meds: . aspirin  325 mg Oral Daily  . atorvastatin  40 mg Oral q1800  . cefTRIAXone (ROCEPHIN)  IV  2 g Intravenous Q24H  . diltiazem  120 mg Oral Q12H  . enoxaparin (LOVENOX) injection  60 mg Subcutaneous Daily  . fluconazole  100 mg Oral Daily  . furosemide  40 mg Intravenous BID  .  mouth rinse  15 mL Mouth Rinse BID  . metoprolol  50 mg Oral BID  . multivitamin with minerals  1 tablet Oral Daily  . potassium chloride  40 mEq Oral BID  . protein supplement shake  11 oz Oral BID BM  . saccharomyces boulardii  250 mg Oral BID  . sertraline  50 mg Oral Daily   Continuous Infusions:  PRN Meds:.acetaminophen **OR** acetaminophen, albuterol, ALPRAZolam, HYDROcodone-acetaminophen, ondansetron **OR** ondansetron (ZOFRAN) IV, polyethylene glycol, senna-docusate  Micro Results Recent Results (from the past 240 hour(s))  MRSA PCR Screening     Status: None   Collection Time: 08/16/16  5:07 AM  Result Value Ref Range Status   MRSA by PCR NEGATIVE NEGATIVE Final    Comment:        The GeneXpert MRSA Assay (FDA approved for NASAL specimens only), is one component of a comprehensive MRSA colonization surveillance program. It is  not intended to diagnose MRSA infection nor to guide or monitor treatment for MRSA infections.     Radiology Reports Dg Chest Port 1 View  Result Date: 08/16/2016 CLINICAL DATA:  Shortness of breath for days, increased. EXAM: PORTABLE CHEST 1 VIEW COMPARISON:  Radiographs 08/04/2016 FINDINGS: Low lung volumes persist. Improved pulmonary edema from prior. Left lung base and midlung zone atelectasis. Probable small left pleural effusion. Cardiomegaly and mediastinal contours are stable. Chronic change about both shoulders. IMPRESSION: Improved pulmonary edema. Left lung atelectasis. Probable left pleural effusion, similar to prior. Electronically Signed   By: Jeb Levering M.D.   On: 08/16/2016 01:22   Dg Chest Port 1 View  Result Date: 08/04/2016 CLINICAL DATA:  F/u pneumonia; sob and wheezing EXAM: PORTABLE CHEST 1 VIEW COMPARISON:  08/01/2016 FINDINGS: Exam is lordotic. Extremely low lung volumes. There is increased perihilar vascular congestion. New perihilar airspace disease. No pneumothorax IMPRESSION: New perihilar airspace disease suggests  pulmonary edema. Low lung volumes the Electronically Signed   By: Suzy Bouchard M.D.   On: 08/04/2016 08:28   Dg Chest Portable 1 View  Result Date: 08/01/2016 CLINICAL DATA:  Generalized weakness for 2 weeks. Fell at home tonight. Leg pain. History of hypertension, hyperlipidemia. EXAM: PORTABLE CHEST 1 VIEW COMPARISON:  Chest radiograph November 10, 2014 FINDINGS: Cardiac silhouette is mildly enlarged, even with CAD considerations low inspiratory examination. Pulmonary vascular congestion interstitial prominence. Small bilateral pleural effusions. No pneumothorax. Severe degenerative change of the shoulders. IMPRESSION: Mild cardiomegaly.  Interstitial edema with small pleural effusions. Electronically Signed   By: Elon Alas M.D.   On: 08/01/2016 01:51   Dg Knee Right Port  Result Date: 08/16/2016 CLINICAL DATA:  Chronic right leg swelling, with acute onset of erythema and pain. EXAM: PORTABLE RIGHT KNEE - 1-2 VIEW COMPARISON:  None. FINDINGS: There appears to be loosening of the tibial component of the patient's total knee arthroplasty, with lateral and anterior tilt of the component. The femoral component is grossly unremarkable in appearance. There is no evidence of fracture. No knee joint effusion is seen. Diffuse soft tissue swelling is noted about the knee. IMPRESSION: 1. Apparent loosening of the tibial component of the total knee arthroplasty, with lateral and anterior tilt of the component. Would correlate clinically. 2. Diffuse soft tissue swelling about the knee. Electronically Signed   By: Garald Balding M.D.   On: 08/16/2016 01:28   Dg Foot Complete Right  Result Date: 08/01/2016 CLINICAL DATA:  Acute onset of right foot pain, especially at the great toe, after fall. Initial encounter. EXAM: RIGHT FOOT COMPLETE - 3+ VIEW COMPARISON:  None. FINDINGS: There is a comminuted fracture involving the first proximal phalanx, with 2 fracture lines extending to the first  metatarsophalangeal joint, and mild impaction of a central fragment. No additional fractures are seen. There is no evidence of talar subluxation; the subtalar joint is unremarkable in appearance. A plantar calcaneal spur is noted. Diffuse dorsal soft tissue swelling is noted at the foot, with scattered soft tissue calcifications. IMPRESSION: 1. Comminuted fracture of the first proximal phalanx, with 2 fracture lines extending to the first metatarsophalangeal joint, and mild impaction of a central fragment. 2. Diffuse dorsal soft tissue swelling, with scattered soft tissue calcifications. Electronically Signed   By: Garald Balding M.D.   On: 08/01/2016 01:01   Dg Femur Min 2 Views Left  Result Date: 08/01/2016 CLINICAL DATA:  82 year old female with bilateral leg pain and fall. EXAM: LEFT FEMUR 2 VIEWS COMPARISON:  Right  femur radiograph dated 07/31/2016 FINDINGS: There is no acute fracture or dislocation. The bones are osteopenic. There is a total left knee arthroplasty. There are osteoarthritic changes of the left hip. There is spurring of the superior patella along the insertion of the quadriceps tendon. No significant joint effusion. The soft tissues are grossly unremarkable. IMPRESSION: No acute fracture or dislocation. Electronically Signed   By: Anner Crete M.D.   On: 08/01/2016 00:59   Dg Femur, Min 2 Views Right  Result Date: 08/01/2016 CLINICAL DATA:  Status post fall, with bilateral femur pain. Initial encounter. EXAM: RIGHT FEMUR 2 VIEWS COMPARISON:  None. FINDINGS: There is no evidence of fracture or dislocation. The right femur appears grossly intact. The right hip joint is grossly unremarkable. The patient's right knee arthroplasty is grossly unremarkable in appearance, without evidence of loosening. No definite soft tissue abnormalities are characterized on radiograph. No knee joint effusion is seen. IMPRESSION: No evidence of fracture or dislocation. Right knee arthroplasty is  unremarkable in appearance. Electronically Signed   By: Garald Balding M.D.   On: 08/01/2016 01:03    Time Spent in minutes  25   Louellen Molder M.D on 08/16/2016 at 3:11 PM  Between 7am to 7pm - Pager - [phone]  After 7pm go to [email or web address] - password [email or web address]  [facility] -  Office  [phone]

## 2016-08-17 ENCOUNTER — Encounter: Payer: Self-pay | Admitting: Cardiovascular Disease

## 2016-08-17 LAB — BASIC METABOLIC PANEL
ANION GAP: 6 (ref 5–15)
BUN: 23 mg/dL — AB (ref 6–20)
CALCIUM: 8.3 mg/dL — AB (ref 8.9–10.3)
CO2: 31 mmol/L (ref 22–32)
Chloride: 101 mmol/L (ref 101–111)
Creatinine, Ser: 0.59 mg/dL (ref 0.44–1.00)
GFR calc Af Amer: >60 mL/min (ref >60)
GFR calc non Af Amer: >60 mL/min (ref >60)
GLUCOSE: 100 mg/dL — AB (ref 65–99)
Potassium: 4.1 mmol/L (ref 3.5–5.1)
Sodium: 138 mmol/L (ref 135–145)

## 2016-08-17 LAB — CBC
HEMATOCRIT: 33 % — AB (ref 36.0–46.0)
HEMOGLOBIN: 10.9 g/dL — AB (ref 12.0–15.0)
MCH: 31.3 pg (ref 26.0–34.0)
MCHC: 33 g/dL (ref 30.0–36.0)
MCV: 94.8 fL (ref 78.0–100.0)
Platelets: 103 10*3/uL — ABNORMAL LOW (ref 150–400)
RBC: 3.48 MIL/uL — ABNORMAL LOW (ref 3.87–5.11)
RDW: 14.5 % (ref 11.5–15.5)
WBC: 24.2 10*3/uL — ABNORMAL HIGH (ref 4.0–10.5)

## 2016-08-17 MED ORDER — FLUCONAZOLE 100 MG PO TABS: 100.0000 mg | ORAL_TABLET | Freq: Every day | ORAL | Status: DC

## 2016-08-17 NOTE — Progress Notes (Signed)
Unable to place extrawide manual wheelchair with a leg lift and thigh board to elevate her leg or HH orders related to this pt being Medicare and need a face to face.

## 2016-08-17 NOTE — Evaluation (Signed)
Physical Therapy Evaluation Patient Details Name: Norma Ford MRN: [id number] DOB: 05/27/1930 Today's Date: 08/17/2016   History of Present Illness  82 y/o female with h/o obesity, R TKA, hypertension, hyperlipidemia, depression, adrenal tumor, Barrett's esophagus, recent admission for right great toe fracture and new onset atrial fibrillation w/ RVR. and admitted 08/15/16 for R lower extremity cellulitis  Clinical Impression  Pt admitted with above diagnosis. Pt currently with functional limitations due to the deficits listed below (see PT Problem List).  Pt will benefit from skilled PT to increase their independence and safety with mobility to allow discharge to the venue listed below.   Pt reports she plans to d/c home (declines SNF) with manual w/c so initiated safe transfers and w/c mobility training.     Follow Up Recommendations Home health PT;Supervision/Assistance - 24 hour    Equipment Recommendations  Wheelchair cushion (measurements PT);Wheelchair (measurements PT) (24 in width w/c with removeable, elevating leg rests)    Recommendations for Other Services       Precautions / Restrictions Precautions Precautions: Fall Other Brace/Splint: post op shoe on R (from recent admission) - not in room Restrictions Other Position/Activity Restrictions: WBAT RLE with post op shoe from previous admission, transfer okay however no ambulation per ortho MD, no new WBing orders      Mobility  Bed Mobility Overal bed mobility: Needs Assistance;+2 for physical assistance Bed Mobility: Supine to Sit;Sit to Supine     Supine to sit: Supervision;HOB elevated Sit to supine: Mod assist   General bed mobility comments: increased time and effort to get to EOB, assist for Bil LEs onto bed  Transfers Overall transfer level: Needs assistance Equipment used: Rolling walker (2 wheeled) Transfers: Sit to/from [employer] Sit to Stand: Min assist;+2 physical assistance;+2  safety/equipment Stand pivot transfers: Min guard;+2 safety/equipment       General transfer comment: verbal cues for attempting to keep R LE NWB (no orders) however pt appears to perform PWB despite cues, attempted scoot transfer however pt unable so performed sit to stand with RW and pt able to better self assist, performed bed <-> w/c  Ambulation/Gait                Hotel manager mobility: Yes Wheelchair propulsion: Both upper extremities Wheelchair parts: Needs assistance Distance: 15 Wheelchair Assistance Details (indicate cue type and reason): pt propelled w/c 15 feet however requires assist for tight spaces, turning, and set up, initiated education on w/c parts however pt will need reinforcement  Modified Rankin (Stroke Patients Only)       Balance                                             Pertinent Vitals/Pain Pain Assessment: 0-10 Pain Score: 7  Pain Location: R LE Pain Descriptors / Indicators: Aching;Tender Pain Intervention(s): Limited activity within patient's tolerance;Monitored during session;Repositioned;Ice applied;Patient requesting pain meds-RN notified    Home Living Family/patient expects to be discharged to:: Private residence Living Arrangements: Other relatives Available Help at Discharge: Available 24 hours/day Type of Home: House Home Access: Level entry     Home Layout: One level Home Equipment: Bedside commode;Electric scooter;Walker - 2 wheels Additional Comments: sponge bathes    Prior Function Level of Independence: Needs assistance   Gait /  Transfers Assistance Needed: amb with walker household distances; refuses to use her power chair (feels she needs more training)  ADL's / Homemaking Assistance Needed: assistance for adls from caregiver.  She can feed herself        Hand Dominance        Extremity/Trunk Assessment     RUE Deficits /  Details: has bil UE pain and weakness.  Reports rotator cuff problems bilaterally.  Able to lift RUE approximately 20 degrees FF; 40 for LUE.  Does not abduct.  hands wfls  - per recent admission, observed similiar during session     LUE Deficits / Details: see above   Lower Extremity Assessment: RLE deficits/detail RLE Deficits / Details: requires assist for mobility, also painful to touch       Communication   Communication: No difficulties  Cognition Arousal/Alertness: Awake/alert Behavior During Therapy: WFL for tasks assessed/performed Overall Cognitive Status: Within Functional Limits for tasks assessed                      General Comments      Exercises     Assessment/Plan    PT Assessment Patient needs continued PT services  PT Problem List Decreased strength;Decreased range of motion;Decreased activity tolerance;Decreased mobility;Decreased balance;Decreased knowledge of use of DME;Pain          PT Treatment Interventions DME instruction;Functional mobility training;Therapeutic exercise;Therapeutic activities;Patient/family education;Wheelchair mobility training    PT Goals (Current goals can be found in the Care Plan section)  Acute Rehab PT Goals PT Goal Formulation: With patient Time For Goal Achievement: 08/24/16 Potential to Achieve Goals: Good    Frequency Min 3X/week   Barriers to discharge        Co-evaluation               End of Session   Activity Tolerance: Patient tolerated treatment well Patient left: in bed;with call bell/phone within reach;with bed alarm set Nurse Communication: Patient requests pain meds         Time: 1106-1200 PT Time Calculation (min) (ACUTE ONLY): 54 min   Charges:   PT Evaluation $PT Eval Moderate Complexity: 1 Procedure PT Treatments $Therapeutic Activity: 23-37 mins   PT G Codes:        Osamu Olguin,KATHrine E 08/17/2016, 2:44 PM Carmelia Bake, PT, DPT 08/17/2016 Pager: [phone]

## 2016-08-17 NOTE — Consult Note (Signed)
[facility] Nurse wound consult note Reason for Consult: Profore wrap to right lower extremity Wound type: No open areas noted  Dr. Aileen Fass present to assess and evaluate patient prior to Profore compression wrap application to right lower leg.  Dr. Olevia Bowens does NOT want compression wrap at this time.  He wishes to leave the extremity open for continuous observation as portions of the leg remain reddened.  Patient currently being treated for cellulitis of the extremity.  Discussed POC with patient and bedside nurse.  Re consult if needed, will not follow at this time. Thanks Val Riles MSN, RN, CNS-BC, [employer]

## 2016-08-17 NOTE — Progress Notes (Signed)
[facility] PROGRESS NOTE    Progress Note  KIMBERLI MORRIS  [id number] DOB: 1930-05-23 DOA: 08/15/2016 PCP: Ann Held, DO     Brief Narrative:   Norma Ford is an 82 y.o. female past medical history of morbid obesity, chronic right knee pain with history of knee replacement recently hospitalized for acute diastolic heart failure with atrial fibrillation and RVR and right great toe fracture, comes into the hospital for right lower extremity swelling and erythema juice found to have a white count 35,000 in the ED.  Assessment/Plan:   Leucocytosis likely due to Right lower extremity Cellulitis Start on IV Rocephin, oral narcotics for pain lower extremity Doppler negative for DVT. She has remained afebrile, her leukocytosis is low to improve.  Thrombocytopenia: Question of infectious etiology Her HIT 4's score, S3 which points to low probably predict continue to improve slowly. We'll continue to monitor. Lower extremity Doppler was negative for DVT.  Hypertensive heart disease with chronic diastolic heart failure: Continue current regimen no changes were made.  Osteoarthrosis involving the lower leg: She is chronic knee pain for which she takes Vicodin, orthopedic surgeon was consulted which she was supposed to see as an outpatient today he recommended no further surgical intervention as she is not a good candidate and PT evaluation.  Morbid obesity ([place]) Counseling.  Paroxysmal atrial fibrillation (HCC) Control continue aspirin 325.  Acute on chronic diastolic heart failure ([place]) She seems to be euvolemic continue current regimen.  DVT prophylaxis: lovenox Family Communication:daughter Disposition Plan/Barrier to D/C: Unable to determined. Code Status:     Code Status Orders        Start     Ordered   08/16/16 0408  Full code  Continuous     08/16/16 0407    Code Status History    Date Active Date Inactive Code Status Order ID Comments User  Context   08/01/2016  2:52 AM 08/10/2016  9:28 PM Full Code [id number]  Ivor Costa, MD ED    Advance Directive Documentation   Flowsheet Row Most Recent Value  Type of Advance Directive  Out of facility DNR (pink MOST or yellow form)  Pre-existing out of facility DNR order (yellow form or pink MOST form)  No data  "MOST" Form in Place?  No data        IV Access:    Peripheral IV   Procedures and diagnostic studies:   Dg Chest Port 1 View  Result Date: 08/16/2016 CLINICAL DATA:  Shortness of breath for days, increased. EXAM: PORTABLE CHEST 1 VIEW COMPARISON:  Radiographs 08/04/2016 FINDINGS: Low lung volumes persist. Improved pulmonary edema from prior. Left lung base and midlung zone atelectasis. Probable small left pleural effusion. Cardiomegaly and mediastinal contours are stable. Chronic change about both shoulders. IMPRESSION: Improved pulmonary edema. Left lung atelectasis. Probable left pleural effusion, similar to prior. Electronically Signed   By: Jeb Levering M.D.   On: 08/16/2016 01:22   Dg Knee Right Port  Result Date: 08/16/2016 CLINICAL DATA:  Chronic right leg swelling, with acute onset of erythema and pain. EXAM: PORTABLE RIGHT KNEE - 1-2 VIEW COMPARISON:  None. FINDINGS: There appears to be loosening of the tibial component of the patient's total knee arthroplasty, with lateral and anterior tilt of the component. The femoral component is grossly unremarkable in appearance. There is no evidence of fracture. No knee joint effusion is seen. Diffuse soft tissue swelling is noted about the knee. IMPRESSION: 1. Apparent loosening of the tibial  component of the total knee arthroplasty, with lateral and anterior tilt of the component. Would correlate clinically. 2. Diffuse soft tissue swelling about the knee. Electronically Signed   By: Garald Balding M.D.   On: 08/16/2016 01:28     Medical Consultants:    None.  Anti-Infectives:   None  Subjective:    CORLYN DEVER she relates that her pain is controlled, she has a small ulcer on her back of her thigh  Objective:    Vitals:   08/16/16 1004 08/16/16 1451 08/16/16 2013 08/17/16 0400  BP: 109/63 125/62 100/65 107/65  Pulse: 87 77 75 71  Resp:  18 18 16   Temp:  98.6 F (37 C) 98.4 F (36.9 C) 98.6 F (37 C)  TempSrc:  Oral Oral Oral  SpO2:  99% 97% 98%  Weight:    128.6 kg (283 lb 8.2 oz)  Height:        Intake/Output Summary (Last 24 hours) at 08/17/16 1306 Last data filed at 08/17/16 0925  Gross per 24 hour  Intake              600 ml  Output              350 ml  Net              250 ml   Filed Weights   08/16/16 0430 08/17/16 0400  Weight: 128.8 kg (283 lb 15.2 oz) 128.6 kg (283 lb 8.2 oz)    Exam: General exam: In no acute distress. Respiratory system: Good air movement and clear to auscultation. Cardiovascular system: S1 & S2 heard, RRR. No JVD. Gastrointestinal system: Abdomen is nondistended, soft and nontender.  Central nervous system: Alert and oriented. No focal neurological deficits. Extremities: No pedal edema. Skin: No rashes, lesions or ulcers Psychiatry: Judgement and insight appear normal. Mood & affect appropriate.    Data Reviewed:    Labs: Basic Metabolic Panel:  Recent Labs Lab 08/15/16 2326 08/17/16 0346  NA 137 138  K 3.5 4.1  CL 102 101  CO2 29 31  GLUCOSE 121* 100*  BUN 25* 23*  CREATININE 0.80 0.59  CALCIUM 8.6* 8.3*   GFR Estimated Creatinine Clearance: 67.4 mL/min (by C-G formula based on SCr of 0.59 mg/dL). Liver Function Tests:  Recent Labs Lab 08/15/16 2326  AST 19  ALT 65*  ALKPHOS 56  BILITOT 0.5  PROT 5.8*  ALBUMIN 2.6*   No results for input(s): LIPASE, AMYLASE in the last 168 hours. No results for input(s): AMMONIA in the last 168 hours. Coagulation profile No results for input(s): INR, PROTIME in the last 168 hours.  CBC:  Recent Labs Lab 08/15/16 2326 08/16/16 0507 08/17/16 0346  WBC 35.3* 31.7* 24.2*    NEUTROABS 32.4*  --   --   HGB 11.8* 11.5* 10.9*  HCT 35.2* 33.7* 33.0*  MCV 96.2 94.1 94.8  PLT 107* 91* 103*   Cardiac Enzymes: No results for input(s): CKTOTAL, CKMB, CKMBINDEX, TROPONINI in the last 168 hours. BNP (last 3 results) No results for input(s): PROBNP in the last 8760 hours. CBG: No results for input(s): GLUCAP in the last 168 hours. D-Dimer: No results for input(s): DDIMER in the last 72 hours. Hgb A1c: No results for input(s): HGBA1C in the last 72 hours. Lipid Profile: No results for input(s): CHOL, HDL, LDLCALC, TRIG, CHOLHDL, LDLDIRECT in the last 72 hours. Thyroid function studies: No results for input(s): TSH, T4TOTAL, T3FREE, THYROIDAB in the last 72  hours.  Invalid input(s): FREET3 Anemia work up: No results for input(s): VITAMINB12, FOLATE, FERRITIN, TIBC, IRON, RETICCTPCT in the last 72 hours. Sepsis Labs:  Recent Labs Lab 08/15/16 2326 08/15/16 2331 08/16/16 0507 08/17/16 0346  WBC 35.3*  --  31.7* 24.2*  LATICACIDVEN  --  0.99  --   --    Microbiology Recent Results (from the past 240 hour(s))  Blood Culture (routine x 2)     Status: None (Preliminary result)   Collection Time: 08/15/16 11:23 PM  Result Value Ref Range Status   Specimen Description BLOOD RIGHT FOREARM  Final   Special Requests BOTTLES DRAWN AEROBIC AND ANAEROBIC 5CC  Final   Culture   Final    NO GROWTH < 12 HOURS Performed at [facility]    Report Status PENDING  Incomplete  Blood Culture (routine x 2)     Status: None (Preliminary result)   Collection Time: 08/16/16 12:41 AM  Result Value Ref Range Status   Specimen Description BLOOD RIGHT ANTECUBITAL  Final   Special Requests BOTTLES DRAWN AEROBIC AND ANAEROBIC 5CC EACH  Final   Culture   Final    NO GROWTH < 12 HOURS Performed at [facility]    Report Status PENDING  Incomplete  MRSA PCR Screening     Status: None   Collection Time: 08/16/16  5:07 AM  Result Value Ref Range Status   MRSA by  PCR NEGATIVE NEGATIVE Final    Comment:        The GeneXpert MRSA Assay (FDA approved for NASAL specimens only), is one component of a comprehensive MRSA colonization surveillance program. It is not intended to diagnose MRSA infection nor to guide or monitor treatment for MRSA infections.      Medications:   . aspirin  325 mg Oral Daily  . atorvastatin  40 mg Oral q1800  . cefTRIAXone (ROCEPHIN)  IV  2 g Intravenous Q24H  . diltiazem  120 mg Oral Q12H  . enoxaparin (LOVENOX) injection  60 mg Subcutaneous Daily  . [START ON 08/18/2016] fluconazole  100 mg Oral Daily  . furosemide  40 mg Oral Daily  . mouth rinse  15 mL Mouth Rinse BID  . metoprolol  50 mg Oral BID  . multivitamin with minerals  1 tablet Oral Daily  . potassium chloride  40 mEq Oral BID  . protein supplement shake  11 oz Oral BID BM  . saccharomyces boulardii  250 mg Oral BID  . sertraline  50 mg Oral Daily   Continuous Infusions:   Time spent: 25 min   LOS: 1 day   Charlynne Cousins  [facility] Pager [phone]  *Please refer to [email or web address], password [email or web address] to get updated schedule on who will round on this patient, as hospitalists switch teams weekly. If 7PM-7AM, please contact night-coverage at [email or web address], password [email or web address] for any overnight needs.  08/17/2016, 1:06 PM

## 2016-08-18 LAB — BASIC METABOLIC PANEL
ANION GAP: 4 — AB (ref 5–15)
BUN: 24 mg/dL — AB (ref 6–20)
CALCIUM: 8.5 mg/dL — AB (ref 8.9–10.3)
CO2: 32 mmol/L (ref 22–32)
CREATININE: 0.57 mg/dL (ref 0.44–1.00)
Chloride: 102 mmol/L (ref 101–111)
GFR calc Af Amer: >60 mL/min (ref >60)
GLUCOSE: 95 mg/dL (ref 65–99)
Potassium: 4.6 mmol/L (ref 3.5–5.1)
Sodium: 138 mmol/L (ref 135–145)

## 2016-08-18 LAB — URINE CULTURE: CULTURE: NO GROWTH

## 2016-08-18 MED ORDER — POLYETHYLENE GLYCOL 3350 17 G PO PACK
17.0000 g | PACK | Freq: Two times a day (BID) | ORAL | Status: AC
  Administered 2016-08-18 – 2016-08-19 (×4): 17 g via ORAL
  Filled 2016-08-18 (×5): qty 1

## 2016-08-18 MED ORDER — NYSTATIN 100000 UNIT/ML MT SUSP
5.0000 mL | Freq: Four times a day (QID) | OROMUCOSAL | Status: DC
  Administered 2016-08-18 – 2016-08-25 (×28): 500000 [IU] via ORAL
  Filled 2016-08-18 (×26): qty 5

## 2016-08-18 MED ORDER — FLUCONAZOLE 100 MG PO TABS
200.0000 mg | ORAL_TABLET | Freq: Every day | ORAL | Status: AC
  Administered 2016-08-18 – 2016-08-24 (×7): 200 mg via ORAL
  Filled 2016-08-18 (×7): qty 2

## 2016-08-18 NOTE — Progress Notes (Signed)
CSW received consult for SNF placement, though per chart - patient plans to return home at discharge.   No further CSW needs identified - CSW signing off.   Raynaldo Opitz, [facility] Clinical Social Worker cell #: [phone]

## 2016-08-18 NOTE — Progress Notes (Signed)
[facility] PROGRESS NOTE    Progress Note  Norma Ford  [id number] DOB: February 25, 1930 DOA: 08/15/2016 PCP: Ann Held, DO     Brief Narrative:   Norma Ford is an 82 y.o. female past medical history of morbid obesity, chronic right knee pain with history of knee replacement recently hospitalized for acute diastolic heart failure with atrial fibrillation and RVR and right great toe fracture, comes into the hospital for right lower extremity swelling and erythema juice found to have a white count 35,000 in the ED.  Assessment/Plan:   Leucocytosis likely due to Right lower extremity Cellulitis Started empirically on admission on IV Rocephin and oral narcotics. Lower extremity Doppler was negative for DVT. She has defervesced her white count is improving she is requiring less narcotics for pain. The lower extremity is still erythematous.  Thrombocytopenia: Improving likely due to infectious etiology. Lower extremity Doppler negative for DVT.  Hypertensive heart disease with chronic diastolic heart failure: Continue current regimen no changes were made.  Osteoarthrosis involving the lower leg: She is chronic knee pain for which she takes Vicodin. orthopedic surgeon was consulted recommended follow-up as an outpatient. PT evaluation recommended home health PT.  Morbid obesity ([place]) Counseling.  Paroxysmal atrial fibrillation (HCC) Control continue aspirin 325.  Acute on chronic diastolic heart failure ([place]) She seems to be euvolemic continue current regimen.  DVT prophylaxis: lovenox Family Communication:daughter Disposition Plan/Barrier to D/C Home in 1-2 days Code Status:     Code Status Orders        Start     Ordered   08/16/16 0408  Full code  Continuous     08/16/16 0407    Code Status History    Date Active Date Inactive Code Status Order ID Comments User Context   08/01/2016  2:52 AM 08/10/2016  9:28 PM Full Code [id number]  Ivor Costa,  MD ED    Advance Directive Documentation   Flowsheet Row Most Recent Value  Type of Advance Directive  Out of facility DNR (pink MOST or yellow form)  Pre-existing out of facility DNR order (yellow form or pink MOST form)  No data  "MOST" Form in Place?  No data        IV Access:    Peripheral IV   Procedures and diagnostic studies:   No results found.   Medical Consultants:    None.  Anti-Infectives:   None  Subjective:    Norma Ford she feels much better not require narcotics.  Objective:    Vitals:   08/17/16 0400 08/17/16 1300 08/17/16 2225 08/18/16 0400  BP: 107/65 115/71 99/84 115/67  Pulse: 71 79 (!) 103 80  Resp: 16 18 18 16   Temp: 98.6 F (37 C) 98.7 F (37.1 C) 98.1 F (36.7 C) 97.6 F (36.4 C)  TempSrc: Oral Oral Oral Oral  SpO2: 98% 99% 98% 99%  Weight: 128.6 kg (283 lb 8.2 oz)   128.6 kg (283 lb 8.2 oz)  Height:        Intake/Output Summary (Last 24 hours) at 08/18/16 1034 Last data filed at 08/17/16 2200  Gross per 24 hour  Intake              120 ml  Output              600 ml  Net             -480 ml   Filed Weights   08/16/16 0430 08/17/16 0400  08/18/16 0400  Weight: 128.8 kg (283 lb 15.2 oz) 128.6 kg (283 lb 8.2 oz) 128.6 kg (283 lb 8.2 oz)    Exam: General exam: In no acute distress. Respiratory system: Good air movement and clear to auscultation. Cardiovascular system: S1 & S2 heard, RRR. No JVD. Gastrointestinal system: Abdomen is nondistended, soft and nontender.  Central nervous system: Alert and oriented. No focal neurological deficits. Extremities: No pedal edema. Skin: Erythema demarcation is improved. Less tender to touch. Psychiatry: Judgement and insight appear normal. Mood & affect appropriate.    Data Reviewed:    Labs: Basic Metabolic Panel:  Recent Labs Lab 08/15/16 2326 08/17/16 0346 08/18/16 0349  NA 137 138 138  K 3.5 4.1 4.6  CL 102 101 102  CO2 29 31 32  GLUCOSE 121* 100* 95  BUN  25* 23* 24*  CREATININE 0.80 0.59 0.57  CALCIUM 8.6* 8.3* 8.5*   GFR Estimated Creatinine Clearance: 67.4 mL/min (by C-G formula based on SCr of 0.57 mg/dL). Liver Function Tests:  Recent Labs Lab 08/15/16 2326  AST 19  ALT 65*  ALKPHOS 56  BILITOT 0.5  PROT 5.8*  ALBUMIN 2.6*   No results for input(s): LIPASE, AMYLASE in the last 168 hours. No results for input(s): AMMONIA in the last 168 hours. Coagulation profile No results for input(s): INR, PROTIME in the last 168 hours.  CBC:  Recent Labs Lab 08/15/16 2326 08/16/16 0507 08/17/16 0346  WBC 35.3* 31.7* 24.2*  NEUTROABS 32.4*  --   --   HGB 11.8* 11.5* 10.9*  HCT 35.2* 33.7* 33.0*  MCV 96.2 94.1 94.8  PLT 107* 91* 103*   Cardiac Enzymes: No results for input(s): CKTOTAL, CKMB, CKMBINDEX, TROPONINI in the last 168 hours. BNP (last 3 results) No results for input(s): PROBNP in the last 8760 hours. CBG: No results for input(s): GLUCAP in the last 168 hours. D-Dimer: No results for input(s): DDIMER in the last 72 hours. Hgb A1c: No results for input(s): HGBA1C in the last 72 hours. Lipid Profile: No results for input(s): CHOL, HDL, LDLCALC, TRIG, CHOLHDL, LDLDIRECT in the last 72 hours. Thyroid function studies: No results for input(s): TSH, T4TOTAL, T3FREE, THYROIDAB in the last 72 hours.  Invalid input(s): FREET3 Anemia work up: No results for input(s): VITAMINB12, FOLATE, FERRITIN, TIBC, IRON, RETICCTPCT in the last 72 hours. Sepsis Labs:  Recent Labs Lab 08/15/16 2326 08/15/16 2331 08/16/16 0507 08/17/16 0346  WBC 35.3*  --  31.7* 24.2*  LATICACIDVEN  --  0.99  --   --    Microbiology Recent Results (from the past 240 hour(s))  Blood Culture (routine x 2)     Status: None (Preliminary result)   Collection Time: 08/15/16 11:23 PM  Result Value Ref Range Status   Specimen Description BLOOD RIGHT FOREARM  Final   Special Requests BOTTLES DRAWN AEROBIC AND ANAEROBIC 5CC  Final   Culture   Final      NO GROWTH 1 DAY Performed at [facility]    Report Status PENDING  Incomplete  Blood Culture (routine x 2)     Status: None (Preliminary result)   Collection Time: 08/16/16 12:41 AM  Result Value Ref Range Status   Specimen Description BLOOD RIGHT ANTECUBITAL  Final   Special Requests BOTTLES DRAWN AEROBIC AND ANAEROBIC 5CC EACH  Final   Culture   Final    NO GROWTH 1 DAY Performed at [facility]    Report Status PENDING  Incomplete  MRSA PCR Screening  Status: None   Collection Time: 08/16/16  5:07 AM  Result Value Ref Range Status   MRSA by PCR NEGATIVE NEGATIVE Final    Comment:        The GeneXpert MRSA Assay (FDA approved for NASAL specimens only), is one component of a comprehensive MRSA colonization surveillance program. It is not intended to diagnose MRSA infection nor to guide or monitor treatment for MRSA infections.   Urine culture     Status: None   Collection Time: 08/16/16  9:00 PM  Result Value Ref Range Status   Specimen Description URINE, CLEAN CATCH  Final   Special Requests NONE  Final   Culture NO GROWTH Performed at [facility]   Final   Report Status 08/18/2016 FINAL  Final     Medications:   . aspirin  325 mg Oral Daily  . atorvastatin  40 mg Oral q1800  . cefTRIAXone (ROCEPHIN)  IV  2 g Intravenous Q24H  . diltiazem  120 mg Oral Q12H  . enoxaparin (LOVENOX) injection  60 mg Subcutaneous Daily  . fluconazole  200 mg Oral Daily  . furosemide  40 mg Oral Daily  . mouth rinse  15 mL Mouth Rinse BID  . metoprolol  50 mg Oral BID  . multivitamin with minerals  1 tablet Oral Daily  . nystatin  5 mL Oral QID  . potassium chloride  40 mEq Oral BID  . protein supplement shake  11 oz Oral BID BM  . saccharomyces boulardii  250 mg Oral BID  . sertraline  50 mg Oral Daily   Continuous Infusions:   Time spent: 25 min   LOS: 2 days   Charlynne Cousins  [facility] Pager [phone]  *Please refer to  [email or web address], password [email or web address] to get updated schedule on who will round on this patient, as hospitalists switch teams weekly. If 7PM-7AM, please contact night-coverage at [email or web address], password [email or web address] for any overnight needs.  08/18/2016, 10:34 AM

## 2016-08-18 NOTE — Consult Note (Signed)
   [facility] Inpatient Consult   08/18/2016  Norma Ford Feb 04, 1930 [id number]    [facility] Care Management follow up. Spoke with inpatient RNCM. She reports the plan is return home at discharge. [facility] Care Management to follow up.    Marthenia Rolling, MSN-Ed, RN,BSN [facility] Liaison [phone]

## 2016-08-19 ENCOUNTER — Ambulatory Visit: Payer: Medicare Other | Admitting: Family Medicine

## 2016-08-19 LAB — BASIC METABOLIC PANEL
Anion gap: 7 (ref 5–15)
BUN: 22 mg/dL — AB (ref 6–20)
CALCIUM: 8.5 mg/dL — AB (ref 8.9–10.3)
CO2: 29 mmol/L (ref 22–32)
CREATININE: 0.54 mg/dL (ref 0.44–1.00)
Chloride: 102 mmol/L (ref 101–111)
GFR calc Af Amer: >60 mL/min (ref >60)
GLUCOSE: 76 mg/dL (ref 65–99)
Potassium: 5.1 mmol/L (ref 3.5–5.1)
Sodium: 138 mmol/L (ref 135–145)

## 2016-08-19 LAB — GLUCOSE, CAPILLARY: Glucose-Capillary: 76 mg/dL (ref 65–99)

## 2016-08-19 MED ORDER — MORPHINE SULFATE (PF) 2 MG/ML IV SOLN
2.0000 mg | INTRAVENOUS | Status: DC | PRN
  Administered 2016-08-20 – 2016-08-21 (×6): 2 mg via INTRAVENOUS
  Filled 2016-08-19 (×6): qty 1

## 2016-08-19 MED ORDER — OXYCODONE-ACETAMINOPHEN 5-325 MG PO TABS
1.0000 | ORAL_TABLET | ORAL | Status: DC | PRN
  Administered 2016-08-19 – 2016-08-20 (×3): 2 via ORAL
  Filled 2016-08-19 (×3): qty 2

## 2016-08-19 MED ORDER — VANCOMYCIN HCL 10 G IV SOLR
1500.0000 mg | INTRAVENOUS | Status: DC
  Administered 2016-08-20 – 2016-08-22 (×3): 1500 mg via INTRAVENOUS
  Filled 2016-08-19 (×4): qty 1500

## 2016-08-19 MED ORDER — SODIUM CHLORIDE 0.9 % IV SOLN
2000.0000 mg | Freq: Once | INTRAVENOUS | Status: AC
  Administered 2016-08-19: 2000 mg via INTRAVENOUS
  Filled 2016-08-19: qty 2000

## 2016-08-19 NOTE — Progress Notes (Signed)
Nutrition Follow-up  DOCUMENTATION CODES:   Morbid obesity  INTERVENTION:  Continue Premier Protein BID, each supplement provides 160 kcal and 30 grams protein.  Will order Magic Cup TID, each supplement provides 290 kcal and 9 grams protein.  Continue encouraging PO intake of meals and supplements.  Continue daily multivitamin with minerals.   NUTRITION DIAGNOSIS:   Inadequate oral intake related to poor appetite as evidenced by per patient/family report.  Ongoing.  GOAL:   Patient will meet greater than or equal to 90% of their needs  Not met.  MONITOR:   PO intake, Supplement acceptance, Weight trends, Labs, Skin, I & O's  REASON FOR ASSESSMENT:   Malnutrition Screening Tool    ASSESSMENT:   82 y.o. female with a past medical history significant for HTN, chronic right knee pain with knee replacement and revision, Morbid obesity, and remote mycosis fungoides who presents with right leg pain and redness and fever.  Patient reports her appetite is not good and she is experiencing early satiety. She has put herself on a soft diet because she cannot use her dentures right now. Denies N/V. She did not order lunch or dinner yesterday, but she did finish all of her yogurt this morning for breakfast. She does not want any meat or beans right now, even if soft. She reports drinking Premier Protein BID as ordered. Amenable to trying Magic Cup.   Weight trend: CBW 280 lbs (127.4 kg) is -1.4 kg since admission. Patient has been on Lasix daily.   Meal Completion: 0-100% per chart and patient. In the past 24 hrs patient has had 872 kcal (62% minimum estimated kcal needs) and 69 grams protein (92% minimum estimated protein needs).   Medications reviewed and include: Lasix 40 mg daily, multivitamin with minerals daily, Miralax 17 grams BID.  Labs reviewed: CBG 76, BUN 22.    Diet Order:  DIET SOFT Room service appropriate? Yes; Fluid consistency: Thin  Skin:  Reviewed, no  issues  Last BM:  08/16/2016  Height:   Ht Readings from Last 1 Encounters:  08/16/16 '5\' 2"'$  (1.575 m)    Weight:   Wt Readings from Last 1 Encounters:  08/19/16 280 lb 13.9 oz (127.4 kg)    Ideal Body Weight:  50 kg  BMI:  Body mass index is 51.37 kg/m.  Estimated Nutritional Needs:   Kcal:  6728-9791 (11-13 kcal/kg)  Protein:  75-85 grams (1.5-1.7 grams/kg IBW)  Fluid:  1.2-1.5 L/day  EDUCATION NEEDS:   No education needs identified at this time  Willey Blade, MS, RD, LDN Pager: [phone] After Hours Pager: [phone]

## 2016-08-19 NOTE — Progress Notes (Addendum)
Pharmacy Antibiotic Follow-up Note  Norma Ford is a 82 y.o. year-old female admitted on 08/15/2016.  The patient is currently on day 4 of Rocephin & day 1 of Vancomycin for RLE cellulitis. - Redness and swelling worsened today, marking margins daily. Add Vancomycin  Assessment/Plan: Vancomycin 2gm x1, then 1500mg  q24, goal trough 10-15 mcg/ml  Temp (24hrs), Avg:98.4 F (36.9 C), Min:97.7 F (36.5 C), Max:99 F (37.2 C)   Recent Labs Lab 08/15/16 2326 08/16/16 0507 08/17/16 0346  WBC 35.3* 31.7* 24.2*    Recent Labs Lab 08/15/16 2326 08/17/16 0346 08/18/16 0349 08/19/16 0525  CREATININE 0.80 0.59 0.57 0.54   Estimated Creatinine Clearance: 66.9 mL/min (by C-G formula based on SCr of 0.54 mg/dL).    Allergies  Allergen Reactions  . Codeine     REACTION: ITCHING    Antimicrobials this admission: 10/10 zosyn >> 10/10 10/10 vancomycin >> 10/10 10/10 rocephin >> PTA (10/6) fluconazole >> (10/18) 10/13 Vancomycin >>  Levels/dose changes this admission:  Microbiology results: 10/9 BCx: NGTD 10/10 UCx: NG-final 10/10 MRSA PCR: negative  Thank you for allowing pharmacy to be a part of this patient's care.  Minda Ditto PharmD Pager [phone] 08/19/2016, 11:06 AM

## 2016-08-19 NOTE — Progress Notes (Signed)
PT Cancellation Note  Patient Details Name: Norma Ford MRN: [id number] DOB: February 11, 1930   Cancelled Treatment:    Reason Eval/Treat Not Completed: Medical issues which prohibited therapy;Pain limiting ability to participate  Pt reports symptoms of R LE cellulitis worse and increased pain today.  Currently 6/10 and RN notified.   [name],KATHrine E 08/19/2016, 2:46 PM Carmelia Bake, PT, DPT 08/19/2016 Pager: [phone]

## 2016-08-19 NOTE — Progress Notes (Signed)
[facility] PROGRESS NOTE    Progress Note  [name]  [id number] DOB: 1930-06-15 DOA: 08/15/2016 PCP: Ann Held, DO     Brief Narrative:   Norma Ford is an 82 y.o. female past medical history of morbid obesity, chronic right knee pain with history of knee replacement recently hospitalized for acute diastolic heart failure with atrial fibrillation and RVR and right great toe fracture, comes into the hospital for right lower extremity swelling and erythema juice found to have a white count 35,000 in the ED.  Assessment/Plan:   Leucocytosis likely due to Right lower extremity Cellulitis Started empirically on admission on IV Rocephin Erythema has progress will add IV vancomycin Lower extremity Doppler was negative for DVT. She has remained afebrile white count. The leg is more erythematous  Thrombocytopenia: Improving likely due to infectious etiology. Lower extremity Doppler negative for DVT.  Hypertensive heart disease with chronic diastolic heart failure: Continue current regimen no changes were made.  Osteoarthrosis involving the lower leg: She has chronic knee pain for which she takes Vicodin and requires more pain control due to her cellulitis. Orthopedic surgeon was consulted recommended follow-up as an outpatient. PT evaluation recommended home health PT.  Morbid obesity ([place]) Counseling.  Paroxysmal atrial fibrillation (HCC) Control continue aspirin 325.  Acute on chronic diastolic heart failure ([place]) She seems to be euvolemic continue current regimen.  DVT prophylaxis: lovenox Family Communication:daughter Disposition Plan/Barrier to D/C Home in 1-2 days Code Status:     Code Status Orders        Start     Ordered   08/16/16 0408  Full code  Continuous     08/16/16 0407    Code Status History    Date Active Date Inactive Code Status Order ID Comments User Context   08/01/2016  2:52 AM 08/10/2016  9:28 PM Full Code [id number]   Ivor Costa, MD ED    Advance Directive Documentation   Flowsheet Row Most Recent Value  Type of Advance Directive  Out of facility DNR (pink MOST or yellow form)  Pre-existing out of facility DNR order (yellow form or pink MOST form)  No data  "MOST" Form in Place?  No data        IV Access:    Peripheral IV   Procedures and diagnostic studies:   No results found.   Medical Consultants:    None.  Anti-Infectives:   None  Subjective:    Fran Lowes she feels much better not require narcotics.  Objective:    Vitals:   08/18/16 1245 08/18/16 1500 08/18/16 2129 08/19/16 0541  BP:  127/75 114/72 119/73  Pulse:  84 (!) 104 99  Resp:  18 18 18   Temp:  97.7 F (36.5 C) 98.6 F (37 C) 99 F (37.2 C)  TempSrc:  Oral Oral Oral  SpO2: 93% 92% 98% 92%  Weight:    127.4 kg (280 lb 13.9 oz)  Height:        Intake/Output Summary (Last 24 hours) at 08/19/16 1053 Last data filed at 08/18/16 2244  Gross per 24 hour  Intake              480 ml  Output                0 ml  Net              480 ml   Filed Weights   08/17/16 0400 08/18/16 0400 08/19/16 0541  Weight: 128.6 kg (283 lb 8.2 oz) 128.6 kg (283 lb 8.2 oz) 127.4 kg (280 lb 13.9 oz)    Exam: General exam: In no acute distress. Respiratory system: Good air movement and clear to auscultation. Cardiovascular system: S1 & S2 heard, RRR. No JVD. Gastrointestinal system: Abdomen is nondistended, soft and nontender.  Central nervous system: Alert and oriented. No focal neurological deficits. Extremities: No pedal edema. Skin: Erythema has worsen and spread beyond the demarcation. Psychiatry: Judgement and insight appear normal. Mood & affect appropriate.    Data Reviewed:    Labs: Basic Metabolic Panel:  Recent Labs Lab 08/15/16 2326 08/17/16 0346 08/18/16 0349 08/19/16 0525  NA 137 138 138 138  K 3.5 4.1 4.6 5.1  CL 102 101 102 102  CO2 29 31 32 29  GLUCOSE 121* 100* 95 76  BUN 25* 23* 24*  22*  CREATININE 0.80 0.59 0.57 0.54  CALCIUM 8.6* 8.3* 8.5* 8.5*   GFR Estimated Creatinine Clearance: 66.9 mL/min (by C-G formula based on SCr of 0.54 mg/dL). Liver Function Tests:  Recent Labs Lab 08/15/16 2326  AST 19  ALT 65*  ALKPHOS 56  BILITOT 0.5  PROT 5.8*  ALBUMIN 2.6*   No results for input(s): LIPASE, AMYLASE in the last 168 hours. No results for input(s): AMMONIA in the last 168 hours. Coagulation profile No results for input(s): INR, PROTIME in the last 168 hours.  CBC:  Recent Labs Lab 08/15/16 2326 08/16/16 0507 08/17/16 0346  WBC 35.3* 31.7* 24.2*  NEUTROABS 32.4*  --   --   HGB 11.8* 11.5* 10.9*  HCT 35.2* 33.7* 33.0*  MCV 96.2 94.1 94.8  PLT 107* 91* 103*   Cardiac Enzymes: No results for input(s): CKTOTAL, CKMB, CKMBINDEX, TROPONINI in the last 168 hours. BNP (last 3 results) No results for input(s): PROBNP in the last 8760 hours. CBG:  Recent Labs Lab 08/19/16 0714  GLUCAP 76   D-Dimer: No results for input(s): DDIMER in the last 72 hours. Hgb A1c: No results for input(s): HGBA1C in the last 72 hours. Lipid Profile: No results for input(s): CHOL, HDL, LDLCALC, TRIG, CHOLHDL, LDLDIRECT in the last 72 hours. Thyroid function studies: No results for input(s): TSH, T4TOTAL, T3FREE, THYROIDAB in the last 72 hours.  Invalid input(s): FREET3 Anemia work up: No results for input(s): VITAMINB12, FOLATE, FERRITIN, TIBC, IRON, RETICCTPCT in the last 72 hours. Sepsis Labs:  Recent Labs Lab 08/15/16 2326 08/15/16 2331 08/16/16 0507 08/17/16 0346  WBC 35.3*  --  31.7* 24.2*  LATICACIDVEN  --  0.99  --   --    Microbiology Recent Results (from the past 240 hour(s))  Blood Culture (routine x 2)     Status: None (Preliminary result)   Collection Time: 08/15/16 11:23 PM  Result Value Ref Range Status   Specimen Description BLOOD RIGHT FOREARM  Final   Special Requests BOTTLES DRAWN AEROBIC AND ANAEROBIC 5CC  Final   Culture   Final     NO GROWTH 2 DAYS Performed at [facility]    Report Status PENDING  Incomplete  Blood Culture (routine x 2)     Status: None (Preliminary result)   Collection Time: 08/16/16 12:41 AM  Result Value Ref Range Status   Specimen Description BLOOD RIGHT ANTECUBITAL  Final   Special Requests BOTTLES DRAWN AEROBIC AND ANAEROBIC 5CC EACH  Final   Culture   Final    NO GROWTH 2 DAYS Performed at [facility]    Report Status  PENDING  Incomplete  MRSA PCR Screening     Status: None   Collection Time: 08/16/16  5:07 AM  Result Value Ref Range Status   MRSA by PCR NEGATIVE NEGATIVE Final    Comment:        The GeneXpert MRSA Assay (FDA approved for NASAL specimens only), is one component of a comprehensive MRSA colonization surveillance program. It is not intended to diagnose MRSA infection nor to guide or monitor treatment for MRSA infections.   Urine culture     Status: None   Collection Time: 08/16/16  9:00 PM  Result Value Ref Range Status   Specimen Description URINE, CLEAN CATCH  Final   Special Requests NONE  Final   Culture NO GROWTH Performed at [facility]   Final   Report Status 08/18/2016 FINAL  Final     Medications:   . aspirin  325 mg Oral Daily  . atorvastatin  40 mg Oral q1800  . cefTRIAXone (ROCEPHIN)  IV  2 g Intravenous Q24H  . diltiazem  120 mg Oral Q12H  . enoxaparin (LOVENOX) injection  60 mg Subcutaneous Daily  . fluconazole  200 mg Oral Daily  . furosemide  40 mg Oral Daily  . mouth rinse  15 mL Mouth Rinse BID  . metoprolol  50 mg Oral BID  . multivitamin with minerals  1 tablet Oral Daily  . nystatin  5 mL Oral QID  . polyethylene glycol  17 g Oral BID  . protein supplement shake  11 oz Oral BID BM  . saccharomyces boulardii  250 mg Oral BID  . sertraline  50 mg Oral Daily  . vancomycin  2,000 mg Intravenous Once   Continuous Infusions:   Time spent: 25 min   LOS: 3 days   Charlynne Cousins  [facility] Pager [phone]  *Please refer to [email or web address], password [email or web address] to get updated schedule on who will round on this patient, as hospitalists switch teams weekly. If 7PM-7AM, please contact night-coverage at [email or web address], password [email or web address] for any overnight needs.  08/19/2016, 10:53 AM

## 2016-08-20 LAB — CBC
HEMATOCRIT: 35.2 % — AB (ref 36.0–46.0)
Hemoglobin: 11.5 g/dL — ABNORMAL LOW (ref 12.0–15.0)
MCH: 32.3 pg (ref 26.0–34.0)
MCHC: 32.7 g/dL (ref 30.0–36.0)
MCV: 98.9 fL (ref 78.0–100.0)
Platelets: 139 10*3/uL — ABNORMAL LOW (ref 150–400)
RBC: 3.56 MIL/uL — ABNORMAL LOW (ref 3.87–5.11)
RDW: 15.1 % (ref 11.5–15.5)
WBC: 14.8 10*3/uL — ABNORMAL HIGH (ref 4.0–10.5)

## 2016-08-20 MED ORDER — POLYETHYLENE GLYCOL 3350 17 G PO PACK
17.0000 g | PACK | Freq: Two times a day (BID) | ORAL | Status: DC
  Administered 2016-08-20 – 2016-08-26 (×6): 17 g via ORAL
  Filled 2016-08-20 (×10): qty 1

## 2016-08-20 MED ORDER — HYDROXYZINE HCL 10 MG PO TABS
10.0000 mg | ORAL_TABLET | ORAL | Status: AC
  Administered 2016-08-20: 10 mg via ORAL
  Filled 2016-08-20: qty 1

## 2016-08-20 MED ORDER — OXYCODONE HCL 5 MG PO TABS
10.0000 mg | ORAL_TABLET | ORAL | Status: DC | PRN
  Administered 2016-08-20 – 2016-08-21 (×6): 10 mg via ORAL
  Filled 2016-08-20 (×6): qty 2

## 2016-08-20 MED ORDER — OXYCODONE-ACETAMINOPHEN 7.5-325 MG PO TABS: 2.0000 | ORAL_TABLET | ORAL | Status: DC | PRN

## 2016-08-20 NOTE — Progress Notes (Signed)
[facility] PROGRESS NOTE    Progress Note  Norma Ford  [id number] DOB: 1930-05-11 DOA: 08/15/2016 PCP: Ann Held, DO     Brief Narrative:   Norma Ford is an 82 y.o. female past medical history of morbid obesity, chronic right knee pain with history of knee replacement recently hospitalized for acute diastolic heart failure with atrial fibrillation and RVR and right great toe fracture, comes into the hospital for right lower extremity swelling and erythema juice found to have a white count 35,000 in the ED.  Assessment/Plan:   Leucocytosis likely due to Right lower extremity Cellulitis Started empirically on admission on IV Rocephin, erythema has worsen. Antibiotic coverage brought into [place] and Rocephin. She continues to remain afebrile with no leukocytosis. She relates her pain is not controlled to increase her narcotics. Erythema has not improve compared to 08/19/2016.  Thrombocytopenia: Improving likely due to infectious etiology. Lower extremity Doppler negative for DVT.  Hypertensive heart disease with chronic diastolic heart failure: Continue current regimen no changes were made.  Osteoarthrosis involving the lower leg: She is chronic knee pain for which she takes Vicodin. orthopedic surgeon was consulted recommended follow-up as an outpatient. PT evaluation recommended home health PT.  Morbid obesity ([place]) Counseling.  Paroxysmal atrial fibrillation (HCC) Control continue aspirin 325.  Acute on chronic diastolic heart failure ([place]) She seems to be euvolemic continue current regimen.  Oral thrush: Cont diflucan and nystatin, much improved now, no further white plaques.   DVT prophylaxis: lovenox Family Communication:daughter Disposition Plan/Barrier to D/C Home in 1-2 days Code Status:     Code Status Orders        Start     Ordered   08/16/16 0408  Full code  Continuous     08/16/16 0407    Code Status History    Date  Active Date Inactive Code Status Order ID Comments User Context   08/01/2016  2:52 AM 08/10/2016  9:28 PM Full Code [id number]  Ivor Costa, MD ED    Advance Directive Documentation   Flowsheet Row Most Recent Value  Type of Advance Directive  Out of facility DNR (pink MOST or yellow form)  Pre-existing out of facility DNR order (yellow form or pink MOST form)  No data  "MOST" Form in Place?  No data        IV Access:    Peripheral IV   Procedures and diagnostic studies:   No results found.   Medical Consultants:    None.  Anti-Infectives:   Vancomycin and Rocephin and Diflucan  Subjective:    Norma Ford she relates the pain has been unbearable.  Objective:    Vitals:   08/19/16 1542 08/19/16 2128 08/19/16 2202 08/20/16 0552  BP: (!) 126/54 122/67  104/61  Pulse: 85 94  95  Resp:  18    Temp:   97.4 F (36.3 C) 99.1 F (37.3 C)  TempSrc:   Oral Oral  SpO2:  95%  95%  Weight:    129.2 kg (284 lb 13.4 oz)  Height:        Intake/Output Summary (Last 24 hours) at 08/20/16 1010 Last data filed at 08/19/16 1854  Gross per 24 hour  Intake              360 ml  Output                0 ml  Net  360 ml   Filed Weights   08/18/16 0400 08/19/16 0541 08/20/16 0552  Weight: 128.6 kg (283 lb 8.2 oz) 127.4 kg (280 lb 13.9 oz) 129.2 kg (284 lb 13.4 oz)    Exam: General exam: In no acute distress. Respiratory system: Good air movement and clear to auscultation. Cardiovascular system: S1 & S2 heard, RRR. No JVD. Gastrointestinal system: Abdomen is nondistended, soft and nontender.  Central nervous system: Alert and oriented. No focal neurological deficits. Extremities: No pedal edema. Skin: Erythema demarcation has not improve. Psychiatry: Judgement and insight appear normal. Mood & affect appropriate.    Data Reviewed:    Labs: Basic Metabolic Panel:  Recent Labs Lab 08/15/16 2326 08/17/16 0346 08/18/16 0349 08/19/16 0525  NA 137 138  138 138  K 3.5 4.1 4.6 5.1  CL 102 101 102 102  CO2 29 31 32 29  GLUCOSE 121* 100* 95 76  BUN 25* 23* 24* 22*  CREATININE 0.80 0.59 0.57 0.54  CALCIUM 8.6* 8.3* 8.5* 8.5*   GFR Estimated Creatinine Clearance: 67.5 mL/min (by C-G formula based on SCr of 0.54 mg/dL). Liver Function Tests:  Recent Labs Lab 08/15/16 2326  AST 19  ALT 65*  ALKPHOS 56  BILITOT 0.5  PROT 5.8*  ALBUMIN 2.6*   No results for input(s): LIPASE, AMYLASE in the last 168 hours. No results for input(s): AMMONIA in the last 168 hours. Coagulation profile No results for input(s): INR, PROTIME in the last 168 hours.  CBC:  Recent Labs Lab 08/15/16 2326 08/16/16 0507 08/17/16 0346  WBC 35.3* 31.7* 24.2*  NEUTROABS 32.4*  --   --   HGB 11.8* 11.5* 10.9*  HCT 35.2* 33.7* 33.0*  MCV 96.2 94.1 94.8  PLT 107* 91* 103*   Cardiac Enzymes: No results for input(s): CKTOTAL, CKMB, CKMBINDEX, TROPONINI in the last 168 hours. BNP (last 3 results) No results for input(s): PROBNP in the last 8760 hours. CBG:  Recent Labs Lab 08/19/16 0714  GLUCAP 76   D-Dimer: No results for input(s): DDIMER in the last 72 hours. Hgb A1c: No results for input(s): HGBA1C in the last 72 hours. Lipid Profile: No results for input(s): CHOL, HDL, LDLCALC, TRIG, CHOLHDL, LDLDIRECT in the last 72 hours. Thyroid function studies: No results for input(s): TSH, T4TOTAL, T3FREE, THYROIDAB in the last 72 hours.  Invalid input(s): FREET3 Anemia work up: No results for input(s): VITAMINB12, FOLATE, FERRITIN, TIBC, IRON, RETICCTPCT in the last 72 hours. Sepsis Labs:  Recent Labs Lab 08/15/16 2326 08/15/16 2331 08/16/16 0507 08/17/16 0346  WBC 35.3*  --  31.7* 24.2*  LATICACIDVEN  --  0.99  --   --    Microbiology Recent Results (from the past 240 hour(s))  Blood Culture (routine x 2)     Status: None (Preliminary result)   Collection Time: 08/15/16 11:23 PM  Result Value Ref Range Status   Specimen Description BLOOD  RIGHT FOREARM  Final   Special Requests BOTTLES DRAWN AEROBIC AND ANAEROBIC 5CC  Final   Culture   Final    NO GROWTH 3 DAYS Performed at [facility]    Report Status PENDING  Incomplete  Blood Culture (routine x 2)     Status: None (Preliminary result)   Collection Time: 08/16/16 12:41 AM  Result Value Ref Range Status   Specimen Description BLOOD RIGHT ANTECUBITAL  Final   Special Requests BOTTLES DRAWN AEROBIC AND ANAEROBIC 5CC EACH  Final   Culture   Final    NO GROWTH  3 DAYS Performed at [facility]    Report Status PENDING  Incomplete  MRSA PCR Screening     Status: None   Collection Time: 08/16/16  5:07 AM  Result Value Ref Range Status   MRSA by PCR NEGATIVE NEGATIVE Final    Comment:        The GeneXpert MRSA Assay (FDA approved for NASAL specimens only), is one component of a comprehensive MRSA colonization surveillance program. It is not intended to diagnose MRSA infection nor to guide or monitor treatment for MRSA infections.   Urine culture     Status: None   Collection Time: 08/16/16  9:00 PM  Result Value Ref Range Status   Specimen Description URINE, CLEAN CATCH  Final   Special Requests NONE  Final   Culture NO GROWTH Performed at [facility]   Final   Report Status 08/18/2016 FINAL  Final     Medications:   . aspirin  325 mg Oral Daily  . atorvastatin  40 mg Oral q1800  . cefTRIAXone (ROCEPHIN)  IV  2 g Intravenous Q24H  . diltiazem  120 mg Oral Q12H  . enoxaparin (LOVENOX) injection  60 mg Subcutaneous Daily  . fluconazole  200 mg Oral Daily  . furosemide  40 mg Oral Daily  . mouth rinse  15 mL Mouth Rinse BID  . metoprolol  50 mg Oral BID  . multivitamin with minerals  1 tablet Oral Daily  . nystatin  5 mL Oral QID  . polyethylene glycol  17 g Oral BID  . protein supplement shake  11 oz Oral BID BM  . saccharomyces boulardii  250 mg Oral BID  . sertraline  50 mg Oral Daily  . vancomycin  1,500 mg Intravenous Q24H    Continuous Infusions:   Time spent: 25 min   LOS: 4 days   Charlynne Cousins  [facility] Pager [phone]  *Please refer to [email or web address], password [email or web address] to get updated schedule on who will round on this patient, as hospitalists switch teams weekly. If 7PM-7AM, please contact night-coverage at [email or web address], password [email or web address] for any overnight needs.  08/20/2016, 10:10 AM

## 2016-08-21 LAB — CULTURE, BLOOD (ROUTINE X 2)
Culture: NO GROWTH
Culture: NO GROWTH

## 2016-08-21 MED ORDER — SODIUM CHLORIDE 0.9% FLUSH: 9.0000 mL | INTRAVENOUS | Status: DC | PRN

## 2016-08-21 MED ORDER — ONDANSETRON HCL 4 MG/2ML IJ SOLN: 4.0000 mg | Freq: Four times a day (QID) | INTRAMUSCULAR | Status: DC | PRN

## 2016-08-21 MED ORDER — DIPHENHYDRAMINE HCL 50 MG/ML IJ SOLN: 12.5000 mg | Freq: Four times a day (QID) | INTRAMUSCULAR | Status: DC | PRN

## 2016-08-21 MED ORDER — HYDROXYZINE HCL 10 MG PO TABS
10.0000 mg | ORAL_TABLET | Freq: Once | ORAL | Status: AC
  Administered 2016-08-21: 10 mg via ORAL
  Filled 2016-08-21: qty 1

## 2016-08-21 MED ORDER — MORPHINE SULFATE (PF) 2 MG/ML IV SOLN: 4.0000 mg | INTRAVENOUS | Status: DC | PRN

## 2016-08-21 MED ORDER — MORPHINE SULFATE 2 MG/ML IV SOLN
INTRAVENOUS | Status: DC
  Administered 2016-08-21: 1.5 mg via INTRAVENOUS
  Administered 2016-08-21: 3 mg via INTRAVENOUS
  Administered 2016-08-21: 12 mg via INTRAVENOUS
  Administered 2016-08-22: 7.5 mg via INTRAVENOUS
  Administered 2016-08-22: 2 mg via INTRAVENOUS
  Administered 2016-08-22: 12 mg via INTRAVENOUS
  Administered 2016-08-22: 1.5 mg via INTRAVENOUS
  Administered 2016-08-22: 3 mg via INTRAVENOUS
  Filled 2016-08-21 (×2): qty 25

## 2016-08-21 MED ORDER — OXYCODONE HCL 5 MG PO TABS: 15.0000 mg | ORAL_TABLET | ORAL | Status: DC | PRN

## 2016-08-21 MED ORDER — NALOXONE HCL 0.4 MG/ML IJ SOLN
0.4000 mg | INTRAMUSCULAR | Status: DC | PRN
Start: 2016-08-21 — End: 2016-08-22

## 2016-08-21 MED ORDER — SORBITOL 70 % SOLN
960.0000 mL | TOPICAL_OIL | Freq: Once | ORAL | Status: AC
  Administered 2016-08-21: 960 mL via RECTAL
  Filled 2016-08-21: qty 240

## 2016-08-21 MED ORDER — DIPHENHYDRAMINE HCL 12.5 MG/5ML PO ELIX: 12.5000 mg | ORAL_SOLUTION | Freq: Four times a day (QID) | ORAL | Status: DC | PRN

## 2016-08-21 MED ORDER — INFLUENZA VAC SPLIT QUAD 0.5 ML IM SUSY: 0.5000 mL | PREFILLED_SYRINGE | INTRAMUSCULAR | Status: DC

## 2016-08-21 NOTE — Progress Notes (Signed)
Consult received.  Met briefly with patient and her family.  Her pain is better controlled on PCA.  She would like to wait until tomorrow to discuss goals.  She is confirm she in DNR/DNI.  I updated this in the computer.  Consult to follow tomorrow per patient request.  Micheline Rough, MD [place] Team [phone]

## 2016-08-21 NOTE — Progress Notes (Addendum)
[facility] PROGRESS NOTE    Progress Note  Norma Ford  [id number] DOB: [birth year] DOA: 08/15/2016 PCP: Ann Held, DO     Brief Narrative:   Norma Ford is an 82 y.o. female past medical history of morbid obesity, chronic right knee pain with history of knee replacement recently hospitalized for acute diastolic heart failure with atrial fibrillation and RVR and right great toe fracture, comes into the hospital for right lower extremity swelling and erythema juice found to have a white count 35,000 in the ED.  Assessment/Plan:   Leucocytosis likely due to Right lower extremity Cellulitis She has remained afebrile leukocytosis continues to improve continue IV Rocephin and vancomycin. She relates her pain is not controlled,  increase her narcotics. Erythema has not improve compared to 08/19/2016. For pain control started on a PCA pump.  Thrombocytopenia: Improving likely due to infectious etiology. Lower extremity Doppler negative for DVT.  Hypertensive heart disease with chronic diastolic heart failure: Continue current regimen no changes were made.  Osteoarthrosis involving the lower leg: She is chronic knee pain for which she takes Vicodin. orthopedic surgeon was consulted recommended follow-up as an outpatient. PT evaluation recommended home health PT.  Morbid obesity ([place]) Counseling.  Paroxysmal atrial fibrillation (HCC) Control continue aspirin 325.  Acute on chronic diastolic heart failure ([place]) She seems to be euvolemic continue current regimen.  Oral thrush: Cont diflucan and nystatin, much improved now, no further white plaques.   DVT prophylaxis: lovenox Family Communication:daughter Disposition Plan/Barrier to D/C Home in 2 days Code Status:     Code Status Orders        Start     Ordered   08/16/16 0408  Full code  Continuous     08/16/16 0407    Code Status History    Date Active Date Inactive Code Status Order ID  Comments User Context   08/01/2016  2:52 AM 08/10/2016  9:28 PM Full Code [id number]  Ivor Costa, MD ED    Advance Directive Documentation   Flowsheet Row Most Recent Value  Type of Advance Directive  Out of facility DNR (pink MOST or yellow form)  Pre-existing out of facility DNR order (yellow form or pink MOST form)  No data  "MOST" Form in Place?  No data        IV Access:    Peripheral IV   Procedures and diagnostic studies:   No results found.   Medical Consultants:    None.  Anti-Infectives:   Vancomycin and Rocephin and Diflucan  Subjective:    DORISA BRISBANE pain is not improved.  Objective:    Vitals:   08/20/16 1315 08/20/16 2117 08/21/16 0215 08/21/16 1117  BP: 106/64 118/66 (!) 146/60   Pulse: 81 92 77   Resp:   18 16  Temp: 97.6 F (36.4 C) 98.6 F (37 C)    TempSrc: Oral Oral Oral   SpO2: 92% 92% 98% 94%  Weight:      Height:       No intake or output data in the 24 hours ending 08/21/16 1150 Filed Weights   08/18/16 0400 08/19/16 0541 08/20/16 0552  Weight: 128.6 kg (283 lb 8.2 oz) 127.4 kg (280 lb 13.9 oz) 129.2 kg (284 lb 13.4 oz)    Exam: General exam: In no acute distress. Respiratory system: Good air movement and clear to auscultation. Cardiovascular system: S1 & S2 heard, RRR. No JVD. Gastrointestinal system: Abdomen is nondistended, soft and nontender.  Central nervous system: Alert and oriented. No focal neurological deficits. Extremities: No pedal edema. Skin: Erythema demarcation has not improve. Psychiatry: Judgement and insight appear normal. Mood & affect appropriate.    Data Reviewed:    Labs: Basic Metabolic Panel:  Recent Labs Lab 08/15/16 2326 08/17/16 0346 08/18/16 0349 08/19/16 0525  NA 137 138 138 138  K 3.5 4.1 4.6 5.1  CL 102 101 102 102  CO2 29 31 32 29  GLUCOSE 121* 100* 95 76  BUN 25* 23* 24* 22*  CREATININE 0.80 0.59 0.57 0.54  CALCIUM 8.6* 8.3* 8.5* 8.5*   GFR Estimated Creatinine  Clearance: 67.5 mL/min (by C-G formula based on SCr of 0.54 mg/dL). Liver Function Tests:  Recent Labs Lab 08/15/16 2326  AST 19  ALT 65*  ALKPHOS 56  BILITOT 0.5  PROT 5.8*  ALBUMIN 2.6*   No results for input(s): LIPASE, AMYLASE in the last 168 hours. No results for input(s): AMMONIA in the last 168 hours. Coagulation profile No results for input(s): INR, PROTIME in the last 168 hours.  CBC:  Recent Labs Lab 08/15/16 2326 08/16/16 0507 08/17/16 0346 08/20/16 1310  WBC 35.3* 31.7* 24.2* 14.8*  NEUTROABS 32.4*  --   --   --   HGB 11.8* 11.5* 10.9* 11.5*  HCT 35.2* 33.7* 33.0* 35.2*  MCV 96.2 94.1 94.8 98.9  PLT 107* 91* 103* 139*   Cardiac Enzymes: No results for input(s): CKTOTAL, CKMB, CKMBINDEX, TROPONINI in the last 168 hours. BNP (last 3 results) No results for input(s): PROBNP in the last 8760 hours. CBG:  Recent Labs Lab 08/19/16 0714  GLUCAP 76   D-Dimer: No results for input(s): DDIMER in the last 72 hours. Hgb A1c: No results for input(s): HGBA1C in the last 72 hours. Lipid Profile: No results for input(s): CHOL, HDL, LDLCALC, TRIG, CHOLHDL, LDLDIRECT in the last 72 hours. Thyroid function studies: No results for input(s): TSH, T4TOTAL, T3FREE, THYROIDAB in the last 72 hours.  Invalid input(s): FREET3 Anemia work up: No results for input(s): VITAMINB12, FOLATE, FERRITIN, TIBC, IRON, RETICCTPCT in the last 72 hours. Sepsis Labs:  Recent Labs Lab 08/15/16 2326 08/15/16 2331 08/16/16 0507 08/17/16 0346 08/20/16 1310  WBC 35.3*  --  31.7* 24.2* 14.8*  LATICACIDVEN  --  0.99  --   --   --    Microbiology Recent Results (from the past 240 hour(s))  Blood Culture (routine x 2)     Status: None (Preliminary result)   Collection Time: 08/15/16 11:23 PM  Result Value Ref Range Status   Specimen Description BLOOD RIGHT FOREARM  Final   Special Requests BOTTLES DRAWN AEROBIC AND ANAEROBIC 5CC  Final   Culture   Final    NO GROWTH 4  DAYS Performed at [facility]    Report Status PENDING  Incomplete  Blood Culture (routine x 2)     Status: None (Preliminary result)   Collection Time: 08/16/16 12:41 AM  Result Value Ref Range Status   Specimen Description BLOOD RIGHT ANTECUBITAL  Final   Special Requests BOTTLES DRAWN AEROBIC AND ANAEROBIC 5CC EACH  Final   Culture   Final    NO GROWTH 4 DAYS Performed at [facility]    Report Status PENDING  Incomplete  MRSA PCR Screening     Status: None   Collection Time: 08/16/16  5:07 AM  Result Value Ref Range Status   MRSA by PCR NEGATIVE NEGATIVE Final    Comment:  The GeneXpert MRSA Assay (FDA approved for NASAL specimens only), is one component of a comprehensive MRSA colonization surveillance program. It is not intended to diagnose MRSA infection nor to guide or monitor treatment for MRSA infections.   Urine culture     Status: None   Collection Time: 08/16/16  9:00 PM  Result Value Ref Range Status   Specimen Description URINE, CLEAN CATCH  Final   Special Requests NONE  Final   Culture NO GROWTH Performed at [facility]   Final   Report Status 08/18/2016 FINAL  Final     Medications:   . aspirin  325 mg Oral Daily  . atorvastatin  40 mg Oral q1800  . cefTRIAXone (ROCEPHIN)  IV  2 g Intravenous Q24H  . diltiazem  120 mg Oral Q12H  . enoxaparin (LOVENOX) injection  60 mg Subcutaneous Daily  . fluconazole  200 mg Oral Daily  . furosemide  40 mg Oral Daily  . mouth rinse  15 mL Mouth Rinse BID  . metoprolol  50 mg Oral BID  . morphine   Intravenous Q4H  . multivitamin with minerals  1 tablet Oral Daily  . nystatin  5 mL Oral QID  . polyethylene glycol  17 g Oral BID  . protein supplement shake  11 oz Oral BID BM  . saccharomyces boulardii  250 mg Oral BID  . sertraline  50 mg Oral Daily  . sorbitol, milk of mag, mineral oil, glycerin (SMOG) enema  960 mL Rectal Once  . vancomycin  1,500 mg Intravenous Q24H    Continuous Infusions:   Time spent: 15 min   LOS: 5 days   Charlynne Cousins  [facility] Pager [phone]  *Please refer to [email or web address], password [email or web address] to get updated schedule on who will round on this patient, as hospitalists switch teams weekly. If 7PM-7AM, please contact night-coverage at [email or web address], password [email or web address] for any overnight needs.  08/21/2016, 11:50 AM

## 2016-08-22 DIAGNOSIS — M25561 Pain in right knee: Secondary | ICD-10-CM

## 2016-08-22 DIAGNOSIS — Z7189 Other specified counseling: Secondary | ICD-10-CM

## 2016-08-22 MED ORDER — MORPHINE BOLUS VIA INFUSION
2.0000 mg | INTRAVENOUS | Status: DC | PRN
Start: 2016-08-22 — End: 2016-08-23
  Filled 2016-08-22: qty 2

## 2016-08-22 MED ORDER — NALOXONE HCL 0.4 MG/ML IJ SOLN: 0.4000 mg | INTRAMUSCULAR | Status: DC | PRN

## 2016-08-22 MED ORDER — PREMIER PROTEIN SHAKE: 2.0000 [oz_av] | Freq: Four times a day (QID) | ORAL | Status: DC

## 2016-08-22 MED ORDER — ONDANSETRON HCL 4 MG/2ML IJ SOLN: 4.0000 mg | Freq: Four times a day (QID) | INTRAMUSCULAR | Status: DC | PRN

## 2016-08-22 MED ORDER — SODIUM CHLORIDE 0.9% FLUSH: 9.0000 mL | INTRAVENOUS | Status: DC | PRN

## 2016-08-22 MED ORDER — DIPHENHYDRAMINE HCL 12.5 MG/5ML PO ELIX: 12.5000 mg | ORAL_SOLUTION | Freq: Four times a day (QID) | ORAL | Status: DC | PRN

## 2016-08-22 MED ORDER — MORPHINE SULFATE 2 MG/ML IV SOLN
INTRAVENOUS | Status: DC
  Administered 2016-08-22: 3 mg via INTRAVENOUS
  Administered 2016-08-23: 2.5 mg via INTRAVENOUS

## 2016-08-22 MED ORDER — DIPHENHYDRAMINE HCL 50 MG/ML IJ SOLN: 12.5000 mg | Freq: Four times a day (QID) | INTRAMUSCULAR | Status: DC | PRN

## 2016-08-22 MED ORDER — MORPHINE SULFATE ER 30 MG PO TBCR
30.0000 mg | EXTENDED_RELEASE_TABLET | Freq: Two times a day (BID) | ORAL | Status: DC
  Administered 2016-08-22: 30 mg via ORAL
  Filled 2016-08-22: qty 1

## 2016-08-22 NOTE — Progress Notes (Signed)
[facility] PROGRESS NOTE    Progress Note  Norma Ford  [id number] DOB: 01-Aug-1930 DOA: 08/15/2016 PCP: Ann Held, DO     Brief Narrative:   Norma Ford is an 82 y.o. female past medical history of morbid obesity, chronic right knee pain with history of knee replacement recently hospitalized for acute diastolic heart failure with atrial fibrillation and RVR and right great toe fracture, comes into the hospital for right lower extremity swelling and erythema juice found to have a white count 35,000 in the ED.  Assessment/Plan:   Leucocytosis likely due to Right lower extremity Cellulitis She has remained afebrile leukocytosis continues to improve continue IV Rocephin and vancomycin. She was started on a PCA pump she was pain is much better control. Erythema has not improve compared to 08/19/2016. She remains afebrile with no leukocytosis.  Thrombocytopenia: Improving likely due to infectious etiology. Lower extremity Doppler negative for DVT.  Hypertensive heart disease with chronic diastolic heart failure: Continue current regimen no changes were made.  Osteoarthrosis involving the lower leg: She is chronic knee pain for which she takes Vicodin. orthopedic surgeon was consulted recommended follow-up as an outpatient. PT evaluation recommended home health PT.  Morbid obesity ([place]) Counseling.  Paroxysmal atrial fibrillation (HCC) Control continue aspirin 325.  Acute on chronic diastolic heart failure ([place]) She seems to be euvolemic continue current regimen.  Oral thrush: Cont diflucan and nystatin, much improved now, no further white plaques.   DVT prophylaxis: lovenox Family Communication:daughter Disposition Plan/Barrier to D/C Home in 2 days Code Status:     Code Status Orders        Start     Ordered   08/16/16 0408  Full code  Continuous     08/16/16 0407    Code Status History    Date Active Date Inactive Code Status Order  ID Comments User Context   08/01/2016  2:52 AM 08/10/2016  9:28 PM Full Code [id number]  Ivor Costa, MD ED    Advance Directive Documentation   Flowsheet Row Most Recent Value  Type of Advance Directive  Out of facility DNR (pink MOST or yellow form)  Pre-existing out of facility DNR order (yellow form or pink MOST form)  No data  "MOST" Form in Place?  No data        IV Access:    Peripheral IV   Procedures and diagnostic studies:   No results found.   Medical Consultants:    None.  Anti-Infectives:   Vancomycin and Rocephin and Diflucan  Subjective:    Norma Ford she relates her pain is much better control the PCA pump. She also move her leg and she feels is less heavy.  Objective:    Vitals:   08/22/16 0000 08/22/16 0404 08/22/16 0600 08/22/16 0800  BP:   127/63   Pulse:      Resp: 16 16 16 18   Temp:   98.7 F (37.1 C)   TempSrc:   Oral   SpO2: 92% 93%  94%  Weight:      Height:        Intake/Output Summary (Last 24 hours) at 08/22/16 1012 Last data filed at 08/22/16 0429  Gross per 24 hour  Intake              240 ml  Output             1125 ml  Net             -  885 ml   Filed Weights   08/18/16 0400 08/19/16 0541 08/20/16 0552  Weight: 128.6 kg (283 lb 8.2 oz) 127.4 kg (280 lb 13.9 oz) 129.2 kg (284 lb 13.4 oz)    Exam: General exam: In no acute distress. Respiratory system: Good air movement and clear to auscultation. Cardiovascular system: S1 & S2 heard, RRR. No JVD. Gastrointestinal system: Abdomen is nondistended, soft and nontender.  Central nervous system: Alert and oriented. No focal neurological deficits. Extremities: No pedal edema. Skin: Erythema demarcation has not improve. Psychiatry: Judgement and insight appear normal. Mood & affect appropriate.    Data Reviewed:    Labs: Basic Metabolic Panel:  Recent Labs Lab 08/15/16 2326 08/17/16 0346 08/18/16 0349 08/19/16 0525  NA 137 138 138 138  K 3.5 4.1 4.6 5.1    CL 102 101 102 102  CO2 29 31 32 29  GLUCOSE 121* 100* 95 76  BUN 25* 23* 24* 22*  CREATININE 0.80 0.59 0.57 0.54  CALCIUM 8.6* 8.3* 8.5* 8.5*   GFR Estimated Creatinine Clearance: 67.5 mL/min (by C-G formula based on SCr of 0.54 mg/dL). Liver Function Tests:  Recent Labs Lab 08/15/16 2326  AST 19  ALT 65*  ALKPHOS 56  BILITOT 0.5  PROT 5.8*  ALBUMIN 2.6*   No results for input(s): LIPASE, AMYLASE in the last 168 hours. No results for input(s): AMMONIA in the last 168 hours. Coagulation profile No results for input(s): INR, PROTIME in the last 168 hours.  CBC:  Recent Labs Lab 08/15/16 2326 08/16/16 0507 08/17/16 0346 08/20/16 1310  WBC 35.3* 31.7* 24.2* 14.8*  NEUTROABS 32.4*  --   --   --   HGB 11.8* 11.5* 10.9* 11.5*  HCT 35.2* 33.7* 33.0* 35.2*  MCV 96.2 94.1 94.8 98.9  PLT 107* 91* 103* 139*   Cardiac Enzymes: No results for input(s): CKTOTAL, CKMB, CKMBINDEX, TROPONINI in the last 168 hours. BNP (last 3 results) No results for input(s): PROBNP in the last 8760 hours. CBG:  Recent Labs Lab 08/19/16 0714  GLUCAP 76   D-Dimer: No results for input(s): DDIMER in the last 72 hours. Hgb A1c: No results for input(s): HGBA1C in the last 72 hours. Lipid Profile: No results for input(s): CHOL, HDL, LDLCALC, TRIG, CHOLHDL, LDLDIRECT in the last 72 hours. Thyroid function studies: No results for input(s): TSH, T4TOTAL, T3FREE, THYROIDAB in the last 72 hours.  Invalid input(s): FREET3 Anemia work up: No results for input(s): VITAMINB12, FOLATE, FERRITIN, TIBC, IRON, RETICCTPCT in the last 72 hours. Sepsis Labs:  Recent Labs Lab 08/15/16 2326 08/15/16 2331 08/16/16 0507 08/17/16 0346 08/20/16 1310  WBC 35.3*  --  31.7* 24.2* 14.8*  LATICACIDVEN  --  0.99  --   --   --    Microbiology Recent Results (from the past 240 hour(s))  Blood Culture (routine x 2)     Status: None   Collection Time: 08/15/16 11:23 PM  Result Value Ref Range Status    Specimen Description BLOOD RIGHT FOREARM  Final   Special Requests BOTTLES DRAWN AEROBIC AND ANAEROBIC 5CC  Final   Culture   Final    NO GROWTH 5 DAYS Performed at [facility]    Report Status 08/21/2016 FINAL  Final  Blood Culture (routine x 2)     Status: None   Collection Time: 08/16/16 12:41 AM  Result Value Ref Range Status   Specimen Description BLOOD RIGHT ANTECUBITAL  Final   Special Requests BOTTLES DRAWN AEROBIC AND ANAEROBIC 5CC  EACH  Final   Culture   Final    NO GROWTH 5 DAYS Performed at [facility]    Report Status 08/21/2016 FINAL  Final  MRSA PCR Screening     Status: None   Collection Time: 08/16/16  5:07 AM  Result Value Ref Range Status   MRSA by PCR NEGATIVE NEGATIVE Final    Comment:        The GeneXpert MRSA Assay (FDA approved for NASAL specimens only), is one component of a comprehensive MRSA colonization surveillance program. It is not intended to diagnose MRSA infection nor to guide or monitor treatment for MRSA infections.   Urine culture     Status: None   Collection Time: 08/16/16  9:00 PM  Result Value Ref Range Status   Specimen Description URINE, CLEAN CATCH  Final   Special Requests NONE  Final   Culture NO GROWTH Performed at [facility]   Final   Report Status 08/18/2016 FINAL  Final     Medications:   . aspirin  325 mg Oral Daily  . atorvastatin  40 mg Oral q1800  . cefTRIAXone (ROCEPHIN)  IV  2 g Intravenous Q24H  . diltiazem  120 mg Oral Q12H  . enoxaparin (LOVENOX) injection  60 mg Subcutaneous Daily  . fluconazole  200 mg Oral Daily  . mouth rinse  15 mL Mouth Rinse BID  . metoprolol  50 mg Oral BID  . morphine   Intravenous Q4H  . multivitamin with minerals  1 tablet Oral Daily  . nystatin  5 mL Oral QID  . polyethylene glycol  17 g Oral BID  . protein supplement shake  11 oz Oral BID BM  . saccharomyces boulardii  250 mg Oral BID  . sertraline  50 mg Oral Daily  . vancomycin  1,500 mg  Intravenous Q24H   Continuous Infusions:   Time spent: 15 min   LOS: 6 days   Charlynne Cousins  [facility] Pager [phone]  *Please refer to [email or web address], password [email or web address] to get updated schedule on who will round on this patient, as hospitalists switch teams weekly. If 7PM-7AM, please contact night-coverage at [email or web address], password [email or web address] for any overnight needs.  08/22/2016, 10:12 AM

## 2016-08-22 NOTE — Progress Notes (Signed)
PT Cancellation Note  Patient Details Name: AUBRIAUNA WHITTIER MRN: [id number] DOB: 1930-03-24   Cancelled Treatment:     pt too fatigued after getting cleaned up from a BM.  Pt states "it takes 2" to roll me and "I am not able to get out of bed right now".  Family stated, pt plans to D/C back [facility].  Will check back as schedule permits.     Rica Koyanagi  PTA [facility]  Acute  Rehab Pager      [phone]

## 2016-08-22 NOTE — Progress Notes (Signed)
Nutrition Follow-up  DOCUMENTATION CODES:   Morbid obesity  INTERVENTION:  Continue Premier Protein BID, each supplement provides 160 kcal and 30 grams protein.  Continue Magic Cup TID, each supplement provides 290 kcal and 9 grams protein.  Continue daily multivitamin with minerals.  RD will monitor outcome of goals of care discussion with palliative care.  NUTRITION DIAGNOSIS:   Inadequate oral intake related to poor appetite as evidenced by per patient/family report.  Ongoing.  GOAL:   Patient will meet greater than or equal to 90% of their needs  Progressing.  MONITOR:   PO intake, Supplement acceptance, Weight trends, Labs, Skin, I & O's  REASON FOR ASSESSMENT:   Malnutrition Screening Tool    ASSESSMENT:   82 y.o. female with a past medical history significant for HTN, chronic right knee pain with knee replacement and revision, Morbid obesity, and remote mycosis fungoides who presents with right leg pain and redness and fever.  -Palliative care was consulted to discuss goals of care with patient and family.  Patient's daughter-in-law was present at time of assessment. Patient reports her appetite is still not good. She has a very dry mouth, and is still experiencing early satiety. Denies nausea. Per daughter-in-law patient is still not tolerating grits or soup, mainly having yogurt, protein shakes, and Magic Cup. In past 24 hrs she had 1 Magic Cup and 1 Premier Protein.   Meal Completion: 100% per chart - when discussed with family it is because patient is ordering less now. In the past 24 hrs patient has had 1684 kcal (100% estimated kcal needs) and 64 grams protein (85% minimum estimated protein needs). Intake has been improving with encouragement from family.  Weight Trend: CBW of 121.6 kg is -7.2 kg from admission. Question accuracy as it is an outlier - will continue to monitor.  Medications reviewed and include: multivitamin with minerals daily,  Miralax.  Labs reviewed. No new labs in past 48 hours.   Diet Order:  DIET SOFT Room service appropriate? Yes; Fluid consistency: Thin  Skin:  Reviewed, no issues  Last BM:  08/21/2016  Height:   Ht Readings from Last 1 Encounters:  08/16/16 5\' 2"  (1.575 m)    Weight:   Wt Readings from Last 1 Encounters:  08/22/16 268 lb 1.3 oz (121.6 kg)    Ideal Body Weight:  50 kg  BMI:  Body mass index is 49.03 kg/m.  Estimated Nutritional Needs:   Kcal:  [id number] (11-13 kcal/kg)  Protein:  75-85 grams (1.5-1.7 grams/kg IBW)  Fluid:  1.2-1.5 L/day  EDUCATION NEEDS:   No education needs identified at this time  Willey Blade, MS, RD, LDN Pager: [phone] After Hours Pager: [phone]

## 2016-08-22 NOTE — Progress Notes (Signed)
Pharmacy Antibiotic Follow-up Note  Norma Ford is a 82 y.o. year-old female admitted on 08/15/2016.  The patient is currently on day 4 of Rocephin & day 1 of Vancomycin for RLE cellulitis. - Redness and swelling worsened today, marking margins daily. Add Vancomycin  Assessment/Plan: Continue vancomycin 2gm x1, then 1500mg  q24, goal trough 10-15 mcg/ml  Temp (24hrs), Avg:98.5 F (36.9 C), Min:98.3 F (36.8 C), Max:98.7 F (37.1 C)   Recent Labs Lab 08/15/16 2326 08/16/16 0507 08/17/16 0346 08/20/16 1310  WBC 35.3* 31.7* 24.2* 14.8*     Recent Labs Lab 08/15/16 2326 08/17/16 0346 08/18/16 0349 08/19/16 0525  CREATININE 0.80 0.59 0.57 0.54   Estimated Creatinine Clearance: 65 mL/min (by C-G formula based on SCr of 0.54 mg/dL).    Allergies  Allergen Reactions  . Codeine     REACTION: ITCHING    Antimicrobials this admission: 10/10 zosyn >> 10/10 10/10 vancomycin >> 10/10 10/10 rocephin >> PTA (10/6) fluconazole >> (10/18) 10/13 Vancomycin >>  Levels/dose changes this admission:  Microbiology results: 10/9 BCx: NGTD 10/10 UCx: NG-final 10/10 MRSA PCR: negative  Thank you for allowing pharmacy to be a part of this patient's care.  Royetta Asal, PharmD, BCPS Pager [phone] 08/22/2016 10:55 AM

## 2016-08-22 NOTE — Consult Note (Signed)
Consultation Note Date: 08/22/2016   Patient Name: Norma Ford  DOB: [birth year]  MRN: [id number]  Age / Sex: 82 y.o., female  PCP: Ann Held, DO Referring Physician: Charlynne Cousins, MD  Reason for Consultation: Establishing goals of care, pain control  HPI/Patient Profile: 82 y.o. female  with past medical history of morbid obesity, chronic right knee pain with history of knee replacement recently hospitalized for acute diastolic heart failure with atrial fibrillation and RVR and right great toe fracture, admitted on 08/15/2016 with RLE cellulitis and right knee prosthesis fracture.  Palliative consulted for goals of care.   Clinical Assessment and Goals of Care: Met with patient, her daughter, her son, and her daughter in law.     At onset of encounter, patient's daughter in law Norma Ford) reported that she felt that current plan was working well and plan is to transition home with the support of hospice.  Patient's daughter, Norma Ford, then arrived and reported that she felt that patient was too medicated.  There was notable friction amongst family.  Patient agrees that the most important things moving forward are her comfort and being at home.  She reports that pain is much better controlled on current regimen.  Family is all in agreement that she will best be served with support of hospice on discharge.   SUMMARY OF RECOMMENDATIONS   - Family is in agreement that goal moving forward is home hospice. Referral placed to case management to present options. - Will work to begin transition off of PCA.  Her daughter reports that she is concerned that she has been "having other people push her button."  Over the last 24 hours, she has used '37mg'$  of morphine.  She is alert during our encounter and still complains at times of pain.  Oral morphine equivalent is '111mg'$  or oral morphine.  Will therefore plan  of initiation of MS contin 30 BID this evening.  Maintain PCA for breakthrough overnight.  Will ask PMT provider to f/u tomorrow to reassess and make recommendation for short acting medication for breakthrough pain on discharge.  As daughter reports concern that her mother has been getting too much medication, will decrease hourly PCA maximum to '3mg'$  per hour and add order for clinician bolus of '2mg'$  per our if nurse evaluates patient and determines need for additional medication.  Code Status/Advance Care Planning:  DNR   Symptom Management:   As above  Palliative Prophylaxis:   Frequent Pain Assessment  Psycho-social/Spiritual:   Desire for further Chaplaincy support:no  Additional Recommendations: Education on Hospice  Prognosis:   < 6 months  Discharge Planning: Home with Hospice      Primary Diagnoses: Present on Admission: . Cellulitis . Thrombocytopenia ([place]) . Osteoarthrosis involving lower leg . Chronic bronchitis ([place]) . Morbid obesity ([place]) . Hypertensive heart disease with CHF ([place]) . Paroxysmal atrial fibrillation ([facility]) . Acute on chronic diastolic heart failure ([place])   I have reviewed the medical record, interviewed the patient and family, and examined the patient.  The following aspects are pertinent.  Past Medical History:  Diagnosis Date  . Adrenal tumor 2002   right, followed by Surgery  . Barrett's esophagus 1999  . Cataracts, bilateral   . DECUBITUS ULCER, BUTTOCK 12/21/2009  . History of abnormal mammogram    2001-h/o abnormal mammo-told just calcium deposit-repeat  . HTN (hypertension)   . Hyperlipidemia   . Kidney stone   . MYCOSIS FUNGOIDES LYMPH NODES MULTIPLE SITES 09/21/2009  . THROMBOCYTOPENIA 12/21/2009   Social History   Social History  . Marital status: Married    Spouse name: N/A  . Number of children: N/A  . Years of education: N/A   Social History Main Topics  . Smoking status: Never Smoker  . Smokeless tobacco: Never  Used  . Alcohol use No  . Drug use: No  . Sexual activity: No   Other Topics Concern  . None   Social History Narrative  . None   Family History  Problem Relation Age of Onset  . Heart disease Mother   . Heart disease Father     pacemaker  . Kidney disease Father    Scheduled Meds: . aspirin  325 mg Oral Daily  . atorvastatin  40 mg Oral q1800  . cefTRIAXone (ROCEPHIN)  IV  2 g Intravenous Q24H  . diltiazem  120 mg Oral Q12H  . enoxaparin (LOVENOX) injection  60 mg Subcutaneous Daily  . fluconazole  200 mg Oral Daily  . mouth rinse  15 mL Mouth Rinse BID  . metoprolol  50 mg Oral BID  . morphine   Intravenous Q4H  . morphine  30 mg Oral Q12H  . multivitamin with minerals  1 tablet Oral Daily  . nystatin  5 mL Oral QID  . polyethylene glycol  17 g Oral BID  . protein supplement shake  11 oz Oral BID BM  . saccharomyces boulardii  250 mg Oral BID  . sertraline  50 mg Oral Daily  . vancomycin  1,500 mg Intravenous Q24H   Continuous Infusions:  PRN Meds:.albuterol, ALPRAZolam, diphenhydrAMINE **OR** diphenhydrAMINE, morphine, naloxone **AND** sodium chloride flush, ondansetron (ZOFRAN) IV, ondansetron **OR** [DISCONTINUED] ondansetron (ZOFRAN) IV, senna-docusate Medications Prior to Admission:  Prior to Admission medications   Medication Sig Start Date End Date Taking? Authorizing Provider  ALPRAZolam (XANAX) 0.25 MG tablet Take 1 tablet (0.25 mg total) by mouth 2 (two) times daily as needed for anxiety. 08/10/16  Yes Mir Marry Guan, MD  aspirin EC 325 MG EC tablet Take 1 tablet (325 mg total) by mouth daily. 08/10/16  Yes Mir Marry Guan, MD  diltiazem (CARDIZEM SR) 120 MG 12 hr capsule Take 1 capsule (120 mg total) by mouth every 12 (twelve) hours. 08/10/16  Yes Mir Marry Guan, MD  fluconazole (DIFLUCAN) 100 MG tablet Take 100 mg by mouth daily. 08/12/16 08/26/16 Yes Historical Provider, MD  furosemide (LASIX) 40 MG tablet Take 1 tablet (40 mg total)  by mouth daily. Patient taking differently: Take 80 mg by mouth daily.  08/10/16  Yes Mir Marry Guan, MD  HYDROcodone-acetaminophen (NORCO/VICODIN) 5-325 MG tablet Take 1 tablet by mouth every 4 (four) hours as needed for moderate pain. 08/10/16  Yes Mir Marry Guan, MD  metoprolol (LOPRESSOR) 50 MG tablet Take 1 tablet (50 mg total) by mouth 2 (two) times daily. 08/10/16  Yes Mir Marry Guan, MD  mometasone (NASONEX) 50 MCG/ACT nasal spray Place 2 sprays into the nose daily as needed (for congestion).    Yes Historical Provider,  MD  Multiple Vitamin (MULTIVITAMIN) tablet Take 1 tablet by mouth daily.     Yes Historical Provider, MD  Omega-3 Fatty Acids (FISH OIL PO) Take 1,000 mg by mouth 2 (two) times daily.    Yes Historical Provider, MD  polyethylene glycol powder (GLYCOLAX/MIRALAX) powder Take 17 g by mouth 2 (two) times daily as needed. Patient taking differently: Take 17 g by mouth 2 (two) times daily as needed for mild constipation or moderate constipation.  07/24/15  Yes Yvonne R Lowne Chase, DO  potassium chloride SA (K-DUR,KLOR-CON) 20 MEQ tablet Take 1 tablet (20 mEq total) by mouth daily. Patient taking differently: Take 40 mEq by mouth daily.  08/10/16  Yes Mir Marry Guan, MD  saccharomyces boulardii (FLORASTOR) 250 MG capsule Take 250 mg by mouth 2 (two) times daily.   Yes Historical Provider, MD  sertraline (ZOLOFT) 50 MG tablet Take 1 tablet by mouth  daily Patient taking differently: Take 50 mg by mouth daily 04/22/16  Yes Alferd Apa Lowne Chase, DO  simvastatin (ZOCOR) 80 MG tablet Take 1 tablet by mouth at  bedtime Patient taking differently: Take 80 mg by mouth at bedtime 08/03/15  Yes Rosalita Chessman Chase, DO   Allergies  Allergen Reactions  . Codeine     REACTION: ITCHING   Review of Systems  Constitutional: Positive for activity change, appetite change and unexpected weight change.  Gastrointestinal: Positive for nausea.  Musculoskeletal:  Positive for back pain.       Leg pain  Neurological: Positive for weakness.  Psychiatric/Behavioral: The patient is not nervous/anxious.     Physical Exam General exam: In no acute distress. Respiratory system: Good air movement and clear to auscultation. Cardiovascular system: S1 & S2 heard, RRR. No JVD. Gastrointestinal system: Abdomen is nondistended, soft and nontender.  Central nervous system: Alert and oriented. No focal neurological deficits. Extremities: No pedal edema. Skin: Erythema demarcation marked. Psychiatry: Judgement and insight appear normal. Mood & affect appropriate.    Vital Signs: BP 138/75 (BP Location: Left Wrist)   Pulse 99   Temp 98.7 F (37.1 C) (Oral)   Resp 20   Ht '5\' 2"'$  (1.575 m)   Wt 121.6 kg (268 lb 1.3 oz)   SpO2 96%   BMI 49.03 kg/m  Pain Assessment: 0-10 POSS *See Group Information*: 1-Acceptable,Awake and alert Pain Score: 6    SpO2: SpO2: 96 % O2 Device:SpO2: 96 % O2 Flow Rate: .O2 Flow Rate (L/min): 2 L/min  IO: Intake/output summary:  Intake/Output Summary (Last 24 hours) at 08/22/16 2304 Last data filed at 08/22/16 2256  Gross per 24 hour  Intake              562 ml  Output              550 ml  Net               12 ml    LBM: Last BM Date: 08/22/16 Baseline Weight: Weight: 128.8 kg (283 lb 15.2 oz) Most recent weight: Weight: 121.6 kg (268 lb 1.3 oz)     Palliative Assessment/Data: 30%     Time In: 1800 Time Out: 1850 Time Total: 50 Greater than 50%  of this time was spent counseling and coordinating care related to the above assessment and plan.  Signed by: Micheline Rough, MD   Please contact Palliative Medicine Team phone at [phone] for questions and concerns.  For individual provider: See Shea Evans

## 2016-08-23 ENCOUNTER — Telehealth: Payer: Self-pay | Admitting: Family Medicine

## 2016-08-23 DIAGNOSIS — Z515 Encounter for palliative care: Secondary | ICD-10-CM

## 2016-08-23 DIAGNOSIS — I5032 Chronic diastolic (congestive) heart failure: Secondary | ICD-10-CM

## 2016-08-23 DIAGNOSIS — Z7189 Other specified counseling: Secondary | ICD-10-CM

## 2016-08-23 LAB — CREATININE, SERUM
CREATININE: 0.48 mg/dL (ref 0.44–1.00)
GFR calc Af Amer: >60 mL/min (ref >60)
GFR calc non Af Amer: >60 mL/min (ref >60)

## 2016-08-23 MED ORDER — OXYCODONE HCL 5 MG PO TABS: 10.0000 mg | ORAL_TABLET | ORAL | Status: DC | PRN

## 2016-08-23 MED ORDER — MORPHINE SULFATE ER 15 MG PO TBCR
15.0000 mg | EXTENDED_RELEASE_TABLET | Freq: Two times a day (BID) | ORAL | Status: DC
  Administered 2016-08-24 – 2016-08-26 (×5): 15 mg via ORAL
  Filled 2016-08-23 (×6): qty 1

## 2016-08-23 MED ORDER — MORPHINE SULFATE (PF) 2 MG/ML IV SOLN: 2.0000 mg | INTRAVENOUS | Status: DC | PRN

## 2016-08-23 MED ORDER — MORPHINE SULFATE (CONCENTRATE) 10 MG/0.5ML PO SOLN: 5.0000 mg | ORAL | Status: DC | PRN

## 2016-08-23 MED ORDER — OXYCODONE HCL 5 MG PO TABS
10.0000 mg | ORAL_TABLET | ORAL | Status: DC | PRN
  Administered 2016-08-24 – 2016-08-26 (×6): 10 mg via ORAL
  Filled 2016-08-23 (×6): qty 2

## 2016-08-23 MED ORDER — SULFAMETHOXAZOLE-TRIMETHOPRIM 800-160 MG PO TABS
1.0000 | ORAL_TABLET | Freq: Two times a day (BID) | ORAL | Status: DC
  Administered 2016-08-23 – 2016-08-24 (×2): 1 via ORAL
  Filled 2016-08-23 (×2): qty 1

## 2016-08-23 NOTE — Progress Notes (Signed)
Referral called to [place].

## 2016-08-23 NOTE — Consult Note (Addendum)
   [facility] CM Inpatient Consult   08/23/2016  Norma Ford 1930/05/04 PH:[id number]    [facility] Care Management follow up. Spoke with inpatient RNCM who indicates the discharge plan is for home with hospice. Chart reviewed. [facility] Care Management not appropriate at this time. Will alert [place] Management office to close case.    Marthenia Rolling, MSN-Ed, RN,BSN [facility] Psychiatric Liaison [phone]

## 2016-08-23 NOTE — Progress Notes (Signed)
Wasted 39ml of Mophine IV PCA in sink, witnessed by Kathe Becton., RN.

## 2016-08-23 NOTE — Progress Notes (Signed)
Notified by Purcell Mouton,  CMRN of family request for Hospice and [facility] services at home after discharge. Chart and patient information currently under review to confirm hospice eligibility.   Spoke with both the patient and daughter in law Gabriell Deloera at bedside to initiate education related to hospice philosophy, services and team approach to care. Family verbalized understanding of the information provided. Per discussion plan is to discharge home via ambulance; discharge date undetermined at this time; possibly Thursday or Friday.   Please send signed completed DNR form home with patient.   Patient will need prescriptions for discharge comfort medications.   DME needs discussed and family requested a mattress and rails for an existing hospital bed in t;he home as well as an oxygen concentrator for PRN use.    [facility] equipment manager Jewel Ysidro Evert notified and will contact [facility] to arrange delivery to the home.  The home address has been verified and is correct in the chart; Hanny Coltrin is the family member to be contacted to arrange time of delivery.  [facility] aware of the above.  Completed discharge summary will need to be faxed to [facility] at [phone] when final.  Please notify [facility] when patient is ready to leave unit at discharge-call [phone].  [facility] information and contact numbers have been given to [place] during visit.  Above information shared with   Purcell Mouton, CMRN.   Please call with any questions.  Thank You,  Mickie Kay,  [facility] Liaison

## 2016-08-23 NOTE — Progress Notes (Addendum)
[facility] PROGRESS NOTE    Progress Note  Norma Ford  [id number] DOB: [birth year] DOA: 08/15/2016 PCP: Ann Held, DO     Brief Narrative:   Norma Ford is an 82 y.o. female past medical history of morbid obesity, chronic right knee pain with history of knee replacement recently hospitalized for acute diastolic heart failure with atrial fibrillation and RVR and right great toe fracture, comes into the hospital for right lower extremity swelling and erythema juice found to have a white count 35,000 in the ED.Has been treated with IV vancomycin and Rocephin for 7 days as her leg started to improve, palliative care was consulted and they decided to go home with hospice care, she was transitioned to Bactrim which she will continue as an outpatient, and we have titrated her to oral narcotics.   Assessment/Plan:   Leucocytosis likely due to Right lower extremity Cellulitis She has remained afebrile leukocytosis continues to improve continue IV Rocephin and vancomycin. She was changed to MS Contin 15 mg twice a day plus oral oxycodone for breakthrough pain. Started to improve on 08/23/2016. Will change antibiotic coverage to Bactrim twice a day we she will continue for 7 additional days. Hospice and palliative care was consulted and the family decided to move towards comfort measure for this infection treated hospice to follow-up at home.  Thrombocytopenia: Improving likely due to infectious etiology. Lower extremity Doppler negative for DVT.  Hypertensive heart disease with chronic diastolic heart failure: Continue current regimen no changes were made.  Osteoarthrosis involving the lower leg: She is chronic knee pain for which she takes Vicodin. orthopedic surgeon was consulted recommended follow-up as an outpatient. PT evaluation recommended home health PT.  Morbid obesity ([place]) Counseling.  Paroxysmal atrial fibrillation (H[facility]) Control continue aspirin  325.  Acute on chronic diastolic heart failure ([place]) She seems to be euvolemic continue current regimen.  Oral thrush: Cont diflucan and nystatin, much improved now, no further white plaques.   DVT prophylaxis: lovenox Family Communication:daughter Disposition Plan/Barrier to D/C Home in 2 days Code Status:     Code Status Orders        Start     Ordered   08/16/16 0408  Full code  Continuous     08/16/16 0407    Code Status History    Date Active Date Inactive Code Status Order ID Comments User Context   08/01/2016  2:52 AM 08/10/2016  9:28 PM Full Code [id number]  Ivor Costa, MD ED    Advance Directive Documentation   Flowsheet Row Most Recent Value  Type of Advance Directive  Out of facility DNR (pink MOST or yellow form)  Pre-existing out of facility DNR order (yellow form or pink MOST form)  No data  "MOST" Form in Place?  No data        IV Access:    Peripheral IV   Procedures and diagnostic studies:   No results found.   Medical Consultants:    None.  Anti-Infectives:   Vancomycin and Rocephin and Diflucan  Subjective:    [name] she relates her pain is much better control the PCA pump. She also move her leg and she feels is less heavy.  Objective:    Vitals:   08/23/16 0240 08/23/16 0359 08/23/16 0710 08/23/16 0800  BP:   127/71   Pulse:   91   Resp: 14 15 14 15   Temp:   98.2 F (36.8 C)   TempSrc:  Oral   SpO2: 92% 93% 97% 97%  Weight:      Height:        Intake/Output Summary (Last 24 hours) at 08/23/16 1141 Last data filed at 08/23/16 1053  Gross per 24 hour  Intake              582 ml  Output              650 ml  Net              -68 ml   Filed Weights   08/19/16 0541 08/20/16 0552 08/22/16 1000  Weight: 127.4 kg (280 lb 13.9 oz) 129.2 kg (284 lb 13.4 oz) 121.6 kg (268 lb 1.3 oz)    Exam: General exam: In no acute distress. Respiratory system: Good air movement and clear to auscultation. Cardiovascular  system: S1 & S2 heard, RRR. No JVD. Gastrointestinal system: Abdomen is nondistended, soft and nontender.  Central nervous system: Alert and oriented. No focal neurological deficits. Extremities: No pedal edema. Skin: Erythema demarcation has not improve. Psychiatry: Judgement and insight appear normal. Mood & affect appropriate.    Data Reviewed:    Labs: Basic Metabolic Panel:  Recent Labs Lab 08/17/16 0346 08/18/16 0349 08/19/16 0525 08/23/16 0351  NA 138 138 138  --   K 4.1 4.6 5.1  --   CL 101 102 102  --   CO2 31 32 29  --   GLUCOSE 100* 95 76  --   BUN 23* 24* 22*  --   CREATININE 0.59 0.57 0.54 0.48  CALCIUM 8.3* 8.5* 8.5*  --    GFR Estimated Creatinine Clearance: 65 mL/min (by C-G formula based on SCr of 0.48 mg/dL). Liver Function Tests: No results for input(s): AST, ALT, ALKPHOS, BILITOT, PROT, ALBUMIN in the last 168 hours. No results for input(s): LIPASE, AMYLASE in the last 168 hours. No results for input(s): AMMONIA in the last 168 hours. Coagulation profile No results for input(s): INR, PROTIME in the last 168 hours.  CBC:  Recent Labs Lab 08/17/16 0346 08/20/16 1310  WBC 24.2* 14.8*  HGB 10.9* 11.5*  HCT 33.0* 35.2*  MCV 94.8 98.9  PLT 103* 139*   Cardiac Enzymes: No results for input(s): CKTOTAL, CKMB, CKMBINDEX, TROPONINI in the last 168 hours. BNP (last 3 results) No results for input(s): PROBNP in the last 8760 hours. CBG:  Recent Labs Lab 08/19/16 0714  GLUCAP 76   D-Dimer: No results for input(s): DDIMER in the last 72 hours. Hgb A1c: No results for input(s): HGBA1C in the last 72 hours. Lipid Profile: No results for input(s): CHOL, HDL, LDLCALC, TRIG, CHOLHDL, LDLDIRECT in the last 72 hours. Thyroid function studies: No results for input(s): TSH, T4TOTAL, T3FREE, THYROIDAB in the last 72 hours.  Invalid input(s): FREET3 Anemia work up: No results for input(s): VITAMINB12, FOLATE, FERRITIN, TIBC, IRON, RETICCTPCT in the  last 72 hours. Sepsis Labs:  Recent Labs Lab 08/17/16 0346 08/20/16 1310  WBC 24.2* 14.8*   Microbiology Recent Results (from the past 240 hour(s))  Blood Culture (routine x 2)     Status: None   Collection Time: 08/15/16 11:23 PM  Result Value Ref Range Status   Specimen Description BLOOD RIGHT FOREARM  Final   Special Requests BOTTLES DRAWN AEROBIC AND ANAEROBIC 5CC  Final   Culture   Final    NO GROWTH 5 DAYS Performed at [facility]    Report Status 08/21/2016 FINAL  Final  Blood Culture (  routine x 2)     Status: None   Collection Time: 08/16/16 12:41 AM  Result Value Ref Range Status   Specimen Description BLOOD RIGHT ANTECUBITAL  Final   Special Requests BOTTLES DRAWN AEROBIC AND ANAEROBIC 5CC EACH  Final   Culture   Final    NO GROWTH 5 DAYS Performed at [facility]    Report Status 08/21/2016 FINAL  Final  MRSA PCR Screening     Status: None   Collection Time: 08/16/16  5:07 AM  Result Value Ref Range Status   MRSA by PCR NEGATIVE NEGATIVE Final    Comment:        The GeneXpert MRSA Assay (FDA approved for NASAL specimens only), is one component of a comprehensive MRSA colonization surveillance program. It is not intended to diagnose MRSA infection nor to guide or monitor treatment for MRSA infections.   Urine culture     Status: None   Collection Time: 08/16/16  9:00 PM  Result Value Ref Range Status   Specimen Description URINE, CLEAN CATCH  Final   Special Requests NONE  Final   Culture NO GROWTH Performed at [facility]   Final   Report Status 08/18/2016 FINAL  Final     Medications:   . aspirin  325 mg Oral Daily  . atorvastatin  40 mg Oral q1800  . diltiazem  120 mg Oral Q12H  . enoxaparin (LOVENOX) injection  60 mg Subcutaneous Daily  . fluconazole  200 mg Oral Daily  . mouth rinse  15 mL Mouth Rinse BID  . metoprolol  50 mg Oral BID  . [START ON 08/24/2016] morphine  15 mg Oral Q12H  . multivitamin with  minerals  1 tablet Oral Daily  . nystatin  5 mL Oral QID  . polyethylene glycol  17 g Oral BID  . protein supplement shake  11 oz Oral BID BM  . saccharomyces boulardii  250 mg Oral BID  . sertraline  50 mg Oral Daily  . sulfamethoxazole-trimethoprim  1 tablet Oral Q12H   Continuous Infusions:   Time spent: 15 min   LOS: 7 days   Charlynne Cousins  [facility] Pager [phone]  *Please refer to [email or web address], password [email or web address] to get updated schedule on who will round on this patient, as hospitalists switch teams weekly. If 7PM-7AM, please contact night-coverage at [email or web address], password [email or web address] for any overnight needs.  08/23/2016, 11:41 AM

## 2016-08-23 NOTE — Progress Notes (Signed)
Pt and daughter in law at bedside selected [facility] for Home with hospice.

## 2016-08-23 NOTE — Telephone Encounter (Signed)
Hospice was calling to see if Dr.Lowne would be the attending provider and if the patient should have standing hospice care prn. Verbal given per Lowne protocol.   KP

## 2016-08-23 NOTE — Progress Notes (Signed)
Daily Progress Note   Patient Name: Norma Ford       Date: 08/23/2016 DOB: [birth year]  Age: 82 y.o. MRN#: [id number] Attending Physician: Charlynne Cousins, MD Primary Care Physician: Ann Held, DO Admit Date: 08/15/2016  Reason for Consultation/Follow-up: Pain control 82 y.o. female  with past medical history of morbid obesity, chronic right knee pain with history of knee replacement recently hospitalized for acute diastolic heart failure with atrial fibrillation and RVR and right great toe fracture, admitted on 08/15/2016 with RLE cellulitis and right knee prosthesis fracture.  Palliative consulted for goals of care.   Subjective:  patient is resting in bed, she is in no distress.  She has tolerated being started on the MS contin well.  She does not like the CO2 monitor from her PCA.   Discussed with daughter in law at the bedside, see below:  Length of Stay: 7  Current Medications: Scheduled Meds:  . aspirin  325 mg Oral Daily  . atorvastatin  40 mg Oral q1800  . diltiazem  120 mg Oral Q12H  . enoxaparin (LOVENOX) injection  60 mg Subcutaneous Daily  . fluconazole  200 mg Oral Daily  . mouth rinse  15 mL Mouth Rinse BID  . metoprolol  50 mg Oral BID  . morphine  15 mg Oral Q12H  . multivitamin with minerals  1 tablet Oral Daily  . nystatin  5 mL Oral QID  . polyethylene glycol  17 g Oral BID  . protein supplement shake  11 oz Oral BID BM  . saccharomyces boulardii  250 mg Oral BID  . sertraline  50 mg Oral Daily  . sulfamethoxazole-trimethoprim  1 tablet Oral Q12H    Continuous Infusions:    PRN Meds: albuterol, ALPRAZolam, morphine injection, ondansetron **OR** [DISCONTINUED] ondansetron (ZOFRAN) IV, oxyCODONE, senna-docusate  Physical Exam           Elderly weak lady NAD Awake alert S1 S2 Clear Abdomen soft + erythema, edema LE  Vital Signs: BP 127/71 (BP Location: Left Wrist)   Pulse 91   Temp 98.2 F (36.8 C) (Oral)   Resp 15   Ht 5\' 2"  (1.575 m)   Wt 121.6 kg (268 lb 1.3 oz)   SpO2 97%   BMI 49.03 kg/m  SpO2: SpO2: 97 % O2  Device: O2 Device: Nasal Cannula O2 Flow Rate: O2 Flow Rate (L/min): 2 L/min  Intake/output summary:  Intake/Output Summary (Last 24 hours) at 08/23/16 1005 Last data filed at 08/22/16 2256  Gross per 24 hour  Intake              342 ml  Output              150 ml  Net              192 ml   LBM: Last BM Date: 08/22/16 Baseline Weight: Weight: 128.8 kg (283 lb 15.2 oz) Most recent weight: Weight: 121.6 kg (268 lb 1.3 oz)       Palliative Assessment/Data:    Flowsheet Rows   Flowsheet Row Most Recent Value  Intake Tab  Reason for referral  Pain  Clinical Assessment  Palliative Performance Scale Score  30%  Psychosocial & Spiritual Assessment  Palliative Care Outcomes      Patient Active Problem List   Diagnosis Date Noted  . Cellulitis 08/16/2016  . Cellulitis of right lower extremity   . Chronic atrial fibrillation ([place])   . Acute on chronic diastolic heart failure ([place]) 08/12/2016  . Closed fracture of phalanx of toe of right foot, initial encounter   . Chronic diastolic CHF (congestive heart failure) ([place]) 08/04/2016  . Paroxysmal atrial fibrillation ([place]) 08/01/2016  . Depression 08/01/2016  . Right leg pain 07/01/2016  . Frequent falls 02/14/2016  . History of fall 11/14/2015  . Osteoarthritis of both knees 05/15/2015  . Morbid obesity ([place]) 02/24/2014  . Edema 05/21/2012  . Chronic bronchitis ([place]) 11/03/2011  . Thrombocytopenia ([place]) 12/21/2009  . DECUBITUS ULCER, BUTTOCK 12/21/2009  . MYCOSIS FUNGOIDES LYMPH NODES MULTIPLE SITES 09/21/2009  . Osteoarthrosis involving lower leg 09/21/2009  . SHOULDER PAIN, RIGHT 09/21/2009  . FEMALE STRESS INCONTINENCE  01/16/2009  . HLD (hyperlipidemia) 02/12/2008  . Hypertensive heart disease with CHF ([place]) 02/12/2008    Palliative Care Assessment & Plan   Patient Profile:  82 y.o. female  with past medical history of morbid obesity, chronic right knee pain with history of knee replacement recently hospitalized for acute diastolic heart failure with atrial fibrillation and RVR and right great toe fracture, admitted on 08/15/2016 with RLE cellulitis and right knee prosthesis fracture.  Palliative consulted for goals of care.    Assessment:  1. Pain in leg, generalized pain 2. Diastolic CHF 3. RLE cellulitis.   Recommendations/Plan:  D/C PCA.    Continue MS contin BID  Oxy IR for prn breakthrough   Patient is already on anti emetic and bowel regimen, to continue on discharge.   Daughter in law is asking for a new hospital bed, wishes to speak with hospice liaison today, expect case management follow up.   Code Status:    Code Status Orders        Start     Ordered   08/21/16 1944  Do not attempt resuscitation (DNR)  Continuous    Question Answer Comment  In the event of cardiac or respiratory ARREST Do not call a "code blue"   In the event of cardiac or respiratory ARREST Do not perform Intubation, CPR, defibrillation or ACLS   In the event of cardiac or respiratory ARREST Use medication by any route, position, wound care, and other measures to relive pain and suffering. May use oxygen, suction and manual treatment of airway obstruction as needed for comfort.      08/21/16 1943  Code Status History    Date Active Date Inactive Code Status Order ID Comments User Context   08/21/2016  7:39 PM 08/21/2016  7:43 PM DNR [id number]  Micheline Rough, MD Inpatient   08/16/2016  4:08 AM 08/21/2016  7:39 PM Full Code [id number]  Edwin Dada, MD Inpatient   08/01/2016  2:52 AM 08/10/2016  9:28 PM Full Code [id number]  Ivor Costa, MD ED    Advance Directive Documentation   Flowsheet Row Most  Recent Value  Type of Advance Directive  Out of facility DNR (pink MOST or yellow form)  Pre-existing out of facility DNR order (yellow form or pink MOST form)  No data  "MOST" Form in Place?  No data       Prognosis:   < 6 months  Discharge Planning:  Home with Hospice  Care plan was discussed with  Patient, daughter in law   Thank you for allowing the Palliative Medicine Team to assist in the care of this patient.   Time In: 9.30 Time Out: 10.05 Total Time 35 Prolonged Time Billed  no       Greater than 50%  of this time was spent counseling and coordinating care related to the above assessment and plan.  Loistine Chance, MD [phone]  Please contact Palliative Medicine Team phone at [phone] for questions and concerns.

## 2016-08-23 NOTE — Telephone Encounter (Signed)
[name]: Relationship to patient: Self Can be reached: [phone] Pharmacy:  Reason for call: Please call Amber @ [facility] to discuss this patient

## 2016-08-23 NOTE — Care Management Important Message (Signed)
Important Message  Patient Details  Name: Norma Ford MRN: [id number] Date of Birth: [birth year]   Medicare Important Message Given:  Yes    Camillo Flaming 08/23/2016, 11:17 AMImportant Message  Patient Details  Name: Norma Ford MRN: [id number] Date of Birth: December 31, 1929   Medicare Important Message Given:  Yes    Camillo Flaming 08/23/2016, 11:17 AM

## 2016-08-24 DIAGNOSIS — L03115 Cellulitis of right lower limb: Principal | ICD-10-CM

## 2016-08-24 DIAGNOSIS — I482 Chronic atrial fibrillation: Secondary | ICD-10-CM

## 2016-08-24 DIAGNOSIS — M1710 Unilateral primary osteoarthritis, unspecified knee: Secondary | ICD-10-CM

## 2016-08-24 LAB — URIC ACID: Uric Acid, Serum: 2.8 mg/dL (ref 2.3–6.6)

## 2016-08-24 MED ORDER — DEXTROSE 5 % IV SOLN
2.0000 g | INTRAVENOUS | Status: DC
  Administered 2016-08-24 – 2016-08-25 (×2): 2 g via INTRAVENOUS
  Filled 2016-08-24 (×2): qty 2

## 2016-08-24 MED ORDER — SENNA 8.6 MG PO TABS
2.0000 | ORAL_TABLET | Freq: Every day | ORAL | Status: DC
  Administered 2016-08-24: 17.2 mg via ORAL
  Filled 2016-08-24: qty 2

## 2016-08-24 MED ORDER — SULFAMETHOXAZOLE-TRIMETHOPRIM 800-160 MG PO TABS
2.0000 | ORAL_TABLET | Freq: Two times a day (BID) | ORAL | Status: DC
  Administered 2016-08-24 – 2016-08-25 (×2): 2 via ORAL
  Filled 2016-08-24 (×2): qty 2

## 2016-08-24 MED ORDER — BISACODYL 10 MG RE SUPP
10.0000 mg | Freq: Once | RECTAL | Status: AC
  Administered 2016-08-24: 10 mg via RECTAL
  Filled 2016-08-24: qty 1

## 2016-08-24 NOTE — Progress Notes (Signed)
PT Cancellation Note  Patient Details Name: Norma Ford MRN: [id number] DOB: 01/26/1930   Cancelled Treatment:    Reason Eval/Treat Not Completed: Medical issues which prohibited therapy Noted pt pending home hospice eligibility.   Pt lethargic today and had untouched lunch tray bedside her.  Pt states she is not hungry, has no appetite, and has finally become comfortable (pain meds working currently).  Pt falling asleep during conversation and no family present.  Will check back later this week or please page if new needs arise.   Leon Montoya,KATHrine E 08/24/2016, 2:24 PM Carmelia Bake, PT, DPT 08/24/2016 Pager: [phone]

## 2016-08-24 NOTE — Progress Notes (Signed)
PROGRESS NOTE  Norma Ford  [id number] DOB: 07-31-1930 DOA: 08/15/2016 PCP: Ann Held, DO  Brief Narrative:  Norma Ford is an 82 y.o. female past medical history of morbid obesity, chronic right knee pain with history of knee replacement recently hospitalized for acute diastolic heart failure with atrial fibrillation and RVR and right great toe fracture, comes into the hospital for right lower extremity swelling and erythema juice found to have a white count 35,000 in the ED.Has been treated with IV vancomycin and Rocephin for 7 days as her leg started to improve, palliative care was consulted and they decided to go home with hospice care, she was transitioned to Bactrim which she will continue as an outpatient, and we have titrated her to oral narcotics.  Assessment & Plan:   Principal Problem:   Cellulitis Active Problems:   Thrombocytopenia ([place])   Hypertensive heart disease with CHF ([place])   Osteoarthrosis involving lower leg   Chronic bronchitis ([facility])   Morbid obesity ([facility])   Paroxysmal atrial fibrillation (H[facility])   Acute on chronic diastolic heart failure (H[facility])   Cellulitis of right lower extremity   Chronic atrial fibrillation ([place])   Encounter for palliative care   Goals of care, counseling/discussion  Right lower extremity cellulitis.  Still has pronounced erythema of the right lower extremity although it appears to be receding from the line was drawn on 10/9.  Unusual that she continues to have a relatively severe degree of erythema despite many days of antibiotics.  Consider gouty attack.  Also consider presence of foreign body (hardware in right knee appeared loosened on last XR.   -  Elevate right lower extremity -  Resume ceftriaxone -  Increased Bactrim -  Check uric acid  Chronic pain with narcotic induced constipation -  Appreciate palliative care assistance -  Continue MS Contin 30 mg twice a day plus oral oxycodone for breakthrough pain. -   Continue MiraLAX -  Schedule senna -  Bisacodyl 1  Thrombocytopenia: Improving likely due to infectious etiology. Lower extremity Doppler negative for DVT.  Hypertensive heart disease with chronic diastolic heart failure: Continue current regimen no changes were made.  Osteoarthrosis involving the lower leg: orthopedic surgeon was consulted recommended follow-up as an outpatient.  Morbid obesity ([place]) Counseling.  Paroxysmal atrial fibrillation (H[facility]) Control continue aspirin 325.  Acute on chronic diastolic heart failure ([place]) She seems to be euvolemic continue current regimen.  Oral thrush, resolving Cont nystatin Incidental resuming fluconazole if plaques recur   DVT prophylaxis:  Lovenox Code Status:  DO NOT RESUSCITATE Family Communication:  Patient alone Disposition Plan:  Home with hospice. Equipment is being delivered late this afternoon.  We will plan to discharge tomorrow morning.     Consultants:   Palliative care  Procedures:  None  Antimicrobials:  Anti-infectives    Start     Dose/Rate Route Frequency Ordered Stop   08/24/16 2200  sulfamethoxazole-trimethoprim (BACTRIM DS,SEPTRA DS) 800-160 MG per tablet 2 tablet     2 tablet Oral Every 12 hours 08/24/16 1204     08/24/16 1400  cefTRIAXone (ROCEPHIN) 2 g in dextrose 5 % 50 mL IVPB     2 g 100 mL/hr over 30 Minutes Intravenous Every 24 hours 08/24/16 1204     08/23/16 2200  sulfamethoxazole-trimethoprim (BACTRIM DS,SEPTRA DS) 800-160 MG per tablet 1 tablet  Status:  Discontinued     1 tablet Oral Every 12 hours 08/23/16 1004 08/24/16 1204   08/20/16 1000  vancomycin (VANCOCIN) 1,500 mg in sodium chloride 0.9 % 500 mL IVPB  Status:  Discontinued     1,500 mg 250 mL/hr over 120 Minutes Intravenous Every 24 hours 08/19/16 1111 08/23/16 1004   08/19/16 1000  vancomycin (VANCOCIN) 2,000 mg in sodium chloride 0.9 % 500 mL IVPB     2,000 mg 250 mL/hr over 120 Minutes Intravenous  Once 08/19/16 0929  08/19/16 1234   08/18/16 1100  fluconazole (DIFLUCAN) tablet 200 mg     200 mg Oral Daily 08/18/16 0953 08/24/16 1009   08/18/16 1000  fluconazole (DIFLUCAN) tablet 100 mg  Status:  Discontinued     100 mg Oral Daily 08/17/16 1025 08/18/16 0953   08/16/16 1000  fluconazole (DIFLUCAN) tablet 100 mg     100 mg Oral Daily 08/16/16 0628 08/17/16 0922   08/16/16 0800  piperacillin-tazobactam (ZOSYN) IVPB 3.375 g  Status:  Discontinued     3.375 g 12.5 mL/hr over 240 Minutes Intravenous Every 8 hours 08/16/16 0105 08/16/16 0407   08/16/16 0800  cefTRIAXone (ROCEPHIN) 2 g in dextrose 5 % 50 mL IVPB  Status:  Discontinued     2 g 100 mL/hr over 30 Minutes Intravenous Every 24 hours 08/16/16 0407 08/23/16 1004   08/16/16 0415  vancomycin (VANCOCIN) IVPB 1000 mg/200 mL premix  Status:  Discontinued     1,000 mg 200 mL/hr over 60 Minutes Intravenous NOW 08/16/16 0407 08/16/16 0422   08/16/16 0000  piperacillin-tazobactam (ZOSYN) IVPB 3.375 g     3.375 g 100 mL/hr over 30 Minutes Intravenous  Once 08/15/16 2355 08/16/16 0139   08/16/16 0000  vancomycin (VANCOCIN) IVPB 1000 mg/200 mL premix     1,000 mg 200 mL/hr over 60 Minutes Intravenous  Once 08/15/16 2355 08/16/16 0307       Subjective:  Still having severe pain in her right lower extremity. Tender even when the sheet brushes it. Denies fevers, chills. Mentation is better today according to her caretaker and to the patient. She was able to eat some of her breakfast without assistance this morning.  The patient prefers comfort measures and hospice. Her daughter however refers ongoing therapy.  Objective: Vitals:   08/23/16 1500 08/23/16 2124 08/24/16 0502 08/24/16 1008  BP: 103/76 (!) 114/59 124/66 (!) 105/54  Pulse: 74 (!) 103 78 89  Resp: 16 16 16    Temp: 98 F (36.7 C) 99.3 F (37.4 C) 97.6 F (36.4 C)   TempSrc: Oral Oral Oral   SpO2: 91% 94% 96%   Weight:   122.1 kg (269 lb 2.9 oz)   Height:        Intake/Output Summary  (Last 24 hours) at 08/24/16 1512 Last data filed at 08/24/16 0913  Gross per 24 hour  Intake              340 ml  Output              275 ml  Net               65 ml   Filed Weights   08/20/16 0552 08/22/16 1000 08/24/16 0502  Weight: 129.2 kg (284 lb 13.4 oz) 121.6 kg (268 lb 1.3 oz) 122.1 kg (269 lb 2.9 oz)    Examination:  General exam:  Adult Female.  No acute distress.  HEENT:  NCAT, MMM Respiratory system: Clear to auscultation bilaterally Cardiovascular system: Regular rate and rhythm, normal S1/S2. No murmurs, rubs, gallops or clicks.  Warm extremities Gastrointestinal system: Normal  active bowel sounds, soft, nondistended, nontender. MSK:  Normal tone and increased bulk, 2+ pitting edema of the right lower extremity, 1+ left lower extremity. Bright pinkish red erythema extending circumferentially around the distal shin.  Somewhat warm to touch and very tender to palpation. Neuro:  Grossly moves all extremities but diffusely weak    Data Reviewed: I have personally reviewed following labs and imaging studies  CBC:  Recent Labs Lab 08/20/16 1310  WBC 14.8*  HGB 11.5*  HCT 35.2*  MCV 98.9  PLT [id number]*   Basic Metabolic Panel:  Recent Labs Lab 08/18/16 0349 08/19/16 0525 08/23/16 0351  NA 138 138  --   K 4.6 5.1  --   CL 102 102  --   CO2 32 29  --   GLUCOSE 95 76  --   BUN 24* 22*  --   CREATININE 0.57 0.54 0.48  CALCIUM 8.5* 8.5*  --    GFR: Estimated Creatinine Clearance: 65.2 mL/min (by C-G formula based on SCr of 0.48 mg/dL). Liver Function Tests: No results for input(s): AST, ALT, ALKPHOS, BILITOT, PROT, ALBUMIN in the last 168 hours. No results for input(s): LIPASE, AMYLASE in the last 168 hours. No results for input(s): AMMONIA in the last 168 hours. Coagulation Profile: No results for input(s): INR, PROTIME in the last 168 hours. Cardiac Enzymes: No results for input(s): CKTOTAL, CKMB, CKMBINDEX, TROPONINI in the last 168 hours. BNP (last 3  results) No results for input(s): PROBNP in the last 8760 hours. HbA1C: No results for input(s): HGBA1C in the last 72 hours. CBG:  Recent Labs Lab 08/19/16 0714  GLUCAP 76   Lipid Profile: No results for input(s): CHOL, HDL, LDLCALC, TRIG, CHOLHDL, LDLDIRECT in the last 72 hours. Thyroid Function Tests: No results for input(s): TSH, T4TOTAL, FREET4, T3FREE, THYROIDAB in the last 72 hours. Anemia Panel: No results for input(s): VITAMINB12, FOLATE, FERRITIN, TIBC, IRON, RETICCTPCT in the last 72 hours. Urine analysis:    Component Value Date/Time   COLORURINE YELLOW 08/16/2016 2100   APPEARANCEUR CLEAR 08/16/2016 2100   LABSPEC 1.019 08/16/2016 2100   PHURINE 6.5 08/16/2016 2100   GLUCOSEU NEGATIVE 08/16/2016 2100   HGBUR NEGATIVE 08/16/2016 2100   HGBUR negative 09/23/2010 0825   BILIRUBINUR NEGATIVE 08/16/2016 2100   BILIRUBINUR Neg 02/11/2016 [place] 08/16/2016 2100   PROTEINUR NEGATIVE 08/16/2016 2100   UROBILINOGEN 2.0 02/11/2016 1150   UROBILINOGEN negative 09/23/2010 0825   NITRITE NEGATIVE 08/16/2016 2100   LEUKOCYTESUR MODERATE (A) 08/16/2016 2100   Sepsis Labs: @LABRCNTIP (procalcitonin:4,lacticidven:4)  ) Recent Results (from the past 240 hour(s))  Blood Culture (routine x 2)     Status: None   Collection Time: 08/15/16 11:23 PM  Result Value Ref Range Status   Specimen Description BLOOD RIGHT FOREARM  Final   Special Requests BOTTLES DRAWN AEROBIC AND ANAEROBIC 5CC  Final   Culture   Final    NO GROWTH 5 DAYS Performed at [facility]    Report Status 08/21/2016 FINAL  Final  Blood Culture (routine x 2)     Status: None   Collection Time: 08/16/16 12:41 AM  Result Value Ref Range Status   Specimen Description BLOOD RIGHT ANTECUBITAL  Final   Special Requests BOTTLES DRAWN AEROBIC AND ANAEROBIC 5CC EACH  Final   Culture   Final    NO GROWTH 5 DAYS Performed at [facility]    Report Status 08/21/2016 FINAL  Final    MRSA PCR Screening  Status: None   Collection Time: 08/16/16  5:07 AM  Result Value Ref Range Status   MRSA by PCR NEGATIVE NEGATIVE Final    Comment:        The GeneXpert MRSA Assay (FDA approved for NASAL specimens only), is one component of a comprehensive MRSA colonization surveillance program. It is not intended to diagnose MRSA infection nor to guide or monitor treatment for MRSA infections.   Urine culture     Status: None   Collection Time: 08/16/16  9:00 PM  Result Value Ref Range Status   Specimen Description URINE, CLEAN CATCH  Final   Special Requests NONE  Final   Culture NO GROWTH Performed at [facility]   Final   Report Status 08/18/2016 FINAL  Final      Radiology Studies: No results found.   Scheduled Meds: . aspirin  325 mg Oral Daily  . atorvastatin  40 mg Oral q1800  . bisacodyl  10 mg Rectal Once  . cefTRIAXone (ROCEPHIN)  IV  2 g Intravenous Q24H  . diltiazem  120 mg Oral Q12H  . enoxaparin (LOVENOX) injection  60 mg Subcutaneous Daily  . mouth rinse  15 mL Mouth Rinse BID  . metoprolol  50 mg Oral BID  . morphine  15 mg Oral Q12H  . multivitamin with minerals  1 tablet Oral Daily  . nystatin  5 mL Oral QID  . polyethylene glycol  17 g Oral BID  . protein supplement shake  11 oz Oral BID BM  . saccharomyces boulardii  250 mg Oral BID  . senna  2 tablet Oral QHS  . sertraline  50 mg Oral Daily  . sulfamethoxazole-trimethoprim  2 tablet Oral Q12H   Continuous Infusions:    LOS: 8 days    Time spent: 30 min    Janece Canterbury, MD [facility] Pager [phone]  If 7PM-7AM, please contact night-coverage [email or web address] Password [email or web address] 08/24/2016, 3:12 PM

## 2016-08-25 ENCOUNTER — Telehealth: Payer: Self-pay | Admitting: Cardiovascular Disease

## 2016-08-25 DIAGNOSIS — B9689 Other specified bacterial agents as the cause of diseases classified elsewhere: Secondary | ICD-10-CM

## 2016-08-25 DIAGNOSIS — I5033 Acute on chronic diastolic (congestive) heart failure: Secondary | ICD-10-CM

## 2016-08-25 DIAGNOSIS — I110 Hypertensive heart disease with heart failure: Secondary | ICD-10-CM

## 2016-08-25 DIAGNOSIS — Z96651 Presence of right artificial knee joint: Secondary | ICD-10-CM

## 2016-08-25 DIAGNOSIS — G8929 Other chronic pain: Secondary | ICD-10-CM

## 2016-08-25 DIAGNOSIS — Z885 Allergy status to narcotic agent status: Secondary | ICD-10-CM

## 2016-08-25 MED ORDER — ORITAVANCIN DIPHOSPHATE 400 MG IV SOLR
1200.0000 mg | Freq: Once | INTRAVENOUS | Status: AC
  Administered 2016-08-25: 1200 mg via INTRAVENOUS
  Filled 2016-08-25: qty 120

## 2016-08-25 MED ORDER — CEFTAROLINE FOSAMIL 600 MG IV SOLR
600.0000 mg | Freq: Two times a day (BID) | INTRAVENOUS | Status: DC
  Administered 2016-08-26: 600 mg via INTRAVENOUS
  Filled 2016-08-25 (×2): qty 600

## 2016-08-25 MED ORDER — DIPHENHYDRAMINE HCL 25 MG PO CAPS
25.0000 mg | ORAL_CAPSULE | ORAL | Status: DC | PRN
  Filled 2016-08-25: qty 1

## 2016-08-25 NOTE — Telephone Encounter (Signed)
New message   Pt daughter verbalized that she is calling to speak to the rn about the pt 3rd week hospital stay and some concerns that she has so many new medication and she don't want pt to go home under too much medication and pt is under hospice

## 2016-08-25 NOTE — Progress Notes (Signed)
Pharmacy Antibiotic Follow-up Note  Norma Ford is a 82 y.o. year-old female admitted on 08/15/2016.  The patient is currently on day 4 of Rocephin & day 1 of Vancomycin for RLE cellulitis. - Redness and swelling worsened today 10/13, marking margins daily. Add Vancomycin  Assessment/Plan:  Rocephin resumed, Ceftaroline begun per ID recs  Oritavancin 1200mg  x1 prior to discharge, attempted to schedule as outpatient, unable to be seen at Short Stay ([place]) or Sickle cell clinic.  Give Oritavancin as inpatient, then discharging home with Hospice.  Temp (24hrs), Avg:97.8 F (36.6 C), Min:97.7 F (36.5 C), Max:97.8 F (36.6 C)   Recent Labs Lab 08/20/16 1310  WBC 14.8*     Recent Labs Lab 08/19/16 0525 08/23/16 0351  CREATININE 0.54 0.48   Estimated Creatinine Clearance: 65.9 mL/min (by C-G formula based on SCr of 0.48 mg/dL).    Allergies  Allergen Reactions  . Codeine     REACTION: ITCHING   Antimicrobials this admission: 10/10 zosyn >> 10/10 10/10 vancomycin >> 10/10 10/10 rocephin >> 10/17, 10/18 >> PTA (10/6) fluconazole >> 10/18 10/13 Vancomycin >> 10/17 10/17 Bactrim >> 10/19 10/19 Teflaro per ID >> 10/19 Oritavancin x1 inpatient/unable to schedule dose as outpt  Levels/dose changes this admission:  Microbiology results: 10/9 BCx: NGTD 10/10 UCx: NG-final 10/10 MRSA PCR: negative  Thank you for allowing pharmacy to be a part of this patient's care.  Minda Ditto PharmD Pager [phone] 08/25/2016, 6:03 PM

## 2016-08-25 NOTE — Progress Notes (Signed)
PROGRESS NOTE  Norma Ford  [id number] DOB: [birth year] DOA: 08/15/2016 PCP: Ann Held, DO  Brief Narrative:  Norma Ford is an 82 y.o. female past medical history of morbid obesity, chronic right knee pain with history of knee replacement recently hospitalized for acute diastolic heart failure with atrial fibrillation and RVR and right great toe fracture, comes into the hospital for right lower extremity swelling and erythema and found to have a white count 35,000 in the ED.  She was treated for over a week with IV vancomycin and Rocephin before she started to show any signs of clinical improvement.  Attempted to transition to bactrim, but her erythema worsened.  Doubt gout as uric acid level 2.8.  ID consulted today.  If this is now felt to be due to venous stasis dermatitis and not infectious, could consider unna boot.    Assessment & Plan:   Principal Problem:   Cellulitis Active Problems:   Thrombocytopenia ([place])   Hypertensive heart disease with CHF ([place])   Osteoarthrosis involving lower leg   Chronic bronchitis (HCC)   Morbid obesity (HCC)   Paroxysmal atrial fibrillation (HCC)   Acute on chronic diastolic heart failure (HCC)   Cellulitis of right lower extremity   Chronic atrial fibrillation ([place])   Encounter for palliative care   Goals of care, counseling/discussion  Right lower extremity cellulitis.  Markedly worse erythema of the right lower extremity today. Also consider presence of foreign body (hardware in right knee appeared loosened on last XR.) -  Elevate right lower extremity -  Continue ceftriaxone -  Continue Bactrim -  ID consult > does hardware possibly need to be addressed while inpatient?  Still has knee mobility  Chronic pain with narcotic induced constipation -  Appreciate palliative care assistance -  Continue MS Contin 30 mg twice a day plus oral oxycodone for breakthrough pain. -  Continue MiraLAX and senna -  Enema  today  Thrombocytopenia: Improving likely due to infectious etiology. Lower extremity Doppler negative for DVT.  Hypertensive heart disease with chronic diastolic heart failure: Continue current regimen no changes were made.  Osteoarthrosis involving the lower leg: orthopedic surgeon was consulted recommended follow-up as an outpatient.  Morbid obesity (HCC) Counseling. -  D/c supplements to minimize medication list  Paroxysmal atrial fibrillation ([place]), previous a-fib with RVR now resolved and HR in 70-100s -  D/c diltiazem -  Continue metoprolol -  Consider discontinuing aspirin 325. -  D/c atorvastatin  Acute on chronic diastolic heart failure (HCC) Weight trending up, but blood pressures soft.    Oral thrush, resolving D/c nystatin Resume fluconazole or clotrimazole troches if plaques recur   DVT prophylaxis:  Lovenox Code Status:  DO NOT RESUSCITATE Family Communication:  Patient, her caretaker and her daughter Disposition Plan:  ID consultation.     Consultants:   Palliative care  Procedures:  None  Antimicrobials:  Anti-infectives    Start     Dose/Rate Route Frequency Ordered Stop   08/24/16 2200  sulfamethoxazole-trimethoprim (BACTRIM DS,SEPTRA DS) 800-160 MG per tablet 2 tablet     2 tablet Oral Every 12 hours 08/24/16 1204     08/24/16 1400  cefTRIAXone (ROCEPHIN) 2 g in dextrose 5 % 50 mL IVPB     2 g 100 mL/hr over 30 Minutes Intravenous Every 24 hours 08/24/16 1204     08/23/16 2200  sulfamethoxazole-trimethoprim (BACTRIM DS,SEPTRA DS) 800-160 MG per tablet 1 tablet  Status:  Discontinued  1 tablet Oral Every 12 hours 08/23/16 1004 08/24/16 1204   08/20/16 1000  vancomycin (VANCOCIN) 1,500 mg in sodium chloride 0.9 % 500 mL IVPB  Status:  Discontinued     1,500 mg 250 mL/hr over 120 Minutes Intravenous Every 24 hours 08/19/16 1111 08/23/16 1004   08/19/16 1000  vancomycin (VANCOCIN) 2,000 mg in sodium chloride 0.9 % 500 mL IVPB      2,000 mg 250 mL/hr over 120 Minutes Intravenous  Once 08/19/16 0929 08/19/16 1234   08/18/16 1100  fluconazole (DIFLUCAN) tablet 200 mg     200 mg Oral Daily 08/18/16 0953 08/24/16 1009   08/18/16 1000  fluconazole (DIFLUCAN) tablet 100 mg  Status:  Discontinued     100 mg Oral Daily 08/17/16 1025 08/18/16 0953   08/16/16 1000  fluconazole (DIFLUCAN) tablet 100 mg     100 mg Oral Daily 08/16/16 0628 08/17/16 0922   08/16/16 0800  piperacillin-tazobactam (ZOSYN) IVPB 3.375 g  Status:  Discontinued     3.375 g 12.5 mL/hr over 240 Minutes Intravenous Every 8 hours 08/16/16 0105 08/16/16 0407   08/16/16 0800  cefTRIAXone (ROCEPHIN) 2 g in dextrose 5 % 50 mL IVPB  Status:  Discontinued     2 g 100 mL/hr over 30 Minutes Intravenous Every 24 hours 08/16/16 0407 08/23/16 1004   08/16/16 0415  vancomycin (VANCOCIN) IVPB 1000 mg/200 mL premix  Status:  Discontinued     1,000 mg 200 mL/hr over 60 Minutes Intravenous NOW 08/16/16 0407 08/16/16 0422   08/16/16 0000  piperacillin-tazobactam (ZOSYN) IVPB 3.375 g     3.375 g 100 mL/hr over 30 Minutes Intravenous  Once 08/15/16 2355 08/16/16 0139   08/16/16 0000  vancomycin (VANCOCIN) IVPB 1000 mg/200 mL premix     1,000 mg 200 mL/hr over 60 Minutes Intravenous  Once 08/15/16 2355 08/16/16 0307       Subjective:  Worsening pain, redness of the RLE.  Would like to get OOB today.  Objective: Vitals:   08/24/16 2210 08/25/16 0637 08/25/16 0731 08/25/16 1156  BP: 113/80 129/79  103/65  Pulse: 88 (!) 105  (!) 101  Resp: 16 16    Temp: 97.7 F (36.5 C) 97.8 F (36.6 C)    TempSrc: Oral Axillary    SpO2: 95% 96%    Weight:   124.4 kg (274 lb 4 oz)   Height:        Intake/Output Summary (Last 24 hours) at 08/25/16 1410 Last data filed at 08/25/16 0941  Gross per 24 hour  Intake              290 ml  Output              150 ml  Net              140 ml   Filed Weights   08/24/16 0502 08/24/16 1848 08/25/16 0731  Weight: 122.1 kg (269 lb  2.9 oz) 124.9 kg (275 lb 5.7 oz) 124.4 kg (274 lb 4 oz)    Examination:  General exam:  Adult Female.  No acute distress.  HEENT:  NCAT, MMM Respiratory system: Clear to auscultation bilaterally Cardiovascular system: Regular rate and rhythm, normal S1/S2. No murmurs, rubs, gallops or clicks.  Warm extremities Gastrointestinal system: Normal active bowel sounds, soft, nondistended, nontender. MSK:  Normal tone and bulk, Bright pinkish red erythema extending circumferentially around the lower extremity spreading up to the knee.  Somewhat warm to touch and very tender  to palpation. Neuro:  Grossly moves all extremities but diffusely weak    Data Reviewed: I have personally reviewed following labs and imaging studies  CBC:  Recent Labs Lab 08/20/16 1310  WBC 14.8*  HGB 11.5*  HCT 35.2*  MCV 98.9  PLT [id number]*   Basic Metabolic Panel:  Recent Labs Lab 08/19/16 0525 08/23/16 0351  NA 138  --   K 5.1  --   CL 102  --   CO2 29  --   GLUCOSE 76  --   BUN 22*  --   CREATININE 0.54 0.48  CALCIUM 8.5*  --    GFR: Estimated Creatinine Clearance: 65.9 mL/min (by C-G formula based on SCr of 0.48 mg/dL). Liver Function Tests: No results for input(s): AST, ALT, ALKPHOS, BILITOT, PROT, ALBUMIN in the last 168 hours. No results for input(s): LIPASE, AMYLASE in the last 168 hours. No results for input(s): AMMONIA in the last 168 hours. Coagulation Profile: No results for input(s): INR, PROTIME in the last 168 hours. Cardiac Enzymes: No results for input(s): CKTOTAL, CKMB, CKMBINDEX, TROPONINI in the last 168 hours. BNP (last 3 results) No results for input(s): PROBNP in the last 8760 hours. HbA1C: No results for input(s): HGBA1C in the last 72 hours. CBG:  Recent Labs Lab 08/19/16 0714  GLUCAP 76   Lipid Profile: No results for input(s): CHOL, HDL, LDLCALC, TRIG, CHOLHDL, LDLDIRECT in the last 72 hours. Thyroid Function Tests: No results for input(s): TSH, T4TOTAL, FREET4,  T3FREE, THYROIDAB in the last 72 hours. Anemia Panel: No results for input(s): VITAMINB12, FOLATE, FERRITIN, TIBC, IRON, RETICCTPCT in the last 72 hours. Urine analysis:    Component Value Date/Time   COLORURINE YELLOW 08/16/2016 2100   APPEARANCEUR CLEAR 08/16/2016 2100   LABSPEC 1.019 08/16/2016 2100   PHURINE 6.5 08/16/2016 2100   GLUCOSEU NEGATIVE 08/16/2016 2100   HGBUR NEGATIVE 08/16/2016 2100   HGBUR negative 09/23/2010 0825   BILIRUBINUR NEGATIVE 08/16/2016 2100   BILIRUBINUR Neg 02/11/2016 [place] 08/16/2016 2100   PROTEINUR NEGATIVE 08/16/2016 2100   UROBILINOGEN 2.0 02/11/2016 1150   UROBILINOGEN negative 09/23/2010 0825   NITRITE NEGATIVE 08/16/2016 2100   LEUKOCYTESUR MODERATE (A) 08/16/2016 2100   Sepsis Labs: @LABRCNTIP (procalcitonin:4,lacticidven:4)  ) Recent Results (from the past 240 hour(s))  Blood Culture (routine x 2)     Status: None   Collection Time: 08/15/16 11:23 PM  Result Value Ref Range Status   Specimen Description BLOOD RIGHT FOREARM  Final   Special Requests BOTTLES DRAWN AEROBIC AND ANAEROBIC 5CC  Final   Culture   Final    NO GROWTH 5 DAYS Performed at [facility]    Report Status 08/21/2016 FINAL  Final  Blood Culture (routine x 2)     Status: None   Collection Time: 08/16/16 12:41 AM  Result Value Ref Range Status   Specimen Description BLOOD RIGHT ANTECUBITAL  Final   Special Requests BOTTLES DRAWN AEROBIC AND ANAEROBIC 5CC EACH  Final   Culture   Final    NO GROWTH 5 DAYS Performed at [facility]    Report Status 08/21/2016 FINAL  Final  MRSA PCR Screening     Status: None   Collection Time: 08/16/16  5:07 AM  Result Value Ref Range Status   MRSA by PCR NEGATIVE NEGATIVE Final    Comment:        The GeneXpert MRSA Assay (FDA approved for NASAL specimens only), is one component of a comprehensive MRSA  colonization surveillance program. It is not intended to diagnose MRSA infection nor to  guide or monitor treatment for MRSA infections.   Urine culture     Status: None   Collection Time: 08/16/16  9:00 PM  Result Value Ref Range Status   Specimen Description URINE, CLEAN CATCH  Final   Special Requests NONE  Final   Culture NO GROWTH Performed at [facility]   Final   Report Status 08/18/2016 FINAL  Final      Radiology Studies: No results found.   Scheduled Meds: . aspirin  325 mg Oral Daily  . cefTRIAXone (ROCEPHIN)  IV  2 g Intravenous Q24H  . enoxaparin (LOVENOX) injection  60 mg Subcutaneous Daily  . mouth rinse  15 mL Mouth Rinse BID  . metoprolol  50 mg Oral BID  . morphine  15 mg Oral Q12H  . polyethylene glycol  17 g Oral BID  . protein supplement shake  11 oz Oral BID BM  . saccharomyces boulardii  250 mg Oral BID  . senna  2 tablet Oral QHS  . sertraline  50 mg Oral Daily  . sulfamethoxazole-trimethoprim  2 tablet Oral Q12H   Continuous Infusions:    LOS: 9 days    Time spent: 30 min    Janece Canterbury, MD [facility] Pager [phone]  If 7PM-7AM, please contact night-coverage [email or web address] Password [email or web address] 08/25/2016, 2:10 PM

## 2016-08-25 NOTE — Consult Note (Addendum)
[facility] for Infectious Disease       Reason for Consult: cellulitis    Referring Physician: Dr. Sheran Fava  Principal Problem:   Cellulitis Active Problems:   Thrombocytopenia ([place])   Hypertensive heart disease with CHF ([place])   Osteoarthrosis involving lower leg   Chronic bronchitis (HCC)   Morbid obesity (HCC)   Paroxysmal atrial fibrillation (HCC)   Acute on chronic diastolic heart failure (HCC)   Cellulitis of right lower extremity   Chronic atrial fibrillation ([place])   Encounter for palliative care   Goals of care, counseling/discussion   . aspirin  325 mg Oral Daily  . cefTRIAXone (ROCEPHIN)  IV  2 g Intravenous Q24H  . enoxaparin (LOVENOX) injection  60 mg Subcutaneous Daily  . mouth rinse  15 mL Mouth Rinse BID  . metoprolol  50 mg Oral BID  . morphine  15 mg Oral Q12H  . polyethylene glycol  17 g Oral BID  . protein supplement shake  11 oz Oral BID BM  . saccharomyces boulardii  250 mg Oral BID  . senna  2 tablet Oral QHS  . sertraline  50 mg Oral Daily  . sulfamethoxazole-trimethoprim  2 tablet Oral Q12H    Recommendations: Start ceftaroline while inpatient  Give one dose of oritavancin right after discharge at [facility] short stay or sickle cell clinic - this cannot be given inpatient so needs to be done via outpatient infusion center but can be done on her way out.  This can help her facilitate D/C today or tomorrow.   Orthopedic followup after discharge for knee  Assessment: She has had cellulitis and was improving but worse again.  It is warm, quite tender and c/w cellulitis.   Hardware loosening of right knee.     Antibiotics: Vancomycin and ceftriaxone  HPI: Norma Ford is a 82 y.o. female with HTN, history of right knee replacement with revision and chronic knee pain, morbid obesity who came in 10/10 with leg pain, redness and fever.  Initial WBC of 35,000 and admtted for cellulitis on vancomycin and ceftriaxone.  She initially improved and was  to go home with hospice today on Bactrim but erythema and pain, swelling all worsened.  WBC improved to 14.8 on 10/14.  Blood culture x 2 remained negative.  Xray of knee independently reviewed and area with tilt noted  Review of Systems:  Constitutional: negative for fevers and chills Gastrointestinal: negative for diarrhea All other systems reviewed and are negative   Past Medical History:  Diagnosis Date  . Adrenal tumor 2002   right, followed by Surgery  . Barrett's esophagus 1999  . Cataracts, bilateral   . DECUBITUS ULCER, BUTTOCK 12/21/2009  . History of abnormal mammogram    2001-h/o abnormal mammo-told just calcium deposit-repeat  . HTN (hypertension)   . Hyperlipidemia   . Kidney stone   . MYCOSIS FUNGOIDES LYMPH NODES MULTIPLE SITES 09/21/2009  . THROMBOCYTOPENIA 12/21/2009    Social History  Substance Use Topics  . Smoking status: Never Smoker  . Smokeless tobacco: Never Used  . Alcohol use No    Family History  Problem Relation Age of Onset  . Heart disease Mother   . Heart disease Father     pacemaker  . Kidney disease Father     Allergies  Allergen Reactions  . Codeine     REACTION: ITCHING    Physical Exam: Constitutional: in no apparent distress and alert  Vitals:   08/25/16 0637 08/25/16 1156  BP: 129/79 103/65  Pulse: (!) 105 (!) 101  Resp: 16   Temp: 97.8 F (36.6 C)    EYES: anicteric ENMT: no thrush Cardiovascular: Cor RRR Respiratory: CTA B; normal respiratory effort GI: Bowel sounds are normal, liver is not enlarged, spleen is not enlarged Musculoskeletal: right leg with increased erythema compared to markings, warmth, tenderness, no fluctuance Skin: negatives: no rash Hematologic: no cervical lad  Lab Results  Component Value Date   WBC 14.8 (H) 08/20/2016   HGB 11.5 (L) 08/20/2016   HCT 35.2 (L) 08/20/2016   MCV 98.9 08/20/2016   PLT 139 (L) 08/20/2016    Lab Results  Component Value Date   CREATININE 0.48 08/23/2016    BUN 22 (H) 08/19/2016   NA 138 08/19/2016   K 5.1 08/19/2016   CL 102 08/19/2016   CO2 29 08/19/2016    Lab Results  Component Value Date   ALT 65 (H) 08/15/2016   AST 19 08/15/2016   ALKPHOS 56 08/15/2016     Microbiology: Recent Results (from the past 240 hour(s))  Blood Culture (routine x 2)     Status: None   Collection Time: 08/15/16 11:23 PM  Result Value Ref Range Status   Specimen Description BLOOD RIGHT FOREARM  Final   Special Requests BOTTLES DRAWN AEROBIC AND ANAEROBIC 5CC  Final   Culture   Final    NO GROWTH 5 DAYS Performed at [facility]    Report Status 08/21/2016 FINAL  Final  Blood Culture (routine x 2)     Status: None   Collection Time: 08/16/16 12:41 AM  Result Value Ref Range Status   Specimen Description BLOOD RIGHT ANTECUBITAL  Final   Special Requests BOTTLES DRAWN AEROBIC AND ANAEROBIC 5CC EACH  Final   Culture   Final    NO GROWTH 5 DAYS Performed at [facility]    Report Status 08/21/2016 FINAL  Final  MRSA PCR Screening     Status: None   Collection Time: 08/16/16  5:07 AM  Result Value Ref Range Status   MRSA by PCR NEGATIVE NEGATIVE Final    Comment:        The GeneXpert MRSA Assay (FDA approved for NASAL specimens only), is one component of a comprehensive MRSA colonization surveillance program. It is not intended to diagnose MRSA infection nor to guide or monitor treatment for MRSA infections.   Urine culture     Status: None   Collection Time: 08/16/16  9:00 PM  Result Value Ref Range Status   Specimen Description URINE, CLEAN CATCH  Final   Special Requests NONE  Final   Culture NO GROWTH Performed at [facility]   Final   Report Status 08/18/2016 FINAL  Final    Scharlene Gloss, [email or web address] [phone] pager  [phone] cell 08/25/2016, 3:17 PM

## 2016-08-25 NOTE — Progress Notes (Signed)
Pt discharging with [place].

## 2016-08-26 LAB — BASIC METABOLIC PANEL
ANION GAP: 6 (ref 5–15)
BUN: 11 mg/dL (ref 6–20)
CHLORIDE: 98 mmol/L — AB (ref 101–111)
CO2: 29 mmol/L (ref 22–32)
Calcium: 8.3 mg/dL — ABNORMAL LOW (ref 8.9–10.3)
Creatinine, Ser: 0.65 mg/dL (ref 0.44–1.00)
Glucose, Bld: 86 mg/dL (ref 65–99)
POTASSIUM: 4.5 mmol/L (ref 3.5–5.1)
SODIUM: 133 mmol/L — AB (ref 135–145)

## 2016-08-26 LAB — CBC
HEMATOCRIT: 31.2 % — AB (ref 36.0–46.0)
Hemoglobin: 10 g/dL — ABNORMAL LOW (ref 12.0–15.0)
MCH: 31.2 pg (ref 26.0–34.0)
MCHC: 32.1 g/dL (ref 30.0–36.0)
MCV: 97.2 fL (ref 78.0–100.0)
Platelets: 248 10*3/uL (ref 150–400)
RBC: 3.21 MIL/uL — AB (ref 3.87–5.11)
RDW: 14.9 % (ref 11.5–15.5)
WBC: 8.1 10*3/uL (ref 4.0–10.5)

## 2016-08-26 MED ORDER — POLYETHYLENE GLYCOL 3350 17 GM/SCOOP PO POWD: 17.0000 g | Freq: Two times a day (BID) | ORAL | 3 refills | Status: AC

## 2016-08-26 MED ORDER — SENNA 8.6 MG PO TABS: 2.0000 | ORAL_TABLET | Freq: Every day | ORAL | 0 refills | Status: AC

## 2016-08-26 MED ORDER — BISACODYL 10 MG RE SUPP: 10.0000 mg | Freq: Every day | RECTAL | 0 refills | Status: AC | PRN

## 2016-08-26 MED ORDER — HYDROXYZINE HCL 10 MG/5ML PO SYRP
10.0000 mg | ORAL_SOLUTION | Freq: Three times a day (TID) | ORAL | Status: DC | PRN
  Administered 2016-08-26: 10 mg via ORAL
  Filled 2016-08-26 (×2): qty 5

## 2016-08-26 MED ORDER — MORPHINE SULFATE ER 15 MG PO TBCR: 15.0000 mg | EXTENDED_RELEASE_TABLET | Freq: Two times a day (BID) | ORAL | 0 refills | Status: AC

## 2016-08-26 MED ORDER — OXYCODONE HCL 10 MG PO TABS: 10.0000 mg | ORAL_TABLET | ORAL | 0 refills | Status: AC | PRN

## 2016-08-26 MED ORDER — ONDANSETRON HCL 4 MG PO TABS: 4.0000 mg | ORAL_TABLET | Freq: Four times a day (QID) | ORAL | 0 refills | Status: AC | PRN

## 2016-08-26 MED FILL — oxyCODONE HCL 10 MG TABS: 10 | 3 days supply | Qty: 30 | Fill #0

## 2016-08-26 MED FILL — POLYETHYLENE GLYCOL 3350 PO: 16 days supply | Qty: 527 | Fill #0

## 2016-08-26 MED FILL — SM SENNA LAXATIVE 8.6 MG TA: 8.6 MG | 50 days supply | Qty: 100 | Fill #0

## 2016-08-26 MED FILL — BISCOLAX 10 MG SUPPOSITORY: 10 | 24 days supply | Qty: 96 | Fill #0

## 2016-08-26 MED FILL — ONDANSETRON HCL 4 MG TABLET: 4 | 5 days supply | Qty: 20 | Fill #0

## 2016-08-26 MED FILL — MORPHINE SULF ER 15 MG TAB: 15 | 30 days supply | Qty: 60 | Fill #0

## 2016-08-26 NOTE — Telephone Encounter (Signed)
I reviewed patient's chart and do not see documentation of Dalbert Garnet listed on DPR. She is listed as an alternate on patient's Advance Directive POA.  I left a message for patient's son, Herbie [place], who is listed on DPR to call back with questions.

## 2016-08-26 NOTE — Care Management Note (Signed)
Case Management Note  Patient Details  Name: Norma Ford MRN: [id number] Date of Birth: 07/22/1930  Subjective/Objective: [facility] spoke to [facility]-informed her of [facility] already following-patient for d/c home w/[facility] services.Faxed w/confirmation to [phone] d/c summary. [facility] forms for ambulance transp home-confirmed address-forms in shadow chart-Nsg to call [facility] when ready. No further CM needs.                  Action/Plan:d/c home w/hospice   Expected Discharge Date:                  Expected Discharge Plan:  [place] ([facility])  In-House Referral:     Discharge planning Services  CM Consult  Post Acute Care Choice:  Durable Medical Equipment, Home Health Choice offered to:  Patient  DME Arranged:    DME Agency:     HH Arranged:  RN [place] Agency:  Hospice and [place] of Service:     If discussed at [employer], dates discussed:    Additional Comments:  Dessa Phi, RN 08/26/2016, 3:17 PM

## 2016-08-26 NOTE — Progress Notes (Signed)
Patient discharge to home. D/c instructions and follow up appointment discussed with daughter and daughter in law verbalized understanding. Discharged with foley catheter. [facility] called for transport.

## 2016-08-26 NOTE — Discharge Summary (Addendum)
Physician Discharge Summary  LEKEYA ROLLINGS JJO:[id number] DOB: 08-09-1930 DOA: 08/15/2016  PCP: Ann Held, DO  Admit date: 08/15/2016 Discharge date: 08/26/2016  Admitted From: home  Disposition:  Home with hospice  Recommendations for Outpatient Follow-up:  1. Repeat BMP in 1 week to determine if potassium and creatinine have remained stable.   2. Cardiology follow up if needed regarding heart failure and atrial fibrillation 3. Orthopedic surgery, Dr. Sharol Given for evaluation of right knee 4. Infectious disease if cellulitis does not improve  Home Health:  Family have requested private aids  Equipment/Devices:  Delivered by hospice  Discharge Condition:  Stable, improved CODE STATUS:  DNR  Diet recommendation:  regular   Brief/Interim Summary:  AMIYAH Ford an 82 y.o.femalepast medical history of morbid obesity, chronic right knee pain with history of knee replacement recently hospitalized for acute diastolic heart failure with atrial fibrillation and RVR and right great toe fracture, comes into the hospital for right lower extremity swelling and erythema and found to have a white count 35,000 in the ED.  Lower extremity Doppler negative for DVT.  She was treated for over a week with IV vancomycin and Rocephin before she started to show any signs of clinical improvement.  Attempted to transition to bactrim, but her erythema worsened.  ID was consulted and assisted with oritavancin infusion which was administered on 10/19.  Patient's erythema stabilized but did not resolve.  She will need orthopedics follow up regarding her right knee hardware.  She met with palliative care and elected hospice and comfort measures.  Her medication list was simplified to focus on comfort and was discussed with cardiology prior to discharge.    Discharge Diagnoses:  Principal Problem:   Cellulitis Active Problems:   Thrombocytopenia ([place])   Hypertensive heart disease with CHF ([place])    Osteoarthrosis involving lower leg   Chronic bronchitis ([facility])   Morbid obesity ([facility])   Paroxysmal atrial fibrillation ([facility])   Acute on chronic diastolic heart failure (H[facility])   Cellulitis of right lower extremity   Chronic atrial fibrillation ([place])   Encounter for palliative care   Goals of care, counseling/discussion  Right lower extremity cellulitis.   -  Elevate right lower extremity -  Oritavancin administered on 10/19  Chronic pain with narcotic induced constipation -  Appreciate palliative care assistance -  Continue MS Contin 30 mg twice a day plus oral oxycodone for breakthrough pain. -  Continued MiraLAX and senna -  Recommend 1-4 bisacodyl suppositories daily to treat constipation  Thrombocytopenia, likely due to severe infection and resolved.    Hypertensive heart disease with acute on chronic diastolic heart failure at the time of admission that was managed with lasix.  Due to concerns for sepsis and hypotension, her lasix was discontinued.  As she recovered from her cellulitis, however, she started to gain weight and her lasix was resumed at the time of discharge.    Osteoarthrosis involving the lower leg: orthopedic surgeon, Dr. Sharol Given, recommended follow-up as an outpatient.  Morbid obesity ([place]) -  D/c supplements to minimize medication list  Paroxysmal atrial fibrillation ([place]), previous a-fib with RVR now resolved and HR in 70-100s -  D/c diltiazem -  Continue metoprolol -  D/C aspirin 325. -  D/c atorvastatin  Oral thrush, resolved Received fluconazole and nystatin during hospitalization   Discharge Instructions  Discharge Instructions    (HEART FAILURE PATIENTS) Call MD:  Anytime you have any of the following symptoms: 1) 3 pound weight gain  in 24 hours or 5 pounds in 1 week 2) shortness of breath, with or without a dry hacking cough 3) swelling in the hands, feet or stomach 4) if you have to sleep on extra pillows at night in order to breathe.     Complete by:  As directed    AMB Referral to [facility] Care Management    Complete by:  As directed    Please assign to [facility] RNCM. Recent readmission. Multiple co-morbidities. Daughter in law to be contacted for post transition of care calls- Norma Ford at [phone]. Written consent obtained. Please call with questions. Currently at [facility]. Thanks. Raiford Noble, MSN-Ed, RN,BSN-[facility]N [facility] Liaison-[phone]   Reason for consult:  Please assign to [facility] RNCM   Diagnoses of:  Heart Failure   Expected date of contact:  1-3 days (reserved for hospital discharges)   Call MD for:  difficulty breathing, headache or visual disturbances    Complete by:  As directed    Call MD for:  extreme fatigue    Complete by:  As directed    Call MD for:  hives    Complete by:  As directed    Call MD for:  persistant dizziness or light-headedness    Complete by:  As directed    Call MD for:  persistant nausea and vomiting    Complete by:  As directed    Call MD for:  severe uncontrolled pain    Complete by:  As directed    Call MD for:  temperature >100.4    Complete by:  As directed    Diet general    Complete by:  As directed    Increase activity slowly    Complete by:  As directed        Medication List    STOP taking these medications   aspirin 325 MG EC tablet   diltiazem 120 MG 12 hr capsule Commonly known as:  CARDIZEM SR   FISH OIL PO   fluconazole 100 MG tablet Commonly known as:  DIFLUCAN   HYDROcodone-acetaminophen 5-325 MG tablet Commonly known as:  NORCO/VICODIN   multivitamin tablet   predniSONE 20 MG tablet Commonly known as:  DELTASONE   simvastatin 80 MG tablet Commonly known as:  ZOCOR     TAKE these medications   ALPRAZolam 0.25 MG tablet Commonly known as:  XANAX Take 1 tablet (0.25 mg total) by mouth 2 (two) times daily as needed for anxiety.   bisacodyl 10 MG suppository Commonly known as:  DULCOLAX Place 1-4  suppositories (10-40 mg total) rectally daily as needed for mild constipation.   furosemide 40 MG tablet Commonly known as:  LASIX Take 1 tablet (40 mg total) by mouth daily. What changed:  how much to take   metoprolol 50 MG tablet Commonly known as:  LOPRESSOR Take 1 tablet (50 mg total) by mouth 2 (two) times daily.   mometasone 50 MCG/ACT nasal spray Commonly known as:  NASONEX Place 2 sprays into the nose daily as needed (for congestion).   morphine 15 MG 12 hr tablet Commonly known as:  MS CONTIN Take 1 tablet (15 mg total) by mouth every 12 (twelve) hours.   ondansetron 4 MG tablet Commonly known as:  ZOFRAN Take 1 tablet (4 mg total) by mouth every 6 (six) hours as needed for nausea.   Oxycodone HCl 10 MG Tabs Take 1 tablet (10 mg total) by mouth every 2 (two) hours as needed  for moderate pain.   polyethylene glycol powder powder Commonly known as:  GLYCOLAX/MIRALAX Take 17 g by mouth 2 (two) times daily. What changed:  when to take this  reasons to take this   potassium chloride SA 20 MEQ tablet Commonly known as:  K-DUR,KLOR-CON Take 1 tablet (20 mEq total) by mouth daily. What changed:  how much to take   saccharomyces boulardii 250 MG capsule Commonly known as:  FLORASTOR Take 250 mg by mouth 2 (two) times daily.   senna 8.6 MG Tabs tablet Commonly known as:  SENOKOT Take 2 tablets (17.2 mg total) by mouth at bedtime.   sertraline 50 MG tablet Commonly known as:  ZOLOFT Take 1 tablet by mouth  daily What changed:  See the new instructions.      Follow-up Information    Lelon Perla Chase, DO .   Specialty:  Family Medicine Contact information: [address] [place] [address] [phone]          Allergies  Allergen Reactions  . Codeine     REACTION: ITCHING    Consultations:  Cardiology, Dr. Elease Hashimoto Orthopedic surgery, Dr. Lajoyce Corners Palliative care, Dr. Neale Burly Infectious disease, Dr.  Luciana Axe  Procedures/Studies: Dg Chest Port 1 View  Result Date: 08/16/2016 CLINICAL DATA:  Shortness of breath for days, increased. EXAM: PORTABLE CHEST 1 VIEW COMPARISON:  Radiographs 08/04/2016 FINDINGS: Low lung volumes persist. Improved pulmonary edema from prior. Left lung base and midlung zone atelectasis. Probable small left pleural effusion. Cardiomegaly and mediastinal contours are stable. Chronic change about both shoulders. IMPRESSION: Improved pulmonary edema. Left lung atelectasis. Probable left pleural effusion, similar to prior. Electronically Signed   By: Rubye Oaks M.D.   On: 08/16/2016 01:22   Dg Chest Port 1 View  Result Date: 08/04/2016 CLINICAL DATA:  F/u pneumonia; sob and wheezing EXAM: PORTABLE CHEST 1 VIEW COMPARISON:  08/01/2016 FINDINGS: Exam is lordotic. Extremely low lung volumes. There is increased perihilar vascular congestion. New perihilar airspace disease. No pneumothorax IMPRESSION: New perihilar airspace disease suggests pulmonary edema. Low lung volumes the Electronically Signed   By: Genevive Bi M.D.   On: 08/04/2016 08:28   Dg Chest Portable 1 View  Result Date: 08/01/2016 CLINICAL DATA:  Generalized weakness for 2 weeks. Fell at home tonight. Leg pain. History of hypertension, hyperlipidemia. EXAM: PORTABLE CHEST 1 VIEW COMPARISON:  Chest radiograph November 10, 2014 FINDINGS: Cardiac silhouette is mildly enlarged, even with CAD considerations low inspiratory examination. Pulmonary vascular congestion interstitial prominence. Small bilateral pleural effusions. No pneumothorax. Severe degenerative change of the shoulders. IMPRESSION: Mild cardiomegaly.  Interstitial edema with small pleural effusions. Electronically Signed   By: Awilda Metro M.D.   On: 08/01/2016 01:51   Dg Knee Right Port  Result Date: 08/16/2016 CLINICAL DATA:  Chronic right leg swelling, with acute onset of erythema and pain. EXAM: PORTABLE RIGHT KNEE - 1-2 VIEW COMPARISON:   None. FINDINGS: There appears to be loosening of the tibial component of the patient's total knee arthroplasty, with lateral and anterior tilt of the component. The femoral component is grossly unremarkable in appearance. There is no evidence of fracture. No knee joint effusion is seen. Diffuse soft tissue swelling is noted about the knee. IMPRESSION: 1. Apparent loosening of the tibial component of the total knee arthroplasty, with lateral and anterior tilt of the component. Would correlate clinically. 2. Diffuse soft tissue swelling about the knee. Electronically Signed   By: Roanna Raider M.D.   On:  08/16/2016 01:28   Dg Foot Complete Right  Result Date: 08/01/2016 CLINICAL DATA:  Acute onset of right foot pain, especially at the great toe, after fall. Initial encounter. EXAM: RIGHT FOOT COMPLETE - 3+ VIEW COMPARISON:  None. FINDINGS: There is a comminuted fracture involving the first proximal phalanx, with 2 fracture lines extending to the first metatarsophalangeal joint, and mild impaction of a central fragment. No additional fractures are seen. There is no evidence of talar subluxation; the subtalar joint is unremarkable in appearance. A plantar calcaneal spur is noted. Diffuse dorsal soft tissue swelling is noted at the foot, with scattered soft tissue calcifications. IMPRESSION: 1. Comminuted fracture of the first proximal phalanx, with 2 fracture lines extending to the first metatarsophalangeal joint, and mild impaction of a central fragment. 2. Diffuse dorsal soft tissue swelling, with scattered soft tissue calcifications. Electronically Signed   By: Garald Balding M.D.   On: 08/01/2016 01:01   Dg Femur Min 2 Views Left  Result Date: 08/01/2016 CLINICAL DATA:  82 year old female with bilateral leg pain and fall. EXAM: LEFT FEMUR 2 VIEWS COMPARISON:  Right femur radiograph dated 07/31/2016 FINDINGS: There is no acute fracture or dislocation. The bones are osteopenic. There is a total left knee  arthroplasty. There are osteoarthritic changes of the left hip. There is spurring of the superior patella along the insertion of the quadriceps tendon. No significant joint effusion. The soft tissues are grossly unremarkable. IMPRESSION: No acute fracture or dislocation. Electronically Signed   By: Anner Crete M.D.   On: 08/01/2016 00:59   Dg Femur, Min 2 Views Right  Result Date: 08/01/2016 CLINICAL DATA:  Status post fall, with bilateral femur pain. Initial encounter. EXAM: RIGHT FEMUR 2 VIEWS COMPARISON:  None. FINDINGS: There is no evidence of fracture or dislocation. The right femur appears grossly intact. The right hip joint is grossly unremarkable. The patient's right knee arthroplasty is grossly unremarkable in appearance, without evidence of loosening. No definite soft tissue abnormalities are characterized on radiograph. No knee joint effusion is seen. IMPRESSION: No evidence of fracture or dislocation. Right knee arthroplasty is unremarkable in appearance. Electronically Signed   By: Garald Balding M.D.   On: 08/01/2016 01:03   Subjective: Still having leg discomfort, but breathing seems to be somewhat better.  Would like to have foley left in place at discharge.    Discharge Exam: Vitals:   08/25/16 2115 08/26/16 0551  BP: (!) 130/52 119/65  Pulse: 82 86  Resp: 15   Temp: 98.1 F (36.7 C) 98.4 F (36.9 C)   Vitals:   08/25/16 0731 08/25/16 1156 08/25/16 2115 08/26/16 0551  BP:  103/65 (!) 130/52 119/65  Pulse:  (!) 101 82 86  Resp:   15   Temp:   98.1 F (36.7 C) 98.4 F (36.9 C)  TempSrc:   Oral Oral  SpO2:   95% 96%  Weight: 124.4 kg (274 lb 4 oz)   126.4 kg (278 lb 10.6 oz)  Height:        General exam:  Adult Female.  No acute distress.  HEENT:  NCAT, MMM Respiratory system: Clear to auscultation bilaterally Cardiovascular system: Regular rate and rhythm, normal S1/S2. No murmurs, rubs, gallops or clicks.  Warm extremities Gastrointestinal system: Normal  active bowel sounds, soft, nondistended, nontender. MSK:  Normal tone and bulk, Bright pinkish red erythema extending circumferentially around the lower extremity spreading up to the knee.  Somewhat warm to touch and tender to palpation. Neuro:  Grossly moves  all extremities but diffusely weak     The results of significant diagnostics from this hospitalization (including imaging, microbiology, ancillary and laboratory) are listed below for reference.     Microbiology: Recent Results (from the past 240 hour(s))  Urine culture     Status: None   Collection Time: 08/16/16  9:00 PM  Result Value Ref Range Status   Specimen Description URINE, CLEAN CATCH  Final   Special Requests NONE  Final   Culture NO GROWTH Performed at [facility]   Final   Report Status 08/18/2016 FINAL  Final     Labs: BNP (last 3 results)  Recent Labs  08/01/16 0418 08/15/16 2326  BNP 177.2* 354.6*   Basic Metabolic Panel:  Recent Labs Lab 08/23/16 0351 08/26/16 0423  NA  --  133*  K  --  4.5  CL  --  98*  CO2  --  29  GLUCOSE  --  86  BUN  --  11  CREATININE 0.48 0.65  CALCIUM  --  8.3*   Liver Function Tests: No results for input(s): AST, ALT, ALKPHOS, BILITOT, PROT, ALBUMIN in the last 168 hours. No results for input(s): LIPASE, AMYLASE in the last 168 hours. No results for input(s): AMMONIA in the last 168 hours. CBC:  Recent Labs Lab 08/20/16 1310 08/26/16 0423  WBC 14.8* 8.1  HGB 11.5* 10.0*  HCT 35.2* 31.2*  MCV 98.9 97.2  PLT 139* 248   Cardiac Enzymes: No results for input(s): CKTOTAL, CKMB, CKMBINDEX, TROPONINI in the last 168 hours. BNP: Invalid input(s): POCBNP CBG: No results for input(s): GLUCAP in the last 168 hours. D-Dimer No results for input(s): DDIMER in the last 72 hours. Hgb A1c No results for input(s): HGBA1C in the last 72 hours. Lipid Profile No results for input(s): CHOL, HDL, LDLCALC, TRIG, CHOLHDL, LDLDIRECT in the last 72  hours. Thyroid function studies No results for input(s): TSH, T4TOTAL, T3FREE, THYROIDAB in the last 72 hours.  Invalid input(s): FREET3 Anemia work up No results for input(s): VITAMINB12, FOLATE, FERRITIN, TIBC, IRON, RETICCTPCT in the last 72 hours. Urinalysis    Component Value Date/Time   COLORURINE YELLOW 08/16/2016 2100   APPEARANCEUR CLEAR 08/16/2016 2100   LABSPEC 1.019 08/16/2016 2100   PHURINE 6.5 08/16/2016 2100   GLUCOSEU NEGATIVE 08/16/2016 2100   HGBUR NEGATIVE 08/16/2016 2100   HGBUR negative 09/23/2010 0825   BILIRUBINUR NEGATIVE 08/16/2016 2100   BILIRUBINUR Neg 02/11/2016 [place] 08/16/2016 2100   PROTEINUR NEGATIVE 08/16/2016 2100   UROBILINOGEN 2.0 02/11/2016 1150   UROBILINOGEN negative 09/23/2010 0825   NITRITE NEGATIVE 08/16/2016 2100   LEUKOCYTESUR MODERATE (A) 08/16/2016 2100   Sepsis Labs Invalid input(s): PROCALCITONIN,  WBC,  LACTICIDVEN   Time coordinating discharge: Over 30 minutes  SIGNED:   Janece Canterbury, MD  [facility] 08/26/2016, 11:59 AM Pager   If 7PM-7AM, please contact night-coverage [email or web address] Password [email or web address]

## 2016-08-27 ENCOUNTER — Encounter: Payer: Self-pay | Admitting: Internal Medicine

## 2016-08-29 ENCOUNTER — Ambulatory Visit: Payer: Medicare Other | Admitting: Family Medicine

## 2016-08-29 ENCOUNTER — Telehealth: Payer: Self-pay | Admitting: *Deleted

## 2016-08-29 NOTE — Telephone Encounter (Signed)
Patient has an appointment tomorrow 10/24 with Dr. Acie Fredrickson

## 2016-08-29 NOTE — Telephone Encounter (Signed)
[employer] note received via fax  Call:   Date: 08/26/16 Time: 1908   Caller: Mycala Orfield, daughter Return number: [phone]  [phone]  Nurse: Willey Blade, RN  Chief Complaint: Prescription Refill or Medication Request (non symptomatic)  Reason for call: She got home from hospital. x4 wks in there FX toe a tfirst. After fall then ? Heart problem. Pneumonia. That resolved. She was in [facility] living DX cellulitis in RT leg had to go back to hospital. Could not get cellulitis healed. Dtr has 13 meds two are not filled: metoprolol 50mg  and Florastor.   Related visit to physician within the last 2 weeks: N/A  Guideline: N/A  Disposition: Clinical Call  RN called the 4th floor to have the discharing nurse call in the two meds the mother of the pt needs. Caller verbalized understanding and call back for any changes or if she gets worse. The nurse on the 4th is paging the MD to call in the medications.

## 2016-08-30 ENCOUNTER — Ambulatory Visit: Payer: Medicare Other | Admitting: Cardiovascular Disease

## 2016-08-31 ENCOUNTER — Institutional Professional Consult (permissible substitution): Payer: Medicare Other | Admitting: Neurology

## 2016-09-06 ENCOUNTER — Telehealth: Payer: Self-pay | Admitting: Family Medicine

## 2016-09-06 NOTE — Telephone Encounter (Signed)
Norma Ford dropped off death certificate . Forwarded to PCP

## 2016-09-07 DEATH — deceased

## 2016-09-08 NOTE — Telephone Encounter (Signed)
Representative called in to follow up on death certificate dropped off. She would like to be called whenever it is ready.

## 2016-09-09 ENCOUNTER — Telehealth: Payer: Self-pay | Admitting: Family Medicine

## 2016-09-09 ENCOUNTER — Telehealth (INDEPENDENT_AMBULATORY_CARE_PROVIDER_SITE_OTHER): Payer: Self-pay | Admitting: Orthopedic Surgery

## 2016-09-09 NOTE — Telephone Encounter (Signed)
[name]: Nakina Boakye   Relation to pt: daughter  Call back number:[phone]  Reason for call:  Hallam,Lois M daughter informed patient passed away 02-20-23 09-19-2016.

## 2016-09-09 NOTE — Telephone Encounter (Signed)
I filled it out and left it on Ebonys desk Tuesday

## 2016-09-09 NOTE — Telephone Encounter (Signed)
Representative called in to follow up on death certificate dropped off. She would like to be called whenever it is ready. Please advise.

## 2016-09-09 NOTE — Telephone Encounter (Signed)
Spoke with supervisor Charlena Cross) she has called funeral home to make them aware for pick up.

## 2016-09-20 ENCOUNTER — Ambulatory Visit: Payer: Medicare Other | Admitting: Family Medicine

## 2016-09-20 NOTE — Telephone Encounter (Signed)
Will you please fax [employer] form I filled out. Regarding depression diagnosis.

## 2016-09-21 NOTE — Telephone Encounter (Signed)
Paperwork was faxed on 09/20/16 and fax confirmation was received as well.

## 2017-10-04 IMAGING — CR DG FOOT COMPLETE 3+V*R*
3 series · 3 of 3 positions shown · non-contrast
Comparison: None.

CLINICAL DATA: Acute onset of right foot pain, especially at the
great toe, after fall. Initial encounter.

EXAM:
RIGHT FOOT COMPLETE - 3+ VIEW

[t foot lat right]
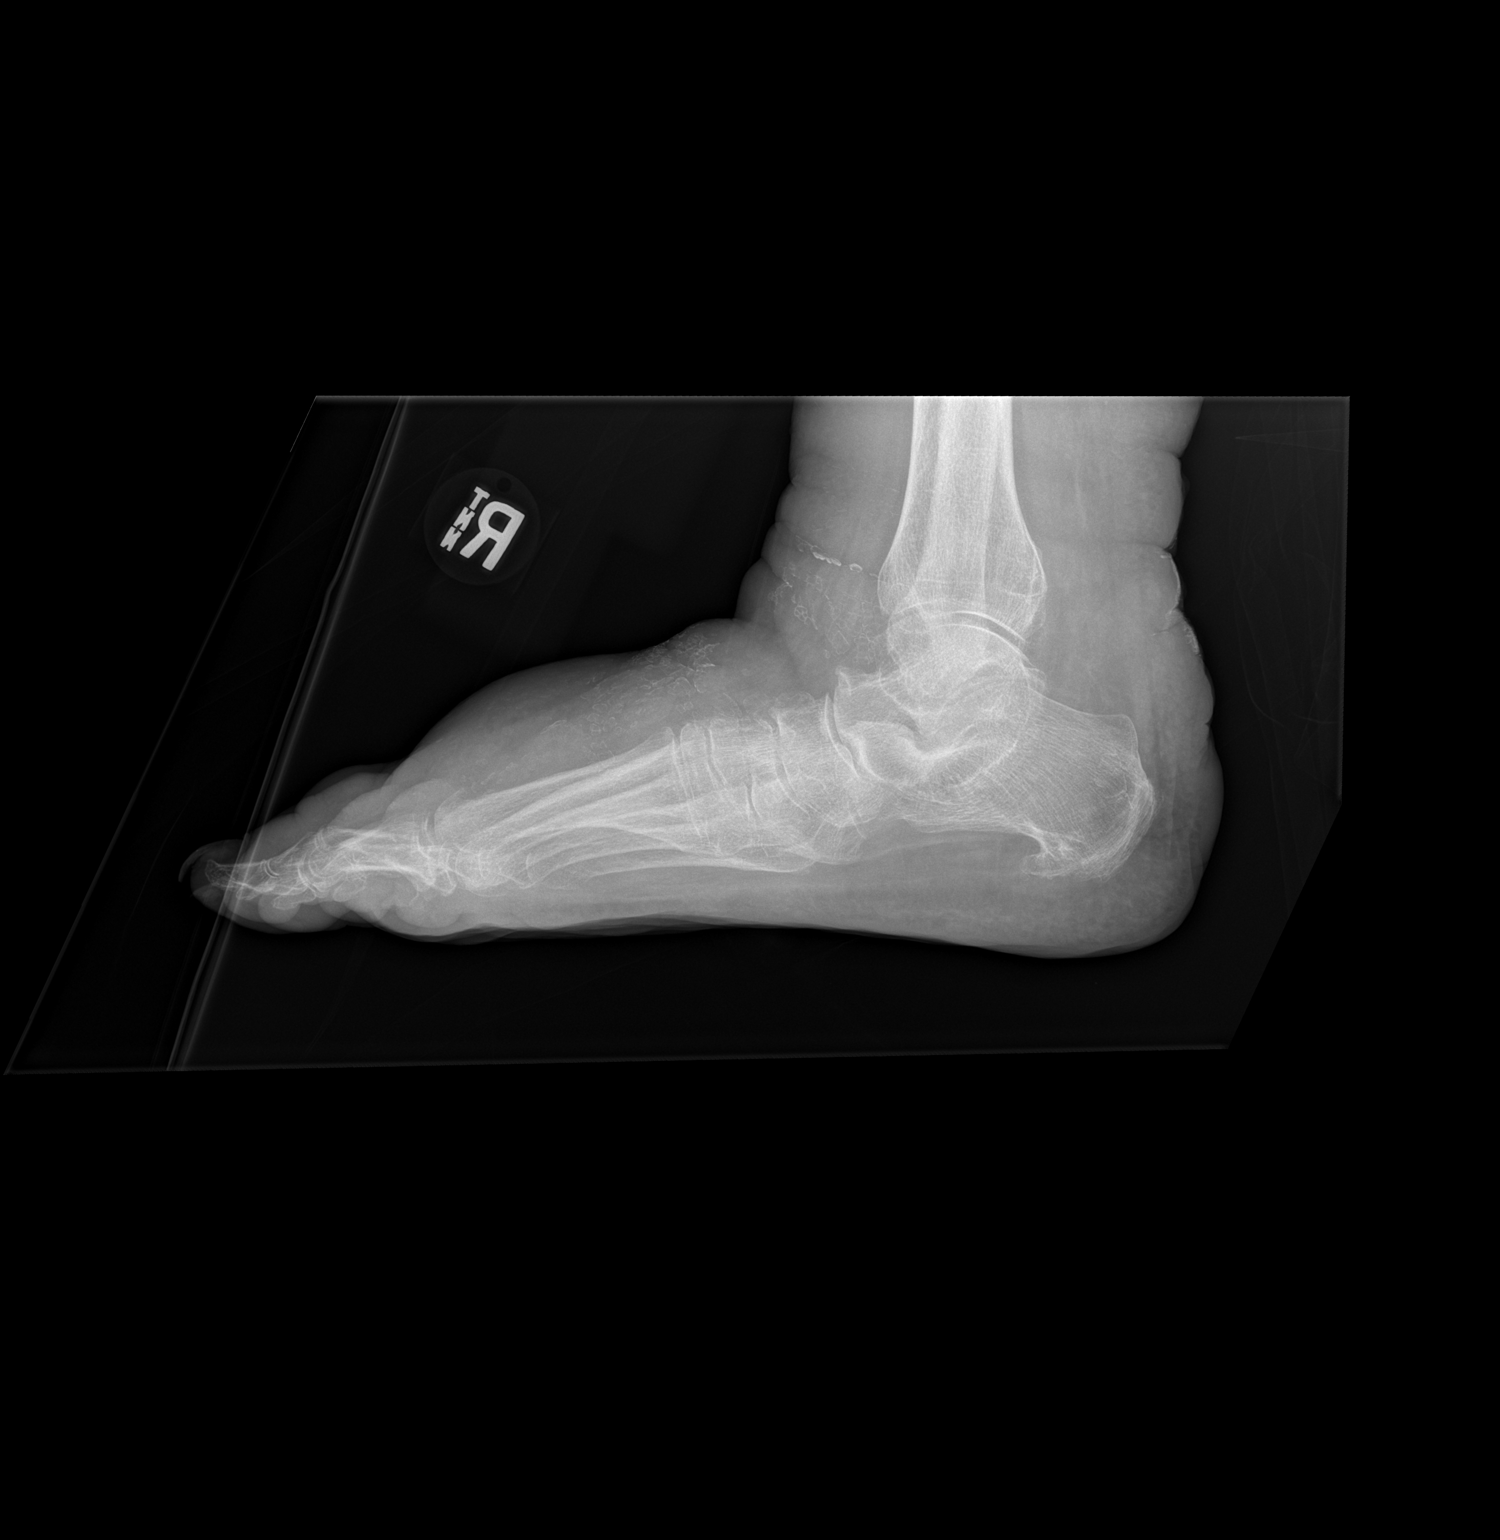

[x foot ap right]
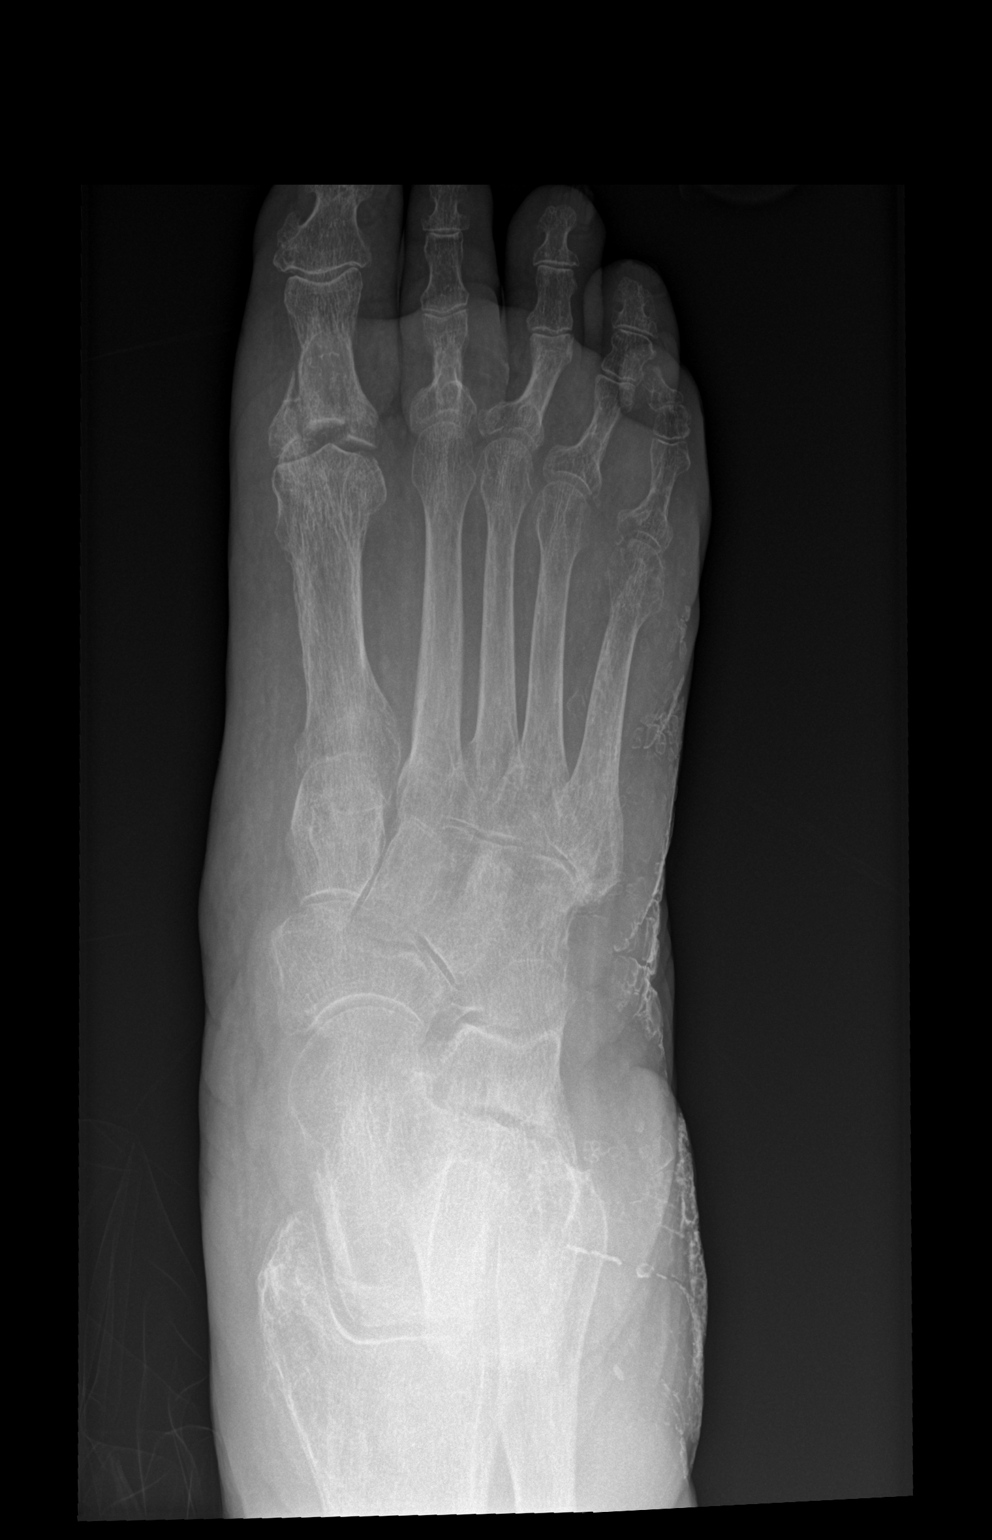

[x foot obl right]
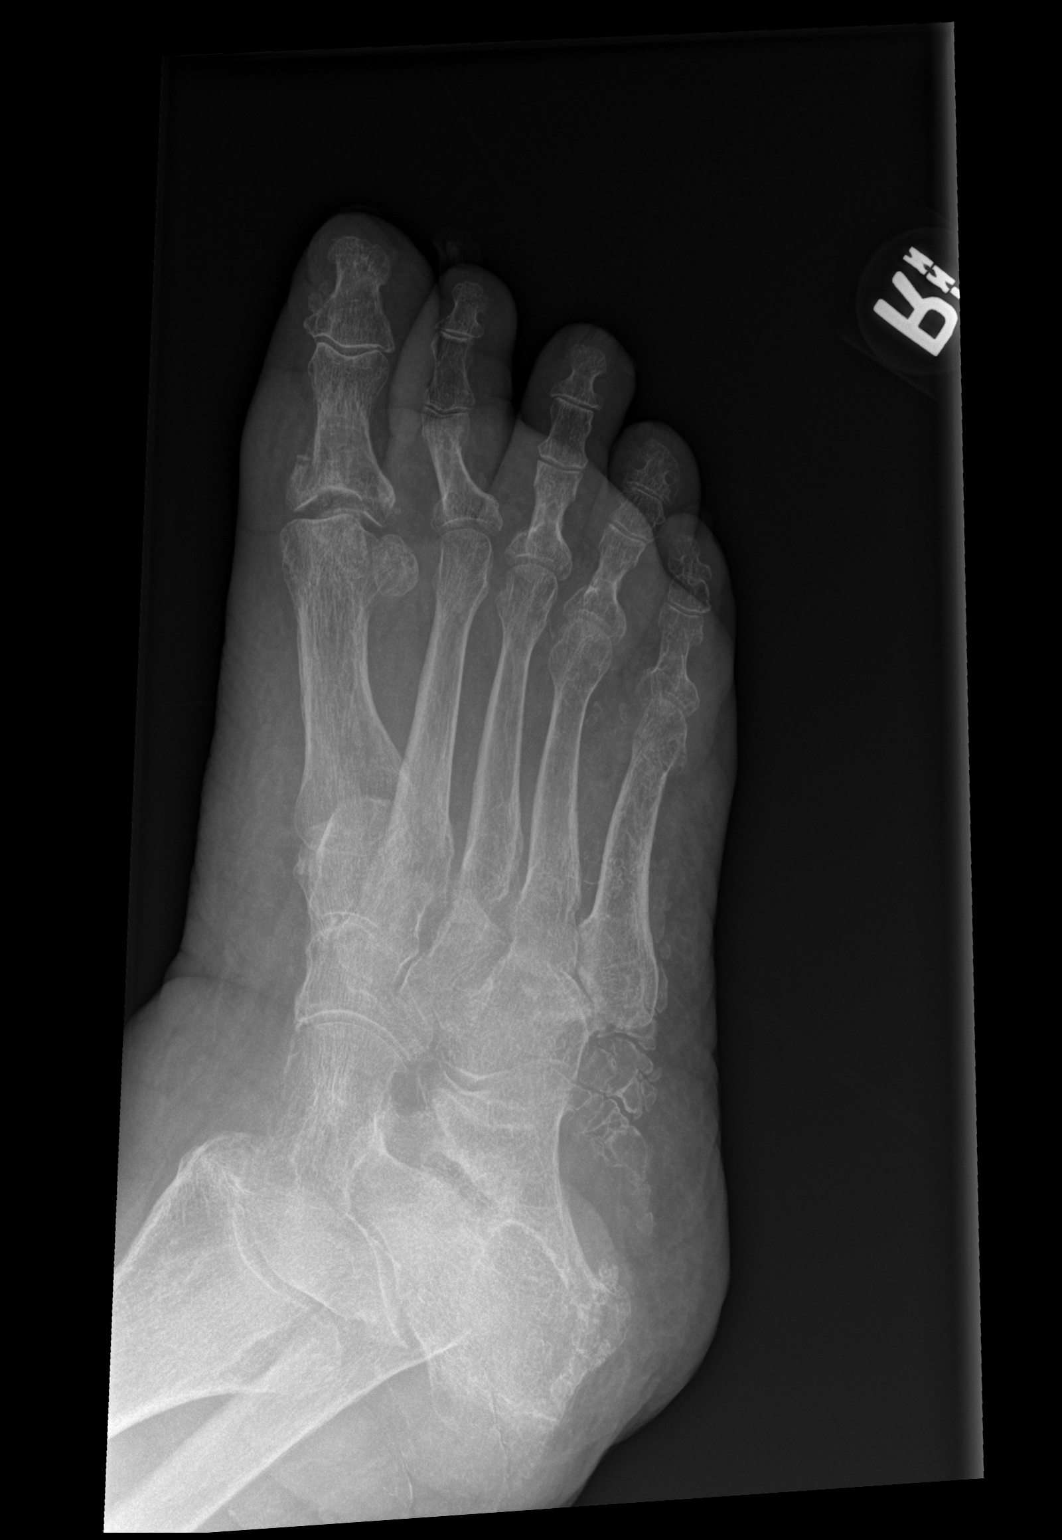

[3 of 3 positions shown; findings below may reference images not displayed]

FINDINGS: There is a comminuted fracture involving the first proximal phalanx,
with 2 fracture lines extending to the first metatarsophalangeal
joint, and mild impaction of a central fragment.

No additional fractures are seen. There is no evidence of talar
subluxation; the subtalar joint is unremarkable in appearance. A
plantar calcaneal spur is noted.

Diffuse dorsal soft tissue swelling is noted at the foot, with
scattered soft tissue calcifications.
IMPRESSION: 1. Comminuted fracture of the first proximal phalanx, with 2
fracture lines extending to the first metatarsophalangeal joint, and
mild impaction of a central fragment.
2. Diffuse dorsal soft tissue swelling, with scattered soft tissue
calcifications.

## 2017-10-05 IMAGING — DX DG CHEST 1V PORT
1 series · 1 of 1 positions shown · non-contrast
Comparison: Chest radiograph November 10, 2014

CLINICAL DATA: Generalized weakness for 2 weeks. Fell at home
tonight. Leg pain. History of hypertension, hyperlipidemia.

EXAM:
PORTABLE CHEST 1 VIEW

[chest ap]
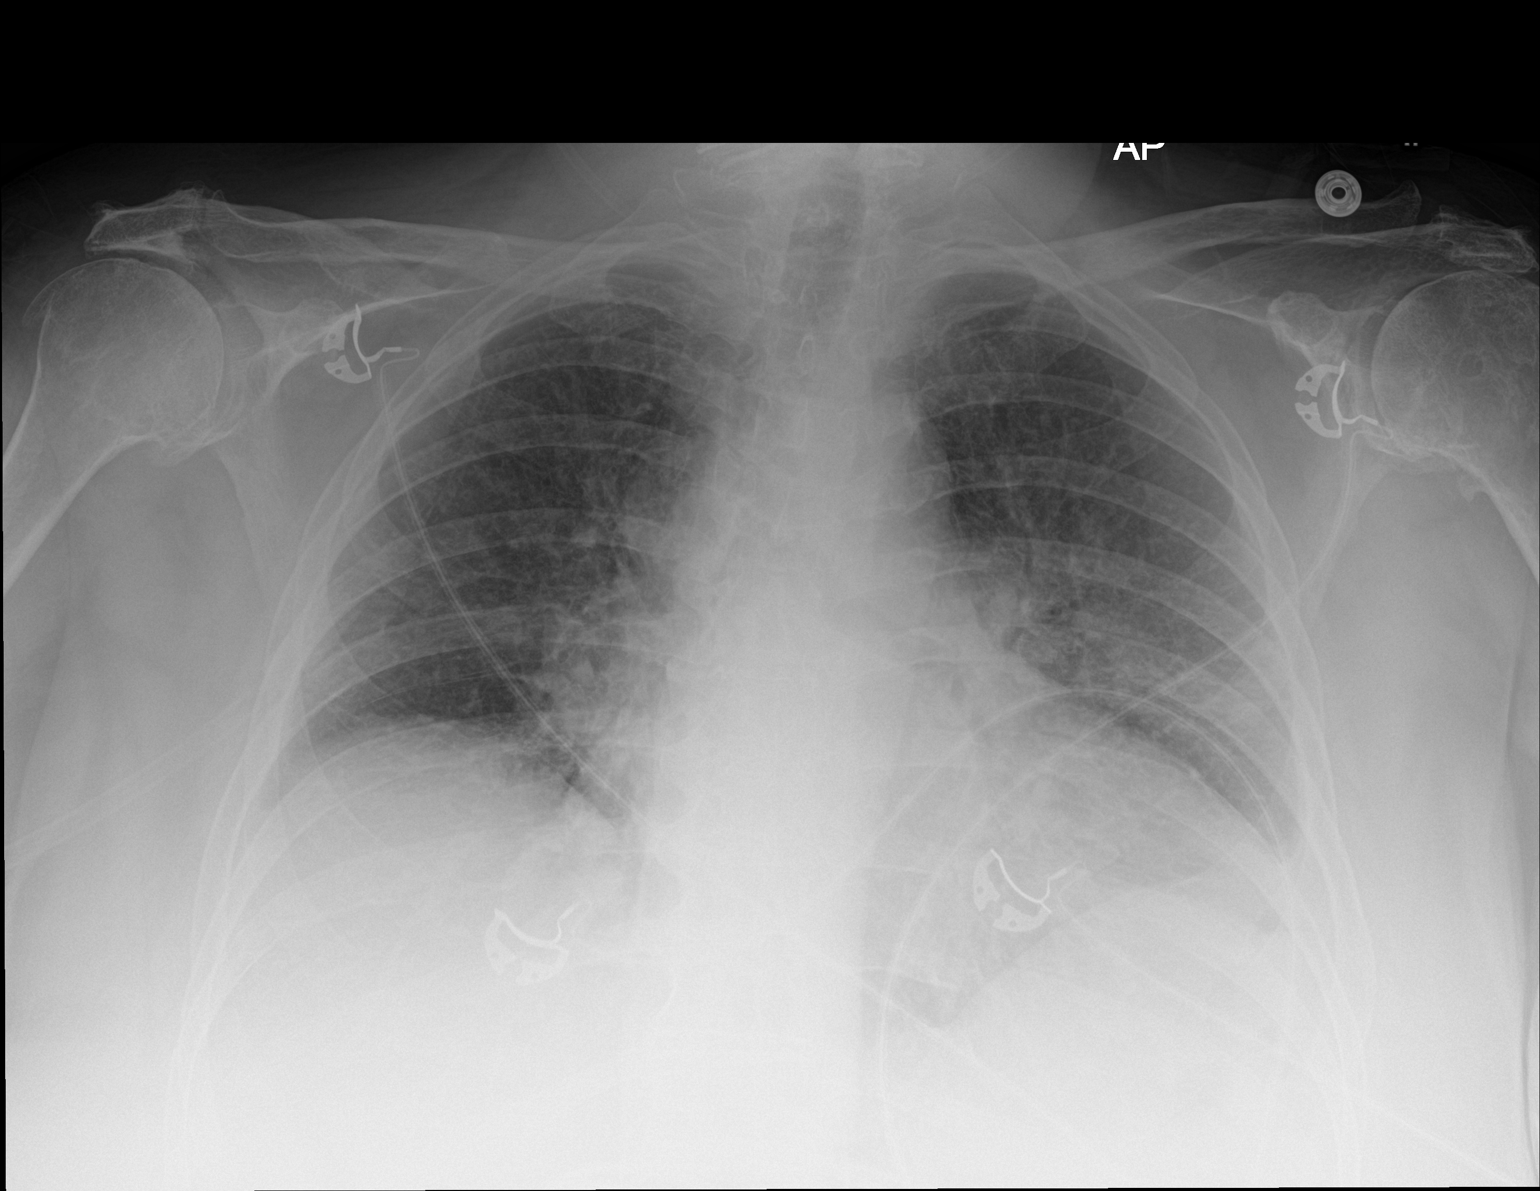

[1 of 1 positions shown; findings below may reference images not displayed]

FINDINGS: Cardiac silhouette is mildly enlarged, even with CAD considerations
low inspiratory examination. Pulmonary vascular congestion
interstitial prominence. Small bilateral pleural effusions. No
pneumothorax. Severe degenerative change of the shoulders.
IMPRESSION: Mild cardiomegaly.  Interstitial edema with small pleural effusions.

## 2017-10-08 IMAGING — DX DG CHEST 1V PORT
1 series · 2 of 2 positions shown · non-contrast
Comparison: 08/01/2016

CLINICAL DATA: F/u pneumonia; sob and wheezing

EXAM:
PORTABLE CHEST 1 VIEW

[Series 1: chest ap · 0.14mm/px · 2 of 2 slices shown]
[im 1/2]
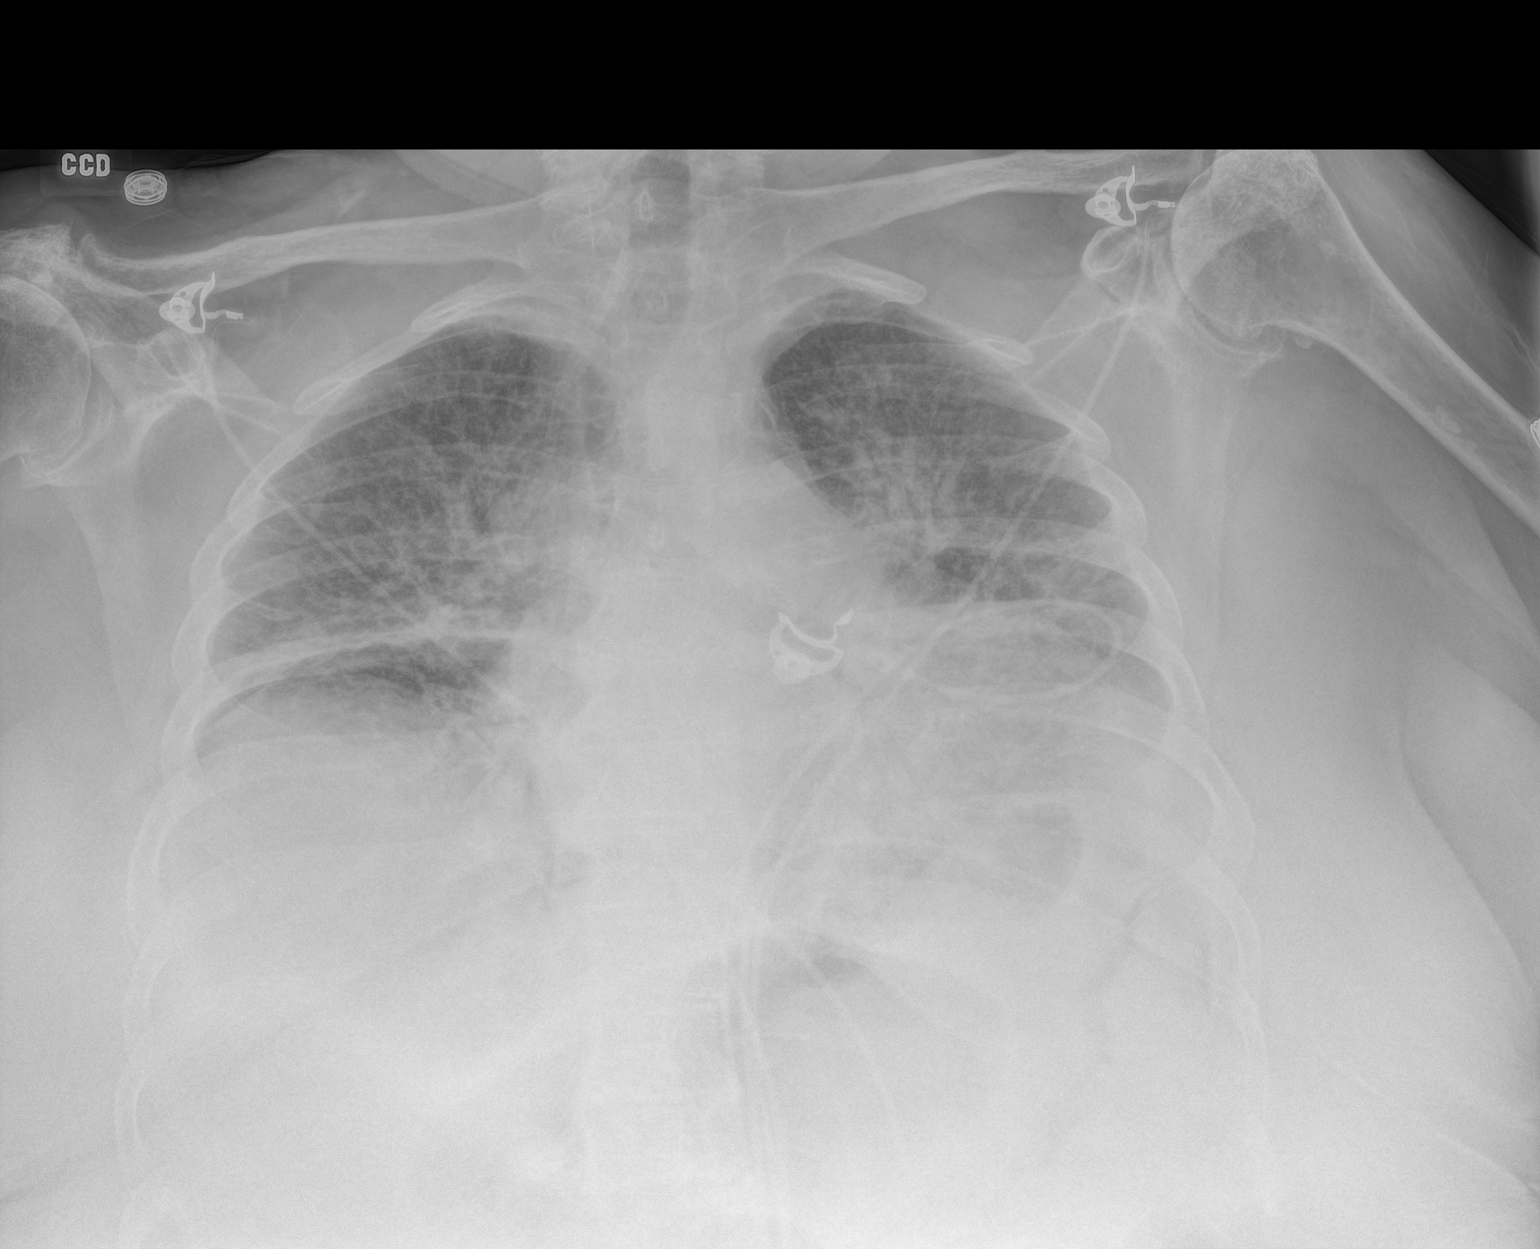
[im 2/2]
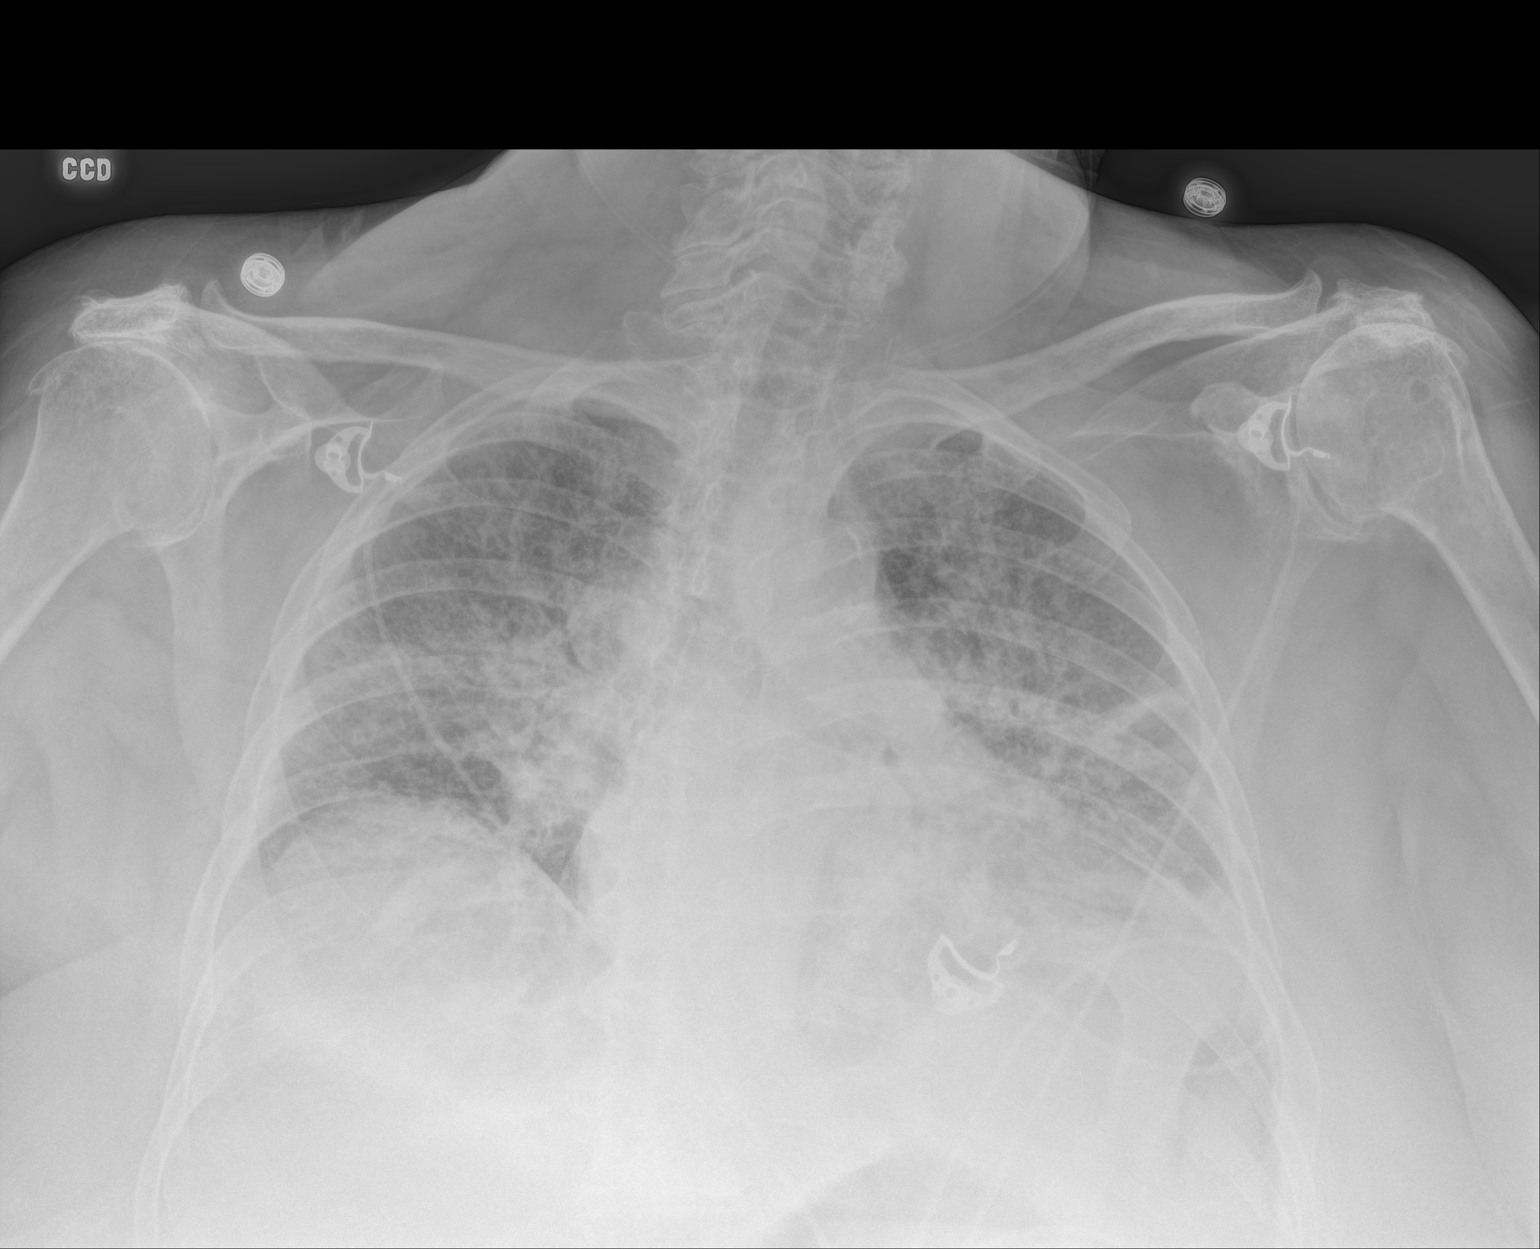

[2 of 2 positions shown; findings below may reference images not displayed]

FINDINGS: Exam is lordotic. Extremely low lung volumes. There is increased
perihilar vascular congestion. New perihilar airspace disease. No
pneumothorax
IMPRESSION: New perihilar airspace disease suggests pulmonary edema.

Low lung volumes the
